# Patient Record
Sex: Female | Born: 1985
Health system: Southern US, Community
[De-identification: ages and names within clinical notes are randomized; demographics above are authoritative.]

## PROBLEM LIST (undated history)

## (undated) ENCOUNTER — Inpatient Hospital Stay (HOSPITAL_COMMUNITY): Payer: Self-pay

## (undated) DIAGNOSIS — K219 Gastro-esophageal reflux disease without esophagitis: Secondary | ICD-10-CM

## (undated) DIAGNOSIS — M797 Fibromyalgia: Secondary | ICD-10-CM

## (undated) DIAGNOSIS — M542 Cervicalgia: Secondary | ICD-10-CM

## (undated) DIAGNOSIS — R1310 Dysphagia, unspecified: Secondary | ICD-10-CM

## (undated) DIAGNOSIS — G8929 Other chronic pain: Secondary | ICD-10-CM

## (undated) DIAGNOSIS — G43909 Migraine, unspecified, not intractable, without status migrainosus: Secondary | ICD-10-CM

## (undated) DIAGNOSIS — G970 Cerebrospinal fluid leak from spinal puncture: Secondary | ICD-10-CM

## (undated) DIAGNOSIS — G2581 Restless legs syndrome: Secondary | ICD-10-CM

## (undated) DIAGNOSIS — E0590 Thyrotoxicosis, unspecified without thyrotoxic crisis or storm: Secondary | ICD-10-CM

## (undated) DIAGNOSIS — E041 Nontoxic single thyroid nodule: Secondary | ICD-10-CM

## (undated) DIAGNOSIS — R0981 Nasal congestion: Secondary | ICD-10-CM

## (undated) DIAGNOSIS — J398 Other specified diseases of upper respiratory tract: Secondary | ICD-10-CM

## (undated) DIAGNOSIS — M1990 Unspecified osteoarthritis, unspecified site: Secondary | ICD-10-CM

## (undated) DIAGNOSIS — R350 Frequency of micturition: Secondary | ICD-10-CM

## (undated) DIAGNOSIS — G629 Polyneuropathy, unspecified: Secondary | ICD-10-CM

## (undated) DIAGNOSIS — M549 Dorsalgia, unspecified: Secondary | ICD-10-CM

## (undated) DIAGNOSIS — F419 Anxiety disorder, unspecified: Secondary | ICD-10-CM

## (undated) HISTORY — PX: DG MYLEOGRAM LUMBAR SPINE (ARMC HX): HXRAD1576

## (undated) HISTORY — DX: Thyrotoxicosis, unspecified without thyrotoxic crisis or storm: E05.90

## (undated) HISTORY — PX: TONSILLECTOMY: SUR1361

---
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
## 2003-01-02 ENCOUNTER — Emergency Department (HOSPITAL_COMMUNITY): Admission: EM | Admit: 2003-01-02 | Discharge: 2003-01-02 | Payer: Self-pay | Admitting: Internal Medicine

## 2003-01-02 ENCOUNTER — Encounter: Payer: Self-pay | Admitting: Internal Medicine

## 2003-10-01 ENCOUNTER — Ambulatory Visit (HOSPITAL_COMMUNITY): Admission: RE | Admit: 2003-10-01 | Discharge: 2003-10-01 | Payer: Self-pay | Admitting: *Deleted

## 2003-12-09 ENCOUNTER — Ambulatory Visit (HOSPITAL_COMMUNITY): Admission: AD | Admit: 2003-12-09 | Discharge: 2003-12-09 | Payer: Self-pay | Admitting: *Deleted

## 2008-04-20 ENCOUNTER — Emergency Department (HOSPITAL_COMMUNITY): Admission: EM | Admit: 2008-04-20 | Discharge: 2008-04-20 | Payer: Self-pay | Admitting: Emergency Medicine

## 2008-12-02 ENCOUNTER — Emergency Department (HOSPITAL_COMMUNITY): Admission: EM | Admit: 2008-12-02 | Discharge: 2008-12-02 | Payer: Self-pay | Admitting: Emergency Medicine

## 2010-12-02 LAB — URINE MICROSCOPIC-ADD ON

## 2010-12-02 LAB — URINALYSIS, ROUTINE W REFLEX MICROSCOPIC
Bilirubin Urine: NEGATIVE
Glucose, UA: NEGATIVE mg/dL
Hgb urine dipstick: NEGATIVE
Ketones, ur: NEGATIVE mg/dL
Leukocytes, UA: NEGATIVE
Nitrite: POSITIVE — AB
Protein, ur: NEGATIVE mg/dL
Specific Gravity, Urine: >1.03 — ABNORMAL HIGH (ref 1.005–1.030)
Urobilinogen, UA: 0.2 mg/dL (ref 0.0–1.0)
pH: 5.5 (ref 5.0–8.0)

## 2010-12-02 LAB — PREGNANCY, URINE: Preg Test, Ur: NEGATIVE

## 2010-12-02 LAB — HEMOGLOBIN AND HEMATOCRIT, BLOOD
HCT: 39.9 % (ref 36.0–46.0)
Hemoglobin: 13.6 g/dL (ref 12.0–15.0)

## 2011-01-08 NOTE — Discharge Summary (Signed)
   NAME:  Linda Reid, Linda Reid                      ACCOUNT NO.:  [id number]   MEDICAL RECORD NO.:  [id number]                   PATIENT TYPE:  OBV   LOCATION:  A418                                 FACILITY:  [facility]   PHYSICIAN:  Langley Gauss, M.D.                DATE OF BIRTH:  August 19, 1986   DATE OF ADMISSION:  06/25/2002  DATE OF DISCHARGE:  06/26/2002                                 DISCHARGE SUMMARY   DIAGNOSES:  1. Pregnancy at 12 weeks.  2. Spontaneous incomplete abortion.  3. Dilatation and curettage performed on June 25, 2002.   DISPOSITION:  The patient postoperatively continued on in observation status  and discharged to home on June 26, 2002.   PERTINENT LABORATORY DATA:  Admission hemoglobin and hematocrit 11.4/32/7  with a white count of 13.4.  Following surgery and equilibration following  blood loss, hemoglobin 10.3, hematocrit 30.7, with a white count of 14.5.   HOSPITAL COURSE:  The patient presented June 25, 2002 with spontaneous  incomplete abortion to the emergency room.  The patient has had no prenatal  care during this pregnancy; however, I had cared for her during a prior  miscarriage.  Thus, I was contacted.  By the time I arrived the patient had  already passed the 12-week size fetus.  However, she did require dilatation  and curettage in the OR for the remaining products of conception.  Postoperatively the patient did very well after administration of 0.2 mg of  IM Methergine in the operating room.  She had no complaints of significant  vaginal bleeding.  She was treated with IV Ancef preoperatively.  Postoperatively the patient was continued on observation status.  She  tolerated a regular general diet, had no excess bleeding.  Subsequently, she  was discharged to home on June 26, 2002.                                               Langley Gauss, M.D.    DC/MEDQ  D:  06/26/2002  T:  06/28/2002  Job:  [id number]

## 2011-01-08 NOTE — Discharge Summary (Signed)
NAME:  Linda Reid, Linda Reid                      ACCOUNT NO.:  [id number]   MEDICAL RECORD NO.:  [id number]                   PATIENT TYPE:  OIB   LOCATION:  A415                                 FACILITY:  [facility]   PHYSICIAN:  Langley Gauss, M.D.                DATE OF BIRTH:  1985/10/30   DATE OF ADMISSION:  12/09/2003  DATE OF DISCHARGE:  12/09/2003                                 DISCHARGE SUMMARY   TIME:  2300 hours   An 25 year old, gravida 3, para 0, two prior spontaneous ABs, at [redacted] weeks  gestation with findings of premature cervical dilatation.  Arrangements were  made and the patient is to be transferred to [facility] at this time  to have available a level 3 facility.   The chest complaint was onset of pelvic pressure today with subsequent  complaints of uterine tightening. She denies any change in vaginal  discharge, any vaginal bleeding, or any leakage of fluid. The patient  describes in the history that she was most recently was staying with  relatives in [place], [place]. The patient states that earlier in the same  day she had come to [facility] with these same complaints. She states  that she was seen there by an unknown MD and was sent home with a  prescription for p.o. terbutaline which the pharmacy was unable to fill. The  patient subsequently went home to bedrest. She complained of continued  pelvic cramping and tightening, so she came to [facility] for evaluation.   The patient's past medical history, past social history, and family history  are reviewed. The entire prenatal record is reviewed. No change from  previous evaluation in labor and delivery and notes pertinently from  previous transfer to [facility]. The patient was recently transferred  to [facility], November 12, 2003, with dilatation at 1 cm. History at that time  was consistent with incompetent cervix. The patient was tocolyzed there with  Indomethacin, hospitalized times two weeks  duration, and then was discharged  to home and placed at bedrest. I was notified by [facility] that the  patient was being discharged. The patient has not been seen for evaluation  since that discharge and this is my first contact with her since sending her  to [place].   PHYSICAL EXAMINATION:  The patient was complaining of pelvic pressure and  uterine tightening. Temperature 97.9, pulse 96, respiratory rate 19, blood  pressure 120/62. HEENT revealed the neck to be supple. Thyroid not palpable.  No significant adenopathy.  Lungs were clear. Cardiovascular exam revealed a  regular rate and rhythm. The abdomen was soft and nontender. The uterus was  nontender. A gravid uterus identified with a fundal height of 0.8 cm. Vertex  presentation by Leopold's maneuvers. Cervix 3 cm dilated, 70% effaced,  intact palpable membranes. The vertex was noted to be nonengaged. External  fetal monitor revealed fetal heart rate in the 150s. No  fetal heart rate  decelerations were noted. There was a rare uterine contraction seen by  external fetal monitor.   LABORATORY STUDIES:  None.   ASSESSMENT:  Premature cervical dilatation with occasional uterine  contractions now present.  The patient is at very high risk for preterm  delivery. The history previously noted with this patient has had history of  incompetent cervix. She has had two prior first trimester losses and has had  a dilatation and curettage performed.   PLAN:  [facility] spoke with Dr. Conni Slipper, and  arrangements are made to transfer the patient there for availability of the  level 3 nursery.     ___________________________________________                                         Langley Gauss, M.D.   DC/MEDQ  D:  12/15/2003  T:  12/16/2003  Job:  [id number]

## 2011-01-08 NOTE — Discharge Summary (Signed)
[facility]  Patient:    Linda Reid, Linda Reid Visit Number: [id number] MRN: [id number]          Service Type: OBV Location: LDR LDR2 01 Attending Physician:  Jeri Cos. Dictated by:   Langley Gauss, M.D. Admit Date:  01/10/2002 Discharge Date: 01/11/2002                             Discharge Summary  OBSERVATION STATUS  DIAGNOSIS:  Ten- to 12-week intrauterine pregnancy, presenting to the emergency room with diagnosis of threatened abortion.  During her observation status, the patient was noted to undergo a complete spontaneous abortion on the fourth floor.  HISTORY OF PRESENT ILLNESS:  [age]-year-old gravida 1, para 0, with a last menstrual period sometime the first part of March, but frequent skipped menses, who presented to [facility] Room as an unassigned patient. The patient was noted to have had a positive urine pregnancy test two to four weeks prior at an emergency room visit.  She now presented to the emergency room the p.m. of Jan 10, 2002 with the onset of spotting, changing to bright red bleeding on the date of evaluation.  The patient pertinently had been seen two to four weeks previously in the emergency room for back pain, at which time she was noted to have the initial positive urine pregnancy test.  She undoubtedly was given plans to follow up with a local practitioner.  She had not arranged for any plans for followup, thus she continues to be an unassigned patient and presents to the emergency room.  The patient was evaluated by the emergency room physician, at which time she was noted to have probably a miscarriage in progress.  I was contacted by phone and gave orders for the patient to be referred to the fourth floor for continued observation.  PAST MEDICAL HISTORY:  She has no other medical or surgical history.  ALLERGIES:  She has no known drug allergies.  CURRENT MEDICATIONS:  None.  PHYSICAL  EXAMINATION:  Black female in no acute distress.  Height is 5 feet 9 inches.  Weight is 138.  Blood pressure 131/52, pulse rate of 80, respiratory rate is 20.  HEENT:  Negative with no adenopathy.  NECK:  Supple. Thyroid is nonpalpable.  Mucous membranes are moist.  LUNGS:  Clear. CARDIOVASCULAR:  Regular rate and rhythm.  ABDOMEN:  Soft and nontender with no surgical scars identified.  No abdominal or pelvic masses are appreciated. PELVIC:  Exam reveals blood on the inner thighs, red in color with no clotting.  LABORATORY AND ACCESSORY DATA:  A transvaginal ultrasound had been performed on Jan 10, 2002 in the p.m. by the radiology staff which revealed no intrauterine pregnancy seen.  There was a clot in the lower uterine segment, no free fluid within the pelvis.  Diagnostic impression of the radiologist was that of miscarriage in progress, suggests clinical correlation.  The patient was noted to be Rh-positive blood type.  Quantitative beta hCG of 3685.  It was at this point that I was contacted for continued care and management.  The patient was transferred to the fourth floor for evaluation; this was on Jan 10, 2002.  HOSPITAL COURSE:  The patient was observed throughout the evening.  She continued to have mild cramping after performance of the transvaginal ultrasound but not significant; she was treated only with a single dose of 10 mg of p.o. Ambien.  The  patient continued to have light vaginal bleeding throughout the evening with minimal discomfort.  I then evaluated the patient on Jan 11, 2002 for necessity or proceeding with dilatation and curettage.  At that time, the patient was in no acute distress.  She has been up and ambulatory with only moderate vaginal bleeding, no passage of tissue, no additional passage of any clots.  She has complained of only mild cramping. Sterile speculum exam is performed which reveals a large piece of placental-appearing tissue which is within  the vagina itself.  This is removed in an intact manner and carefully examined.  Pertinently, no additional tissue is identified separate from this single portion removed and there is no tissue visible at the endocervical os.  Careful examination of this tissue reveals the attached placental trophoblastic-type tissue; likewise, a fluid-filled sac about 3 cm in diameter is identified which, upon careful examination, appears to contain a very ill-defined, poorly formed early embryo.  This is sent for permanent specimen only.  Examination of the genital tract reveals minimal vaginal bleeding, the cramping has completely subsided, tissue appears to have passed completely, thus the patient is continued on an observation status. She was treated with 0.2 mg of IM Methergine to involute the uterus; she, thereafter, had minimal vaginal bleeding, complained of cramping only after the IM Methergine, which was easily treated with IV Buprenex.  The patient was observed times several hours duration on the fourth floor.  She had no exacerbation of her bleeding and no significant cramping, thus final impression was that of complete spontaneous Ab, thus the patient is discharged to home on Jan 11, 2002.  FOLLOWUP:  She will follow up in one weeks time for discussion of birth control and future fertility options. Dictated by:   Langley Gauss, M.D. Attending Physician:  Jeri Cos. DD:  01/12/02 TD:  01/16/02 Job: [id number]

## 2011-01-08 NOTE — H&P (Signed)
   NAME:  Linda Reid, SCHWEBACH                      ACCOUNT NO.:  [id number]   MEDICAL RECORD NO.:  [id number]                   PATIENT TYPE:  OBV   LOCATION:  A418                                 FACILITY:  [facility]   PHYSICIAN:  Langley Gauss, M.D.                DATE OF BIRTH:  11/08/1985   DATE OF ADMISSION:  06/25/2002  DATE OF DISCHARGE:  06/26/2002                                HISTORY & PHYSICAL   HISTORY OF PRESENT ILLNESS:  This is an 25 year old gravida 2, para 0 with a  last menstrual period uncertain but around 04/06/02.  The patient has had no  prenatal care to date.  She presents to [facility] Emergency Room by  personal vehicle, complaining of onset of vaginal bleeding that a.m.  The  patient was aware that she was pregnant; had a positive pregnancy test two  weeks previously, but has received no prenatal care to date.   PAST MEDICAL HISTORY:  She had one prior spontaneous abortion, first  trimester, and not requiring D&C.  She had prior tonsillectomy and  adenoidectomy procedure performed.   ALLERGIES:  None.   CURRENT MEDICATIONS:  Prenatal vitamins.   PAST SURGICAL HISTORY:  No other hospitalizations; no other medical or  surgical history.   PHYSICAL EXAMINATION:  VITAL SIGNS:  Height:  5' 9, weight 130 lbs., blood  pressure 135/71, pulse 109, temperature 99, respirations 20.  HEENT:  Negative; no adenopathy.  NECK:  Supple; thyroid is nonpalpable.  LUNGS:  Clear.  CARDIOVASCULAR:  Sinus regular rate and rhythm.  ABDOMEN:  Soft and nontender; slightly enlarged uterus identified; no  significant pelvic masses.  The ovaries are nonpalpable.  PELVIC:  Prior to my arriving in the emergency room, the patient had  spontaneously passed what appeared to be a 12-week fetus.  This was sent off  for permanent specimen by the emergency room physician.  I was there after  consulting, and I consulted for continuing care, at which time I evaluated  the patient.  She was noted  to have moderate vaginal bleeding with some  clots present to posterior vaginal fornix, cervix noted to be dilated by 1  cm.; uterus noted to be 10 cm.   LABORATORY DATA:  Hemoglobin 11.   ASSESSMENT:  Twelve-week intrauterine pregnancy by best estimate.  The  patient has passed the fetus spontaneously; however, there are large amounts  of gestational tissue which remains within the uterine cavity; thus, the  patient was prepped to proceed with dilatation and curettage on 06/25/02.                                              Langley Gauss, M.D.   DC/MEDQ  D:  06/26/2002  T:  06/26/2002  Job:  [id number]

## 2011-01-08 NOTE — Op Note (Signed)
   NAME:  Linda Reid, Linda Reid                      ACCOUNT NO.:  [id number]   MEDICAL RECORD NO.:  [id number]                   PATIENT TYPE:  OBV   LOCATION:  A418                                 FACILITY:  [facility]   PHYSICIAN:  Langley Gauss, M.D.                DATE OF BIRTH:  [birth year]   DATE OF PROCEDURE:  06/25/2002  DATE OF DISCHARGE:  06/26/2002                                 OPERATIVE REPORT   PREOPERATIVE DIAGNOSES:  1. Spontaneous incomplete abortion [redacted] weeks gestation.  2. Insufficient prenatal care with no prenatal care to date.   POSTOPERATIVE DIAGNOSES:  Not given.   PROCEDURE:  Dilatation and curettage.   SURGEON:  Langley Gauss, M.D.   ESTIMATED BLOOD LOSS:  100 cc.   SPECIMENS:  Obvious products of conception to pathology for permanent  section only.   ANESTHESIA:  General endotracheal.   DESCRIPTION OF PROCEDURE:  The patient was taken to the operating room,  vital signs were stable. The patient underwent uncomplicated induction of  general anesthesia after which time she was placed in the dorsal lithotomy  position. A speculum was then used to visualize the vaginal vault. The  patient was sterilely prepped and draped in the usual manner. The cervix is  noted to be 1 cm dilated, clots are removed from the vagina as well as the  cervical os. Very gentle dilatation is then performed up to a size #19  dilator which allows passage of a banjo curette through the endocervix and  into the uterine cavity itself. The uterine cavity was then aggressively  curettaged with findings of a large amount of products of conception  obtained. No evidence of any irregularity of the anterior uterine contour or  any synechiae identified. The procedure was continued until no further  products of conception are obtained. A fine gritty sensation is appreciated  in all quadrants of the uterus to include the uterine fundus. The specimen  was then handed off for a permanent specimen  only. Bimanual examination  revealed a diffusely enlarged uterus 10 week size, cervix dilated 1 cm.  Patient treated at this time with 0.2 mg of IM methargen. Total estimated  blood loss less than 100 cc. The patient was then reversed of anesthesia at  which time operative findings discussed with the patient's awaiting family.                                               Langley Gauss, M.D.    DC/MEDQ  D:  06/26/2002  T:  06/27/2002  Job:  [id number]

## 2011-01-08 NOTE — Discharge Summary (Signed)
NAME:  Linda Reid, Linda Reid                      ACCOUNT NO.:  [id number]   MEDICAL RECORD NO.:  [id number]                   PATIENT TYPE:   LOCATION:                                       FACILITY:  [facility]   PHYSICIAN:  Langley Gauss, M.D.                DATE OF BIRTH:  31-Jul-1986   DATE OF ADMISSION:  DATE OF DISCHARGE:                                 DISCHARGE SUMMARY   TRANSFER SUMMARY:   HISTORY OF PRESENT ILLNESS:  This is a 25 year old gravida 3, para 0 who is  at 24-4/[redacted] weeks gestation.  Her last menstrual period is unreliable, but the  [facility] is based upon a 9-week crown rump length.  In addition, the patient had  an ultrasound performed at [facility] for anatomic survey which  confirmed this current estimated gestational age.  The patient came in the  office today for scheduled prenatal visit.  We had planned on performing a  Pap smear.  In addition, a GC and chlamydia culture were to be performed.  At the time of that examination, the patient was noted to be 1 cm dilated  with intact membranes visible at the cervix.  They were noted not to be  hourglassing.  GC and chlamydia cultures were obtained.  The patient had an  ultrasound done and arrangement is made for transfer.   LABORATORY DATA:  The patient's prenatal course, she is noted to be AB  positive blood type.  As stated previously, she had an ultrasound performed  at [facility] at [redacted] weeks gestation which revealed a normal  anatomic survey.  Breech presentation at that time, singleton pregnancy.  The cervix was measured and noted to have a length of 3.7 cm.  The remainder  of the patient's laboratory studies were within normal limits to include  normal maternal serum AFB screening.  Due to the patient's prior history of  two prior 12-week estimated gestational ages losses, the patient was treated  during the first trimester with a baby aspirin one p.o. day. In addition,  progesterone suppositories, 25  mg intravaginally b.i.d.  These were  discontinued at about [redacted] weeks gestation.   The patient pertinently has no complaints of pelvic cramping, pelvic  pressure, pelvic pain.  No history of menstrual type cramps.  No change in  vaginal discharge.  Specifically, no bleeding, no leakage of fluids.  No  brown discharge.  No increased mucus.  The patient was noted to have  negative GC and chlamydia cultures.   PAST MEDICAL HISTORY:  She was noted to have a positive chlamydia culture  September 24, 2002.  She was not pregnant at that time.  She was treated with  doxycycline 100 mg p.o. b.i.d. x7 as was her partner.  Subsequently, she has  been followed empirically as a high-risk patient.  She does have documented  negative GC and chlamydia cultures performed July 01, 2003.  These were  to be repeated again today.   ALLERGIES:  She has no known drug allergies.   CURRENT MEDICATIONS:  Prenatal  vitamins only.   PRIOR HISTORY:  Prior history June 25, 2002, the patient presented to  [facility] emergency room with no prenatal care at that time.  She was noted  to be a gravida 2, para 0.  She passed a gestational sac consistent with 12  weeks pregnancy, after significant amount of cramping, pain, discomfort and  some bleeding.  There remained tissue within the uterus.  Thus, a dilatation  and curettage was performed on June 25, 2002.  The operative procedure  and postoperative course were without complication.   The patient had a prior history of Jan 10, 2002.  The patient at that point  in time, had presented to the emergency room with no prenatal care, 10-[redacted]  weeks gestation.  The patient was noted to pass completely an intact  gestational sac, and placental-appearing tissue.  The patient was observed  overnight, had no further bleeding, and was discharged to home following  diagnosis of a complete spontaneous AB.  She did not receive a D&C during  that hospital visit.   SOCIAL  HISTORY:  The patient is a nonsmoker.  She is currently unemployed.  She has been restricting her activities largely to modified bed rest  throughout this pregnancy.   PHYSICAL EXAMINATION:  GENERAL:  The patient is in no acute distress.  VITAL SIGNS:  Weight 148, blood pressure 115/63, pulse rate 80, respiratory  rate 20.  HEENT:  Negative.  No adenopathy.  NECK:  Supple.  Thyroid is nonpalpable.  LUNGS:  Clear.  CARDIOVASCULAR:  Exam is a regular rate and rhythm.  ABDOMEN:  Soft, nontender.  Old surgical scars are identified.  She is  vertex presentation by Thayer Ohm maneuver.  Fetal heart tones were auscultated  in the 150's.  EXTREMITIES:  Noted to be normal.  PELVIC EXAM:  Normal external genitalia. No leakage of fluid.  No vaginal  bleeding.  No lesions are identified.  Sterile speculum examination is  performed.  We performed a Pap smear at which time, the cervix is noted to  be about 3 cm in length, but visually 1 cm dilated.  There are intact  membranes visible, but certainly these are not hourglassing, but are at  about the level of the internal cervical os.  GC and chlamydia cultures are  performed as well as Pap smear, and the speculum is withdrawn.  A limited OB  ultrasound, greater than [redacted] weeks gestation, is performed by Dr. Langley Gauss which reveals femoral length and abdominal circumference consistent  with 23 to 24-1/[redacted] weeks gestation.  BPD is measured at 26 weeks estimated  gestational age.  Subjectively, normal amniotic fluid volume, good fetal  movement is identified, as is fetal cardiac activity.  Placenta is noted to  be anterior in location, but not low lying.  There does appear to be some  opening of the internal cervical os measured at 1.9 cm in maximum width.   ASSESSMENT:  1. The patient with two prior 12-week losses which were associated with a     significant amount of cramping, and history was completely consistent    with 12-week spontaneous  abortion.  The patient was seen today for a     routine visit only, at which time she is noted to have cervix dilated 1     cm with no complaints of  uterine contraction.  Thus, consistent with a     diagnosis of incompetent cervix.  The patient is noted to be at the     threshold of viability at 24-4/[redacted] weeks gestation.  2. Thus, discussed with her transfer to [facility] Hospital, at which time     continuing care can be provided.  The patient is aware that at this     gestational age, should delivery occur, efforts will be made toward     resuscitation and stabilization of the infant.  She is, however, aware     that this is extreme prematurity.  3. I did discuss with her, however, that the longer the pregnancy     progresses, the better outcome can be expected.     ___________________________________________                                         Langley Gauss, M.D.   DC/MEDQ  D:  11/12/2003  T:  11/13/2003  Job:  [id number]

## 2014-08-23 NOTE — L&D Delivery Note (Signed)
Delivery Note Linda Reid is a 29 y.o. female [id number] with an uncomplicated IUP at [redacted]w[redacted]d who presented on 06/20/2015 for spontaneous onset of labor. She was assessed to be 7 cm on arrival, 9 cm by the time she arrived to the floor. She was subsequently admitted to the Labor & Delivery unit, and her membranes were artificially ruptured. She progressed in active labor without issue or any additional augmentation. At 1314 she delivered a healthy baby girl via uncomplicated spontaneous vaginal delivery (LOA). Cord clamping was delayed and third stage of labor proceeded as expected with spontaneous delivery of the placenta and subsequent hemostasis.  APGAR (1 MIN): 8   APGAR (5 MINS): 9   Weight: 7 lb 13.8 oz (3565 g) Placenta status: Spontaneous, intact Cord: 3 vessel Complications: none  Anesthesia: None Episiotomy: None Lacerations: R 1st degree Labial; L 2nd degree periurethral  Suture Repair: 4.0 vicryl Est. Blood Loss (mL): 225 cc   Mom to postpartum.  Baby to Couplet care / Skin to Skin.  Keane Scrape, MD 06/20/2015 1:50 PM   I was gloved and present for entire delivery Agree with note No difficulty with shoulders I supervised periurethral repair. No complications Seabron Spates, CNM

## 2014-11-14 ENCOUNTER — Other Ambulatory Visit: Payer: Self-pay | Admitting: *Deleted

## 2014-11-14 DIAGNOSIS — O3680X0 Pregnancy with inconclusive fetal viability, not applicable or unspecified: Secondary | ICD-10-CM

## 2014-11-18 ENCOUNTER — Ambulatory Visit (INDEPENDENT_AMBULATORY_CARE_PROVIDER_SITE_OTHER): Payer: Medicaid Other

## 2014-11-18 DIAGNOSIS — O3680X0 Pregnancy with inconclusive fetal viability, not applicable or unspecified: Secondary | ICD-10-CM | POA: Diagnosis not present

## 2014-11-18 NOTE — Progress Notes (Signed)
[name] :single IUP pos fht 151 bpm, [redacted]w[redacted]d,edd 07/02/2015,post pl,normal rt ov,lt corpus luteal cyst 2.5cm

## 2014-12-02 ENCOUNTER — Ambulatory Visit (INDEPENDENT_AMBULATORY_CARE_PROVIDER_SITE_OTHER): Payer: Medicaid Other | Admitting: Women's Health

## 2014-12-02 ENCOUNTER — Encounter: Payer: Self-pay | Admitting: Women's Health

## 2014-12-02 VITALS — BP 120/64 | HR 88 | Ht 69.0 in | Wt 167.0 lb

## 2014-12-02 DIAGNOSIS — O09219 Supervision of pregnancy with history of pre-term labor, unspecified trimester: Secondary | ICD-10-CM

## 2014-12-02 DIAGNOSIS — Z331 Pregnant state, incidental: Secondary | ICD-10-CM

## 2014-12-02 DIAGNOSIS — O09211 Supervision of pregnancy with history of pre-term labor, first trimester: Secondary | ICD-10-CM

## 2014-12-02 DIAGNOSIS — Z3682 Encounter for antenatal screening for nuchal translucency: Secondary | ICD-10-CM

## 2014-12-02 DIAGNOSIS — Z3491 Encounter for supervision of normal pregnancy, unspecified, first trimester: Secondary | ICD-10-CM

## 2014-12-02 DIAGNOSIS — Z369 Encounter for antenatal screening, unspecified: Secondary | ICD-10-CM

## 2014-12-02 DIAGNOSIS — O09891 Supervision of other high risk pregnancies, first trimester: Secondary | ICD-10-CM

## 2014-12-02 DIAGNOSIS — O09899 Supervision of other high risk pregnancies, unspecified trimester: Secondary | ICD-10-CM | POA: Insufficient documentation

## 2014-12-02 DIAGNOSIS — Z3490 Encounter for supervision of normal pregnancy, unspecified, unspecified trimester: Secondary | ICD-10-CM | POA: Insufficient documentation

## 2014-12-02 DIAGNOSIS — Z1389 Encounter for screening for other disorder: Secondary | ICD-10-CM

## 2014-12-02 LAB — POCT URINALYSIS DIPSTICK
GLUCOSE UA: NEGATIVE
Ketones, UA: NEGATIVE
Leukocytes, UA: NEGATIVE
Nitrite, UA: NEGATIVE
Protein, UA: NEGATIVE

## 2014-12-02 MED ORDER — DOXYLAMINE-PYRIDOXINE 10-10 MG PO TBEC: DELAYED_RELEASE_TABLET | ORAL | Status: DC

## 2014-12-02 NOTE — Patient Instructions (Signed)
Nausea & Vomiting  Have saltine crackers or pretzels by your bed and eat a few bites before you raise your head out of bed in the morning  Eat small frequent meals throughout the day instead of large meals  Drink plenty of fluids throughout the day to stay hydrated, just don't drink a lot of fluids with your meals.  This can make your stomach fill up faster making you feel sick  Do not brush your teeth right after you eat  Products with real ginger are good for nausea, like ginger ale and ginger hard candy Make sure it says made with real ginger!  Sucking on sour candy like lemon heads is also good for nausea  If your prenatal vitamins make you nauseated, take them at night so you will sleep through the nausea  Sea Bands  If you feel like you need medicine for the nausea & vomiting please let us know  If you are unable to keep any fluids or food down please let us know  For Dizzy Spells:   This is usually related to either your blood sugar or your blood pressure dropping  Make sure you are staying well hydrated and drinking enough water so that your urine is clear  Eat small frequent meals and snacks containing protein (meat, eggs, nuts, cheese) so that your blood sugar doesn't drop  If you do get dizzy, sit/lay down and get you something to drink and a snack containing protein- you will usually start feeling better in 10-20 minutes      First Trimester of Pregnancy The first trimester of pregnancy is from week 1 until the end of week 12 (months 1 through 3). A week after a sperm fertilizes an egg, the egg will implant on the wall of the uterus. This embryo will begin to develop into a baby. Genes from you and your partner are forming the baby. The female genes determine whether the baby is a boy or a girl. At 6-8 weeks, the eyes and face are formed, and the heartbeat can be seen on ultrasound. At the end of 12 weeks, all the baby's organs are formed.  Now that you are pregnant, you  will want to do everything you can to have a healthy baby. Two of the most important things are to get good prenatal care and to follow your health care provider's instructions. Prenatal care is all the medical care you receive before the baby's birth. This care will help prevent, find, and treat any problems during the pregnancy and childbirth. BODY CHANGES Your body goes through many changes during pregnancy. The changes vary from woman to woman.  15. You may gain or lose a couple of pounds at first. 16. You may feel sick to your stomach (nauseous) and throw up (vomit). If the vomiting is uncontrollable, call your health care provider. 17. You may tire easily. 71. You may develop headaches that can be relieved by medicines approved by your health care provider. 19. You may urinate more often. Painful urination may mean you have a bladder infection. 20. You may develop heartburn as a result of your pregnancy. 21. You may develop constipation because certain hormones are causing the muscles that push waste through your intestines to slow down. 22. You may develop hemorrhoids or swollen, bulging veins (varicose veins). 23. Your breasts may begin to grow larger and become tender. Your nipples may stick out more, and the tissue that surrounds them (areola) may become darker. 24. Your gums  may bleed and may be sensitive to brushing and flossing. 25. Dark spots or blotches (chloasma, mask of pregnancy) may develop on your face. This will likely fade after the baby is born. 26. Your menstrual periods will stop. 27. You may have a loss of appetite. 28. You may develop cravings for certain kinds of food. 39. You may have changes in your emotions from day to day, such as being excited to be pregnant or being concerned that something may go wrong with the pregnancy and baby. 37. You may have more vivid and strange dreams. 31. You may have changes in your hair. These can include thickening of your hair,  rapid growth, and changes in texture. Some women also have hair loss during or after pregnancy, or hair that feels dry or thin. Your hair will most likely return to normal after your baby is born. WHAT TO EXPECT AT YOUR PRENATAL VISITS During a routine prenatal visit:  You will be weighed to make sure you and the baby are growing normally.  Your blood pressure will be taken.  Your abdomen will be measured to track your baby's growth.  The fetal heartbeat will be listened to starting around week 10 or 12 of your pregnancy.  Test results from any previous visits will be discussed. Your health care provider may ask you:  How you are feeling.  If you are feeling the baby move.  If you have had any abnormal symptoms, such as leaking fluid, bleeding, severe headaches, or abdominal cramping.  If you have any questions. Other tests that may be performed during your first trimester include:  Blood tests to find your blood type and to check for the presence of any previous infections. They will also be used to check for low iron levels (anemia) and Rh antibodies. Later in the pregnancy, blood tests for diabetes will be done along with other tests if problems develop.  Urine tests to check for infections, diabetes, or protein in the urine.  An ultrasound to confirm the proper growth and development of the baby.  An amniocentesis to check for possible genetic problems.  Fetal screens for spina bifida and Down syndrome.  You may need other tests to make sure you and the baby are doing well. HOME CARE INSTRUCTIONS  Medicines  Follow your health care provider's instructions regarding medicine use. Specific medicines may be either safe or unsafe to take during pregnancy.  Take your prenatal vitamins as directed.  If you develop constipation, try taking a stool softener if your health care provider approves. Diet  Eat regular, well-balanced meals. Choose a variety of foods, such as meat  or vegetable-based protein, fish, milk and low-fat dairy products, vegetables, fruits, and whole grain breads and cereals. Your health care provider will help you determine the amount of weight gain that is right for you.  Avoid raw meat and uncooked cheese. These carry germs that can cause birth defects in the baby.  Eating four or five small meals rather than three large meals a day may help relieve nausea and vomiting. If you start to feel nauseous, eating a few soda crackers can be helpful. Drinking liquids between meals instead of during meals also seems to help nausea and vomiting.  If you develop constipation, eat more high-fiber foods, such as fresh vegetables or fruit and whole grains. Drink enough fluids to keep your urine clear or pale yellow. Activity and Exercise  Exercise only as directed by your health care provider. Exercising will help  you:  Control your weight.  Stay in shape.  Be prepared for labor and delivery.  Experiencing pain or cramping in the lower abdomen or low back is a good sign that you should stop exercising. Check with your health care provider before continuing normal exercises.  Try to avoid standing for long periods of time. Move your legs often if you must stand in one place for a long time.  Avoid heavy lifting.  Wear low-heeled shoes, and practice good posture.  You may continue to have sex unless your health care provider directs you otherwise. Relief of Pain or Discomfort  Wear a good support bra for breast tenderness.   Take warm sitz baths to soothe any pain or discomfort caused by hemorrhoids. Use hemorrhoid cream if your health care provider approves.   Rest with your legs elevated if you have leg cramps or low back pain.  If you develop varicose veins in your legs, wear support hose. Elevate your feet for 15 minutes, 3-4 times a day. Limit salt in your diet. Prenatal Care  Schedule your prenatal visits by the twelfth week of  pregnancy. They are usually scheduled monthly at first, then more often in the last 2 months before delivery.  Write down your questions. Take them to your prenatal visits.  Keep all your prenatal visits as directed by your health care provider. Safety  Wear your seat belt at all times when driving.  Make a list of emergency phone numbers, including numbers for family, friends, the hospital, and police and fire departments. General Tips  Ask your health care provider for a referral to a local prenatal education class. Begin classes no later than at the beginning of month 6 of your pregnancy.  Ask for help if you have counseling or nutritional needs during pregnancy. Your health care provider can offer advice or refer you to specialists for help with various needs.  Do not use hot tubs, steam rooms, or saunas.  Do not douche or use tampons or scented sanitary pads.  Do not cross your legs for long periods of time.  Avoid cat litter boxes and soil used by cats. These carry germs that can cause birth defects in the baby and possibly loss of the fetus by miscarriage or stillbirth.  Avoid all smoking, herbs, alcohol, and medicines not prescribed by your health care provider. Chemicals in these affect the formation and growth of the baby.  Schedule a dentist appointment. At home, brush your teeth with a soft toothbrush and be gentle when you floss. SEEK MEDICAL CARE IF:   You have dizziness.  You have mild pelvic cramps, pelvic pressure, or nagging pain in the abdominal area.  You have persistent nausea, vomiting, or diarrhea.  You have a bad smelling vaginal discharge.  You have pain with urination.  You notice increased swelling in your face, hands, legs, or ankles. SEEK IMMEDIATE MEDICAL CARE IF:   You have a fever.  You are leaking fluid from your vagina.  You have spotting or bleeding from your vagina.  You have severe abdominal cramping or pain.  You have rapid  weight gain or loss.  You vomit blood or material that looks like coffee grounds.  You are exposed to [name] measles and have never had them.  You are exposed to fifth disease or chickenpox.  You develop a severe headache.  You have shortness of breath.  You have any kind of trauma, such as from a fall or a car accident. Document  Released: 08/03/2001 Document Revised: 12/24/2013 Document Reviewed: 06/19/2013 [facility] Patient Information 2015 [place], [place]. This information is not intended to replace advice given to you by your health care provider. Make sure you discuss any questions you have with your health care provider.

## 2014-12-02 NOTE — Progress Notes (Signed)
  Subjective:  Linda Reid is a 29 y.o. [phone] African American female at [redacted]w[redacted]d by 7wk u/s, being seen today for her first obstetrical visit.  Her obstetrical history is significant for h/o ptb @ 25wks d/t PPROM, then 2 subsequent uncomplicated full term births.  Pregnancy history fully reviewed.  Patient reports n/v- requests meds, jittery feeling occ. Not eating well d/t n/v. Denies vb, cramping, uti s/s, abnormal/malodorous vag d/c, or vulvovaginal itching/irritation.  BP 120/64 mmHg  Pulse 88  Wt 167 lb (75.751 kg)  LMP 09/19/2014 (Exact Date)  HISTORY: OB History  Gravida Para Term Preterm AB SAB TAB Ectopic Multiple Living  6 3 2 1 2 2    3     # Outcome Date GA Lbr Len/2nd Weight Sex Delivery Anes PTL Lv  6 Current           5 Term 12/07/06 [redacted]w[redacted]d  7 lb 14 oz (3.572 kg) M Vag-Spont EPI N Y  4 Term 01/23/05 [redacted]w[redacted]d  7 lb 6 oz (3.345 kg) M Vag-Spont EPI N Y  3 Preterm 12/11/03 [redacted]w[redacted]d  2 lb (0.907 kg) F Vag-Spont EPI Y Y  2 SAB           1 SAB              History reviewed. No pertinent past medical history. Past Surgical History  Procedure Laterality Date  . Tonsillectomy     Family History  Problem Relation Age of Onset  . Diabetes Maternal Aunt   . Hypertension Maternal Aunt   . Stroke Maternal Aunt   . Diabetes Maternal Uncle   . Hypertension Maternal Uncle   . Hypertension Paternal Aunt   . Hypertension Paternal Uncle   . Cancer Maternal Grandmother     breast  . Diabetes Maternal Grandmother   . Hypertension Maternal Grandmother   . Heart disease Maternal Grandfather   . Hypertension Maternal Grandfather   . Hypertension Paternal Grandmother   . Hypertension Paternal Grandfather     Exam   System:     General: Well developed & nourished, no acute distress   Skin: Warm & dry, normal coloration and turgor, no rashes   Neurologic: Alert & oriented, normal mood   Cardiovascular: Regular rate & rhythm   Respiratory: Effort & rate normal, LCTAB, acyanotic    Abdomen: Soft, non tender   Extremities: normal strength, tone  Thin prep pap smear 2015 in [place]- neg per pt  FHR: 163 via doppler   Assessment:   Pregnancy: [id number] Patient Active Problem List   Diagnosis Date Noted  . Supervision of normal pregnancy 12/02/2014    Priority: High  . History of preterm delivery, currently pregnant 12/02/2014    Priority: High    [redacted]w[redacted]d [id number] New OB visit N/v of pregnancy H/o spontaneous ptb d/t pprom at 25wks   Plan:  Initial labs drawn Continue prenatal vitamins Problem list reviewed and updated Reviewed n/v relief measures and warning s/s to report Rx diclegis, prior auth sent, and 2 samples given.  Reviewed recommended weight gain based on pre-gravid BMI Encouraged well-balanced diet Genetic Screening discussed Integrated Screen: requested Cystic fibrosis screening discussed requested Ultrasound discussed; fetal survey: requested Follow up in 3 weeks for 1st it/nt and visit [place] [place] completed Liliane Bade, accepted, ordered today  Tawnya Crook CNM, [facility] 12/02/2014 12:24 PM

## 2014-12-04 ENCOUNTER — Telehealth: Payer: Self-pay | Admitting: Women's Health

## 2014-12-04 ENCOUNTER — Encounter: Payer: Self-pay | Admitting: Women's Health

## 2014-12-04 DIAGNOSIS — O98811 Other maternal infectious and parasitic diseases complicating pregnancy, first trimester: Secondary | ICD-10-CM

## 2014-12-04 DIAGNOSIS — A749 Chlamydial infection, unspecified: Secondary | ICD-10-CM | POA: Insufficient documentation

## 2014-12-04 LAB — GC/CHLAMYDIA PROBE AMP
CHLAMYDIA, DNA PROBE: POSITIVE — AB
Neisseria gonorrhoeae by PCR: NEGATIVE

## 2014-12-04 LAB — URINE CULTURE: Organism ID, Bacteria: NO GROWTH

## 2014-12-04 MED ORDER — AZITHROMYCIN 500 MG PO TABS: 1000.0000 mg | ORAL_TABLET | Freq: Once | ORAL | Status: DC

## 2014-12-04 NOTE — Telephone Encounter (Signed)
Notified pt of +CT, rx sent to her pharmacy. Rx for partner Amylia Collazos, dob 02/11/84, nkda, sent to [facility]. No sex x at least 7d from both taking meds. Will do POC on pt in 4wks.  Roma Schanz, CNM, WHNP-BC 12/04/2014 2:30 PM

## 2014-12-06 LAB — PMP SCREEN PROFILE (10S), URINE
Amphetamine Screen, Ur: NEGATIVE ng/mL
Barbiturate Screen, Ur: NEGATIVE ng/mL
Benzodiazepine Screen, Urine: NEGATIVE ng/mL
COCAINE(METAB.) SCREEN, URINE: NEGATIVE ng/mL
Cannabinoids Ur Ql Scn: NEGATIVE ng/mL
Creatinine(Crt), U: 248.7 mg/dL (ref 20.0–300.0)
METHADONE SCREEN, URINE: NEGATIVE ng/mL
OPIATE SCRN UR: NEGATIVE ng/mL
OXYCODONE+OXYMORPHONE UR QL SCN: NEGATIVE ng/mL
PCP Scrn, Ur: NEGATIVE ng/mL
PROPOXYPHENE SCREEN: NEGATIVE ng/mL
Ph of Urine: 7.1 (ref 4.5–8.9)

## 2014-12-06 LAB — ABO/RH: Rh Factor: POSITIVE

## 2014-12-06 LAB — RUBELLA SCREEN: Rubella Antibodies, IGG: 1.07 index (ref >0.99)

## 2014-12-06 LAB — CBC
HEMATOCRIT: 40.4 % (ref 34.0–46.6)
HEMOGLOBIN: 13.2 g/dL (ref 11.1–15.9)
MCH: 30.6 pg (ref 26.6–33.0)
MCHC: 32.7 g/dL (ref 31.5–35.7)
MCV: 94 fL (ref 79–97)
Platelets: 264 10*3/uL (ref 150–379)
RBC: 4.31 x10E6/uL (ref 3.77–5.28)
RDW: 13.2 % (ref 12.3–15.4)
WBC: 8.9 10*3/uL (ref 3.4–10.8)

## 2014-12-06 LAB — ANTIBODY SCREEN: ANTIBODY SCREEN: NEGATIVE

## 2014-12-06 LAB — URINALYSIS, ROUTINE W REFLEX MICROSCOPIC
Bilirubin, UA: NEGATIVE
GLUCOSE, UA: NEGATIVE
LEUKOCYTES UA: NEGATIVE
NITRITE UA: NEGATIVE
RBC UA: NEGATIVE
SPEC GRAV UA: 1.028 (ref 1.005–1.030)
Urobilinogen, Ur: 1 mg/dL (ref 0.2–1.0)
pH, UA: 7 (ref 5.0–7.5)

## 2014-12-06 LAB — SICKLE CELL SCREEN: Sickle Cell Screen: NEGATIVE

## 2014-12-06 LAB — RPR: RPR: NONREACTIVE

## 2014-12-06 LAB — VARICELLA ZOSTER ANTIBODY, IGG: VARICELLA: 1612 {index} (ref >165)

## 2014-12-06 LAB — HEPATITIS B SURFACE ANTIGEN: Hepatitis B Surface Ag: NEGATIVE

## 2014-12-06 LAB — CYSTIC FIBROSIS MUTATION 97: Interpretation: NOT DETECTED

## 2014-12-06 LAB — HIV ANTIBODY (ROUTINE TESTING W REFLEX): HIV Screen 4th Generation wRfx: NONREACTIVE

## 2014-12-26 ENCOUNTER — Ambulatory Visit (INDEPENDENT_AMBULATORY_CARE_PROVIDER_SITE_OTHER): Payer: Medicaid Other

## 2014-12-26 ENCOUNTER — Ambulatory Visit (INDEPENDENT_AMBULATORY_CARE_PROVIDER_SITE_OTHER): Payer: Medicaid Other | Admitting: Advanced Practice Midwife

## 2014-12-26 VITALS — BP 102/62 | HR 74 | Wt 165.0 lb

## 2014-12-26 DIAGNOSIS — Z36 Encounter for antenatal screening of mother: Secondary | ICD-10-CM

## 2014-12-26 DIAGNOSIS — Z369 Encounter for antenatal screening, unspecified: Secondary | ICD-10-CM

## 2014-12-26 DIAGNOSIS — Z331 Pregnant state, incidental: Secondary | ICD-10-CM

## 2014-12-26 DIAGNOSIS — Z3682 Encounter for antenatal screening for nuchal translucency: Secondary | ICD-10-CM

## 2014-12-26 DIAGNOSIS — Z1389 Encounter for screening for other disorder: Secondary | ICD-10-CM

## 2014-12-26 DIAGNOSIS — Z3492 Encounter for supervision of normal pregnancy, unspecified, second trimester: Secondary | ICD-10-CM

## 2014-12-26 DIAGNOSIS — O283 Abnormal ultrasonic finding on antenatal screening of mother: Secondary | ICD-10-CM

## 2014-12-26 LAB — POCT URINALYSIS DIPSTICK
Glucose, UA: NEGATIVE
Ketones, UA: NEGATIVE
Leukocytes, UA: NEGATIVE
NITRITE UA: NEGATIVE
PROTEIN UA: NEGATIVE
RBC UA: NEGATIVE

## 2014-12-26 NOTE — Progress Notes (Signed)
NT/NB Ultrasound completed today.  Single, active fetus with FHR of 168 bpm.  CRL measures 74.45mm which is consistent with dating. Nasal bone is present.  NT appears thickened at 3.67mm.  Bilateral ovaries appear normal.  Small fibroid noted in right uterus measuring 9 x 9 x 6 mm.  Manus Gunning to see patient.

## 2014-12-26 NOTE — Progress Notes (Signed)
[id number] [redacted]w[redacted]d Estimated Date of Delivery: 07/02/15  Blood pressure 102/62, pulse 74, weight 165 lb (74.844 kg), last menstrual period 09/19/2014.   BP weight and urine results all reviewed and noted.  Please refer to the obstetrical flow sheet for the fundal height and fetal heart rate documentation:NT/NB Ultrasound completed today. Single, active fetus with FHR of 168 bpm. CRL measures 74.18mm which is consistent with dating. Nasal bone is present. NT appears thickened at 3.78mm. Bilateral ovaries appear normal. Small fibroid noted in right uterus measuring 9 x 9 x 6 mm  Patient denies any bleeding and no rupture of membranes symptoms or regular contractions. Patient is without complaints. All questions were answered.  Plan:  Continued routine obstetrical care, CFDNA testing today  Follow up in 4 weeks for OB appointment,

## 2015-01-01 ENCOUNTER — Telehealth: Payer: Self-pay | Admitting: *Deleted

## 2015-01-01 NOTE — Telephone Encounter (Signed)
Pt informed the InformaSeq results still pending from 12/26/2014.

## 2015-01-02 ENCOUNTER — Encounter: Payer: Self-pay | Admitting: Advanced Practice Midwife

## 2015-01-02 LAB — INFORMASEQ(SM) WITH XY ANALYSIS
Fetal Fraction (%):: 10.4
Fetal Number: 1
Gestational Age at Collection: 13.1 weeks
WEIGHT: 165 [lb_av]

## 2015-01-02 NOTE — Addendum Note (Signed)
Addended by: Christin Fudge on: 01/02/2015 09:19 PM   Modules accepted: Orders

## 2015-01-04 ENCOUNTER — Inpatient Hospital Stay (HOSPITAL_COMMUNITY)
Admission: EM | Admit: 2015-01-04 | Discharge: 2015-01-05 | Disposition: A | Discharge status: Home / Self Care | Payer: Medicaid Other | Attending: Obstetrics & Gynecology | Admitting: Obstetrics & Gynecology

## 2015-01-04 ENCOUNTER — Encounter (HOSPITAL_COMMUNITY): Payer: Self-pay | Admitting: Emergency Medicine

## 2015-01-04 DIAGNOSIS — O283 Abnormal ultrasonic finding on antenatal screening of mother: Secondary | ICD-10-CM

## 2015-01-04 DIAGNOSIS — Z3A14 14 weeks gestation of pregnancy: Secondary | ICD-10-CM | POA: Diagnosis not present

## 2015-01-04 DIAGNOSIS — O209 Hemorrhage in early pregnancy, unspecified: Secondary | ICD-10-CM

## 2015-01-04 DIAGNOSIS — Z3491 Encounter for supervision of normal pregnancy, unspecified, first trimester: Secondary | ICD-10-CM

## 2015-01-04 DIAGNOSIS — O26852 Spotting complicating pregnancy, second trimester: Secondary | ICD-10-CM | POA: Diagnosis not present

## 2015-01-04 DIAGNOSIS — Z3490 Encounter for supervision of normal pregnancy, unspecified, unspecified trimester: Secondary | ICD-10-CM

## 2015-01-04 LAB — CBC WITH DIFFERENTIAL/PLATELET
Basophils Absolute: 0 10*3/uL (ref 0.0–0.1)
Basophils Relative: 0 % (ref 0–1)
EOS ABS: 0.2 10*3/uL (ref 0.0–0.7)
Eosinophils Relative: 2 % (ref 0–5)
HEMATOCRIT: 35.1 % — AB (ref 36.0–46.0)
HEMOGLOBIN: 12.1 g/dL (ref 12.0–15.0)
LYMPHS ABS: 2.1 10*3/uL (ref 0.7–4.0)
LYMPHS PCT: 21 % (ref 12–46)
MCH: 31.4 pg (ref 26.0–34.0)
MCHC: 34.5 g/dL (ref 30.0–36.0)
MCV: 91.2 fL (ref 78.0–100.0)
MONO ABS: 0.8 10*3/uL (ref 0.1–1.0)
Monocytes Relative: 8 % (ref 3–12)
Neutro Abs: 7.1 10*3/uL (ref 1.7–7.7)
Neutrophils Relative %: 69 % (ref 43–77)
Platelets: 212 10*3/uL (ref 150–400)
RBC: 3.85 MIL/uL — AB (ref 3.87–5.11)
RDW: 12.2 % (ref 11.5–15.5)
WBC: 10.2 10*3/uL (ref 4.0–10.5)

## 2015-01-04 LAB — URINALYSIS, ROUTINE W REFLEX MICROSCOPIC
Bilirubin Urine: NEGATIVE
Glucose, UA: NEGATIVE mg/dL
HGB URINE DIPSTICK: NEGATIVE
Ketones, ur: NEGATIVE mg/dL
Leukocytes, UA: NEGATIVE
NITRITE: NEGATIVE
Protein, ur: NEGATIVE mg/dL
Specific Gravity, Urine: 1.025 (ref 1.005–1.030)
Urobilinogen, UA: 1 mg/dL (ref 0.0–1.0)
pH: 6 (ref 5.0–8.0)

## 2015-01-04 LAB — BASIC METABOLIC PANEL
ANION GAP: 6 (ref 5–15)
BUN: 12 mg/dL (ref 6–20)
CALCIUM: 8.4 mg/dL — AB (ref 8.9–10.3)
CO2: 23 mmol/L (ref 22–32)
Chloride: 107 mmol/L (ref 101–111)
Creatinine, Ser: 0.61 mg/dL (ref 0.44–1.00)
Glucose, Bld: 88 mg/dL (ref 65–99)
POTASSIUM: 3.4 mmol/L — AB (ref 3.5–5.1)
SODIUM: 136 mmol/L (ref 135–145)

## 2015-01-04 MED ORDER — SODIUM CHLORIDE 0.9 % IV BOLUS (SEPSIS)
1000.0000 mL | Freq: Once | INTRAVENOUS | Status: AC
  Administered 2015-01-04: 1000 mL via INTRAVENOUS

## 2015-01-04 NOTE — ED Notes (Signed)
Patient reports is approximately [redacted] weeks pregnant. States history of early deliveries and problems with pregnancy. States she went to the bathroom and noticed small amount of dark blood when she wiped.

## 2015-01-04 NOTE — ED Notes (Signed)
Pelvic exam and I&O cath being completed by Dr. Lacinda Axon; assisted by Elon Spanner, RN.

## 2015-01-04 NOTE — ED Provider Notes (Signed)
CSN: [id number]     Arrival date & time 01/04/15  2133 History  This chart was scribed for Nat Christen, MD by Jeanell Sparrow, ED Scribe. This patient was seen in room APA01/APA01 and the patient's care was started at 9:58 PM.  Chief Complaint  Patient presents with  . Vaginal Bleeding   The history is provided by the patient. No language interpreter was used.   HPI Comments: Linda Reid is a 29 y.o. female who is G6P3AB2 LMP 09/19/14 presents to the Emergency Department complaining of constant moderate vaginal bleeding that started today. She states that she is currently [redacted] weeks pregnant. She report that she is unsure whether the source of bleeding is cervical or urethral. She denies any abdominal pain, dysuria, flank pain, fever, chills.   OB/GYN- [facility]  History reviewed. No pertinent past medical history. Past Surgical History  Procedure Laterality Date  . Tonsillectomy     Family History  Problem Relation Age of Onset  . Diabetes Maternal Aunt   . Hypertension Maternal Aunt   . Stroke Maternal Aunt   . Diabetes Maternal Uncle   . Hypertension Maternal Uncle   . Hypertension Paternal Aunt   . Hypertension Paternal Uncle   . Cancer Maternal Grandmother     breast  . Diabetes Maternal Grandmother   . Hypertension Maternal Grandmother   . Heart disease Maternal Grandfather   . Hypertension Maternal Grandfather   . Hypertension Paternal Grandmother   . Hypertension Paternal Grandfather    History  Substance Use Topics  . Smoking status: Never Smoker   . Smokeless tobacco: Never Used  . Alcohol Use: No   OB History    Gravida Para Term Preterm AB TAB SAB Ectopic Multiple Living   6 3 2 1 2  2   3      Review of Systems A complete 10 system review of systems was obtained and all systems are negative except as noted in the HPI and PMH.   Allergies  Review of patient's allergies indicates no known allergies.  Home Medications   Prior to Admission medications    Medication Sig Start Date End Date Taking? Authorizing Provider  azithromycin (ZITHROMAX) 500 MG tablet Take 2 tablets (1,000 mg total) by mouth once. Patient not taking: Reported on 12/26/2014 12/04/14   Roma Schanz, CNM  cetirizine (ZYRTEC) 10 MG tablet Take 10 mg by mouth daily.    Historical Provider, MD  Doxylamine-Pyridoxine (DICLEGIS) 10-10 MG TBEC 2 tabs q hs, if sx persist add 1 tab q am on day 3, if sx persist add 1 tab q afternoon on day 4 12/02/14   Roma Schanz, CNM  Prenatal Vit-Fe Fumarate-FA (MULTIVITAMIN-PRENATAL) 27-0.8 MG TABS tablet Take 1 tablet by mouth daily at 12 noon.    Historical Provider, MD   BP 125/75 mmHg  Pulse 92  Temp(Src) 98 F (36.7 C) (Oral)  Resp 20  Ht 5\' 9"  (1.753 m)  Wt 167 lb (75.751 kg)  BMI 24.65 kg/m2  SpO2 100%  LMP 09/19/2014 (Exact Date) Physical Exam  Constitutional: She is oriented to person, place, and time. She appears well-developed and well-nourished.  HENT:  Head: Normocephalic and atraumatic.  Eyes: Conjunctivae and EOM are normal. Pupils are equal, round, and reactive to light.  Neck: Normal range of motion. Neck supple.  Cardiovascular: Normal rate and regular rhythm.   Pulmonary/Chest: Effort normal and breath sounds normal.  Abdominal: Soft. Bowel sounds are normal. There is no tenderness.  Genitourinary:  Urine sample obtained from urethra which appears normal.  Sterile speculum exam reveals blood oozing from the cervical os  Musculoskeletal: Normal range of motion.  Neurological: She is alert and oriented to person, place, and time.  Skin: Skin is warm and dry.  Psychiatric: She has a normal mood and affect. Her behavior is normal.  Nursing note and vitals reviewed.   ED Course  Procedures (including critical care time) DIAGNOSTIC STUDIES: Oxygen Saturation is 100% on RA, normal by my interpretation.    COORDINATION OF CARE: 10:02 PM- Pt advised of plan for treatment which includes labs and pt  agrees.  Labs Review Labs Reviewed  URINALYSIS, ROUTINE W REFLEX MICROSCOPIC    Imaging Review No results found.   EKG Interpretation None      MDM   Final diagnoses:  Pregnancy  Vaginal bleeding before [redacted] weeks gestation   Disc with Dr. Gala Romney.  Will except transfer to [facility]. Patient is hemodynamically stable.  I personally performed the services described in this documentation, which was scribed in my presence. The recorded information has been reviewed and is accurate.     Nat Christen, MD 01/04/15 2239

## 2015-01-04 NOTE — MAU Note (Signed)
Pt reports she had some bleeding tonight when she went to the bathroom. Denies pain or cramping. Went [facility] and was sent to MAU for U/S.  Small amount of red blood noted on pad.

## 2015-01-05 ENCOUNTER — Inpatient Hospital Stay (HOSPITAL_COMMUNITY): Payer: Medicaid Other

## 2015-01-05 ENCOUNTER — Encounter (HOSPITAL_COMMUNITY): Payer: Self-pay | Admitting: *Deleted

## 2015-01-05 DIAGNOSIS — O26852 Spotting complicating pregnancy, second trimester: Secondary | ICD-10-CM | POA: Diagnosis not present

## 2015-01-05 LAB — TYPE AND SCREEN
ABO/RH(D): AB POS
Antibody Screen: NEGATIVE

## 2015-01-05 LAB — WET PREP, GENITAL
Clue Cells Wet Prep HPF POC: NONE SEEN
TRICH WET PREP: NONE SEEN
YEAST WET PREP: NONE SEEN

## 2015-01-05 NOTE — MAU Provider Note (Signed)
History     CSN: [id number]  Arrival date and time: 01/04/15 2133   None     Chief Complaint  Patient presents with  . Vaginal Bleeding   HPI 29 y.o. [id number] at [redacted]w[redacted]d w/ vaginal bleeding starting today, no pain or discharge. Noted bleeding earlier today w/ wiping, no recent intercourse. Pt was treated for + CT a few weeks ago. Pt went to [facility] ED, provider there noted "trickle" of blood from cervix on pelvic exam, transferred by EMS. H/O SAB x 2, PPROM at 25 weeks, then 2 term deliveries.   History reviewed. No pertinent past medical history.  Past Surgical History  Procedure Laterality Date  . Tonsillectomy      Family History  Problem Relation Age of Onset  . Diabetes Maternal Aunt   . Hypertension Maternal Aunt   . Stroke Maternal Aunt   . Diabetes Maternal Uncle   . Hypertension Maternal Uncle   . Hypertension Paternal Aunt   . Hypertension Paternal Uncle   . Cancer Maternal Grandmother     breast  . Diabetes Maternal Grandmother   . Hypertension Maternal Grandmother   . Heart disease Maternal Grandfather   . Hypertension Maternal Grandfather   . Hypertension Paternal Grandmother   . Hypertension Paternal Grandfather     History  Substance Use Topics  . Smoking status: Never Smoker   . Smokeless tobacco: Never Used  . Alcohol Use: No    Allergies: No Known Allergies  No prescriptions prior to admission    Review of Systems  Constitutional: Negative.   Respiratory: Negative.   Cardiovascular: Negative.   Gastrointestinal: Negative for nausea, vomiting, abdominal pain, diarrhea and constipation.  Genitourinary: Negative for dysuria, urgency, frequency, hematuria and flank pain.       + bleeding, negative cramping   Musculoskeletal: Negative.   Neurological: Negative.   Psychiatric/Behavioral: Negative.    Physical Exam   Blood pressure 119/64, pulse 77, temperature 98.2 F (36.8 C), temperature source Oral, resp. rate 18, height 5\' 9"   (1.753 m), weight 167 lb (75.751 kg), last menstrual period 09/19/2014, SpO2 100 %.  Physical Exam  Constitutional: She is oriented to person, place, and time. She appears well-developed and well-nourished. No distress.  HENT:  Head: Normocephalic and atraumatic.  Cardiovascular: Normal rate and normal heart sounds.   Respiratory: Effort normal. No respiratory distress.  GI: Soft. She exhibits no distension and no mass. There is no tenderness. There is no rebound and no guarding.  Genitourinary: There is no rash or lesion on the right labia. There is no rash or lesion on the left labia. Uterus is not deviated, not enlarged, not fixed and not tender. Cervix exhibits no motion tenderness, no discharge and no friability. Right adnexum displays no mass, no tenderness and no fullness. Left adnexum displays no mass, no tenderness and no fullness. There is bleeding (small) in the vagina. No erythema or tenderness in the vagina. No vaginal discharge found.  Cervix closed visually  Neurological: She is alert and oriented to person, place, and time.  Skin: Skin is warm and dry.  Psychiatric: She has a normal mood and affect.    MAU Course  Procedures Results for orders placed or performed during the hospital encounter of 01/04/15 (from the past 24 hour(s))  Urinalysis, Routine w reflex microscopic     Status: None   Collection Time: 01/04/15 10:22 PM  Result Value Ref Range   Color, Urine YELLOW YELLOW   APPearance CLEAR CLEAR  Specific Gravity, Urine 1.025 1.005 - 1.030   pH 6.0 5.0 - 8.0   Glucose, UA NEGATIVE NEGATIVE mg/dL   Hgb urine dipstick NEGATIVE NEGATIVE   Bilirubin Urine NEGATIVE NEGATIVE   Ketones, ur NEGATIVE NEGATIVE mg/dL   Protein, ur NEGATIVE NEGATIVE mg/dL   Urobilinogen, UA 1.0 0.0 - 1.0 mg/dL   Nitrite NEGATIVE NEGATIVE   Leukocytes, UA NEGATIVE NEGATIVE  CBC with Differential/Platelet     Status: Abnormal   Collection Time: 01/04/15 11:00 PM  Result Value Ref Range    WBC 10.2 4.0 - 10.5 K/uL   RBC 3.85 (L) 3.87 - 5.11 MIL/uL   Hemoglobin 12.1 12.0 - 15.0 g/dL   HCT 35.1 (L) 36.0 - 46.0 %   MCV 91.2 78.0 - 100.0 fL   MCH 31.4 26.0 - 34.0 pg   MCHC 34.5 30.0 - 36.0 g/dL   RDW 12.2 11.5 - 15.5 %   Platelets 212 150 - 400 K/uL   Neutrophils Relative % 69 43 - 77 %   Neutro Abs 7.1 1.7 - 7.7 K/uL   Lymphocytes Relative 21 12 - 46 %   Lymphs Abs 2.1 0.7 - 4.0 K/uL   Monocytes Relative 8 3 - 12 %   Monocytes Absolute 0.8 0.1 - 1.0 K/uL   Eosinophils Relative 2 0 - 5 %   Eosinophils Absolute 0.2 0.0 - 0.7 K/uL   Basophils Relative 0 0 - 1 %   Basophils Absolute 0.0 0.0 - 0.1 K/uL  Basic metabolic panel     Status: Abnormal   Collection Time: 01/04/15 11:00 PM  Result Value Ref Range   Sodium 136 135 - 145 mmol/L   Potassium 3.4 (L) 3.5 - 5.1 mmol/L   Chloride 107 101 - 111 mmol/L   CO2 23 22 - 32 mmol/L   Glucose, Bld 88 65 - 99 mg/dL   BUN 12 6 - 20 mg/dL   Creatinine, Ser 0.61 0.44 - 1.00 mg/dL   Calcium 8.4 (L) 8.9 - 10.3 mg/dL   GFR calc non Af Amer >60 >60 mL/min   GFR calc Af Amer >60 >60 mL/min   Anion gap 6 5 - 15  Type and screen for Red Blood Exchange     Status: None   Collection Time: 01/04/15 11:00 PM  Result Value Ref Range   ABO/RH(D) AB POS    Antibody Screen NEG    Sample Expiration 01/07/2015   Wet prep, genital     Status: Abnormal   Collection Time: 01/05/15 12:25 AM  Result Value Ref Range   Yeast Wet Prep HPF POC NONE SEEN NONE SEEN   Trich, Wet Prep NONE SEEN NONE SEEN   Clue Cells Wet Prep HPF POC NONE SEEN NONE SEEN   WBC, Wet Prep HPF POC FEW (A) NONE SEEN   Limited U/S: normal placenta, 3 cm from internal os, subjectively normal cervical length, + FHR, no cause for bleeding noted   Assessment and Plan   29 y.o. [id number] at [redacted]w[redacted]d w/ vaginal bleeding No source of bleeding noted today, exam reassuring D/C home, pelvic rest, monitor for increased bleeding or pain, f/u in office as scheduled or sooner PRN     Medication List    STOP taking these medications        azithromycin 500 MG tablet  Commonly known as:  ZITHROMAX      TAKE these medications        cetirizine 10 MG tablet  Commonly known as:  ZYRTEC  Take 10 mg by mouth daily as needed for allergies.     Doxylamine-Pyridoxine 10-10 MG Tbec  Commonly known as:  DICLEGIS  2 tabs q hs, if sx persist add 1 tab q am on day 3, if sx persist add 1 tab q afternoon on day 4     multivitamin-prenatal 27-0.8 MG Tabs tablet  Take 1 tablet by mouth daily at 12 noon.            Follow-up Information    Follow up with [facility].   Why:  as scheduled or sooner as needed   Contact information:   [place] [id number] [employer] 01/05/2015, 1:40 AM

## 2015-01-06 ENCOUNTER — Telehealth: Payer: Self-pay | Admitting: *Deleted

## 2015-01-06 LAB — CERVICOVAGINAL ANCILLARY ONLY
Chlamydia: NEGATIVE
NEISSERIA GONORRHEA: NEGATIVE

## 2015-01-06 NOTE — Telephone Encounter (Signed)
Pt states someone from our office tried to call her this morning.  There are no notes in her chart and I asked around and no one that I can tell tried calling her, I informed her it could have been the genetic consoler calling to make her appointment but she states it was our # that called.  She also states she went to the ED @ [facility] on Saturday with VB and they transferred her to [facility] where she was checked out but they told her to make a follow up appointment with [name] ASAP.  Call was transferred to front staff for appointment to be made.

## 2015-01-08 ENCOUNTER — Ambulatory Visit (INDEPENDENT_AMBULATORY_CARE_PROVIDER_SITE_OTHER): Payer: Medicaid Other | Admitting: Women's Health

## 2015-01-08 ENCOUNTER — Encounter: Payer: Self-pay | Admitting: Women's Health

## 2015-01-08 VITALS — BP 124/60 | HR 88 | Wt 166.0 lb

## 2015-01-08 DIAGNOSIS — A749 Chlamydial infection, unspecified: Secondary | ICD-10-CM

## 2015-01-08 DIAGNOSIS — O4692 Antepartum hemorrhage, unspecified, second trimester: Secondary | ICD-10-CM

## 2015-01-08 DIAGNOSIS — O09892 Supervision of other high risk pregnancies, second trimester: Secondary | ICD-10-CM

## 2015-01-08 DIAGNOSIS — Z331 Pregnant state, incidental: Secondary | ICD-10-CM

## 2015-01-08 DIAGNOSIS — O283 Abnormal ultrasonic finding on antenatal screening of mother: Secondary | ICD-10-CM

## 2015-01-08 DIAGNOSIS — O98811 Other maternal infectious and parasitic diseases complicating pregnancy, first trimester: Secondary | ICD-10-CM

## 2015-01-08 DIAGNOSIS — Z3492 Encounter for supervision of normal pregnancy, unspecified, second trimester: Secondary | ICD-10-CM

## 2015-01-08 DIAGNOSIS — O09212 Supervision of pregnancy with history of pre-term labor, second trimester: Secondary | ICD-10-CM

## 2015-01-08 DIAGNOSIS — Z1389 Encounter for screening for other disorder: Secondary | ICD-10-CM

## 2015-01-08 LAB — POCT URINALYSIS DIPSTICK
Blood, UA: NEGATIVE
GLUCOSE UA: NEGATIVE
Ketones, UA: NEGATIVE
LEUKOCYTES UA: NEGATIVE
NITRITE UA: NEGATIVE
Protein, UA: NEGATIVE

## 2015-01-08 NOTE — Progress Notes (Signed)
Low-risk OB appointment [id number] [redacted]w[redacted]d Estimated Date of Delivery: 07/02/15 BP 124/60 mmHg  Pulse 88  Wt 166 lb (75.297 kg)  LMP 09/19/2014 (Exact Date)  BP, weight, and urine reviewed.  Refer to obstetrical flow sheet for FH & FHR.  No fm yet. Denies cramping, lof, or uti s/s. Here for f/u visit to [facility]-transfer to [facility] on 5/15 for VB- had normal u/s at [facility]- no previa or evidence of sch, ct poc neg then. Bleeding began to slow yesterday and is now just light spotting. Rh+ Had thickened NT w/ normal CFDNA- had been offered genetic counseling- hasn't heard from them, now declines counseling Ordered makena on 4/11 visit- still not here, so reordered today- needs to begin at appt next week Reviewed warning s/s to report. Pelvic rest until at least 7d from any bleeding, increase fluids, rest Plan:  Continue routine obstetrical care  F/U in 1wk as scheduled for OB appointment & to begin [facility]

## 2015-01-08 NOTE — Patient Instructions (Signed)
No sex until it has been at least 7 days from the last time you have seen any bleeding If you start having bad cramps or bleeding becomes very heavy, please seek care Increase fluids and rest for now  Vaginal Bleeding During Pregnancy, Second Trimester A small amount of bleeding (spotting) from the vagina is relatively common in pregnancy. It usually stops on its own. Various things can cause bleeding or spotting in pregnancy. Some bleeding may be related to the pregnancy, and some may not. Sometimes the bleeding is normal and is not a problem. However, bleeding can also be a sign of something serious. Be sure to tell your health care provider about any vaginal bleeding right away. Some possible causes of vaginal bleeding during the second trimester include:  Infection, inflammation, or growths on the cervix.   The placenta may be partially or completely covering the opening of the cervix inside the uterus (placenta previa).  The placenta may have separated from the uterus (abruption of the placenta).   You may be having early (preterm) labor.   The cervix may not be strong enough to keep a baby inside the uterus (cervical insufficiency).   Tiny cysts may have developed in the uterus instead of pregnancy tissue (molar pregnancy). HOME CARE INSTRUCTIONS  Watch your condition for any changes. The following actions may help to lessen any discomfort you are feeling:  Follow your health care provider's instructions for limiting your activity. If your health care provider orders bed rest, you may need to stay in bed and only get up to use the bathroom. However, your health care provider may allow you to continue light activity.  If needed, make plans for someone to help with your regular activities and responsibilities while you are on bed rest.  Keep track of the number of pads you use each day, how often you change pads, and how soaked (saturated) they are. Write this down.  Do not use  tampons. Do not douche.  Do not have sexual intercourse or orgasms until approved by your health care provider.  If you pass any tissue from your vagina, save the tissue so you can show it to your health care provider.  Only take over-the-counter or prescription medicines as directed by your health care provider.  Do not take aspirin because it can make you bleed.  Do not exercise or perform any strenuous activities or heavy lifting without your health care provider's permission.  Keep all follow-up appointments as directed by your health care provider. SEEK MEDICAL CARE IF:  You have any vaginal bleeding during any part of your pregnancy.  You have cramps or labor pains.  You have a fever, not controlled by medicine. SEEK IMMEDIATE MEDICAL CARE IF:   You have severe cramps in your back or belly (abdomen).  You have contractions.  You have chills.  You pass large clots or tissue from your vagina.  Your bleeding increases.  You feel light-headed or weak, or you have fainting episodes.  You are leaking fluid or have a gush of fluid from your vagina. MAKE SURE YOU:  Understand these instructions.  Will watch your condition.  Will get help right away if you are not doing well or get worse. Document Released: 05/19/2005 Document Revised: 08/14/2013 Document Reviewed: 04/16/2013 [employer] Patient Information 2015 [place], [place]. This information is not intended to replace advice given to you by your health care provider. Make sure you discuss any questions you have with your health care provider.

## 2015-01-13 ENCOUNTER — Telehealth: Payer: Self-pay | Admitting: *Deleted

## 2015-01-13 NOTE — Telephone Encounter (Signed)
Pt states having light vaginal bleeding and lower back pain. Pt states noticed bleeding when she wiped this am and then this afternoon noticed a small amount of light blood in undergarment, no sex or straining with BM. Pt states saw Knute Neu, CNM on 01/08/2015 and was evaluated at that time for vaginal bleeding.Per Dr. Elonda Husky, pt to continue to monitor bleeding if increases to notify our office. Pt also informed can take tylenol and push fluids for back pain. Pt verbalized understanding.

## 2015-01-17 ENCOUNTER — Encounter: Payer: Self-pay | Admitting: Obstetrics & Gynecology

## 2015-01-17 ENCOUNTER — Ambulatory Visit (INDEPENDENT_AMBULATORY_CARE_PROVIDER_SITE_OTHER): Payer: Medicaid Other | Admitting: Obstetrics & Gynecology

## 2015-01-17 VITALS — BP 110/60 | HR 84 | Wt 167.0 lb

## 2015-01-17 DIAGNOSIS — Z8751 Personal history of pre-term labor: Secondary | ICD-10-CM

## 2015-01-17 DIAGNOSIS — O283 Abnormal ultrasonic finding on antenatal screening of mother: Secondary | ICD-10-CM

## 2015-01-17 DIAGNOSIS — Z3492 Encounter for supervision of normal pregnancy, unspecified, second trimester: Secondary | ICD-10-CM

## 2015-01-17 DIAGNOSIS — O288 Other abnormal findings on antenatal screening of mother: Secondary | ICD-10-CM

## 2015-01-17 DIAGNOSIS — Z1389 Encounter for screening for other disorder: Secondary | ICD-10-CM

## 2015-01-17 DIAGNOSIS — Z331 Pregnant state, incidental: Secondary | ICD-10-CM

## 2015-01-17 LAB — POCT URINALYSIS DIPSTICK
Blood, UA: 3
Glucose, UA: NEGATIVE
KETONES UA: NEGATIVE
Leukocytes, UA: NEGATIVE
NITRITE UA: NEGATIVE
Protein, UA: NEGATIVE

## 2015-01-17 MED ORDER — HYDROXYPROGESTERONE CAPROATE 250 MG/ML IM OIL
250.0000 mg | TOPICAL_OIL | Freq: Once | INTRAMUSCULAR | Status: AC
  Administered 2015-01-17: 250 mg via INTRAMUSCULAR

## 2015-01-17 NOTE — Progress Notes (Signed)
[id number] [redacted]w[redacted]d Estimated Date of Delivery: 07/02/15  Blood pressure 110/60, pulse 84, weight 167 lb (75.751 kg), last menstrual period 09/19/2014.   BP weight and urine results all reviewed and noted.  Please refer to the obstetrical flow sheet for the fundal height and fetal heart rate documentation:  Patient reports good fetal movement, denies any bleeding and no rupture of membranes symptoms or regular contractions. Patient is without complaints. All questions were answered.  Plan:  Continued routine obstetrical care,   Follow up in 4 weeks for OB appointment, sonogtam

## 2015-01-17 NOTE — Addendum Note (Signed)
Addended by: Doyne Keel on: 01/17/2015 11:21 AM   Modules accepted: Orders, Level of Service

## 2015-01-24 ENCOUNTER — Encounter: Payer: Self-pay | Admitting: *Deleted

## 2015-01-24 ENCOUNTER — Ambulatory Visit (INDEPENDENT_AMBULATORY_CARE_PROVIDER_SITE_OTHER): Payer: Medicaid Other | Admitting: *Deleted

## 2015-01-24 VITALS — BP 108/58 | HR 76 | Ht 69.0 in | Wt 166.5 lb

## 2015-01-24 DIAGNOSIS — O09212 Supervision of pregnancy with history of pre-term labor, second trimester: Secondary | ICD-10-CM

## 2015-01-24 DIAGNOSIS — Z3492 Encounter for supervision of normal pregnancy, unspecified, second trimester: Secondary | ICD-10-CM

## 2015-01-24 DIAGNOSIS — O09892 Supervision of other high risk pregnancies, second trimester: Secondary | ICD-10-CM

## 2015-01-24 DIAGNOSIS — Z1389 Encounter for screening for other disorder: Secondary | ICD-10-CM

## 2015-01-24 DIAGNOSIS — O283 Abnormal ultrasonic finding on antenatal screening of mother: Secondary | ICD-10-CM

## 2015-01-24 DIAGNOSIS — Z331 Pregnant state, incidental: Secondary | ICD-10-CM

## 2015-01-24 LAB — POCT URINALYSIS DIPSTICK
Glucose, UA: NEGATIVE
Leukocytes, UA: NEGATIVE
NITRITE UA: NEGATIVE

## 2015-01-24 MED ORDER — HYDROXYPROGESTERONE CAPROATE 250 MG/ML IM OIL
250.0000 mg | TOPICAL_OIL | Freq: Once | INTRAMUSCULAR | Status: AC
  Administered 2015-01-24: 250 mg via INTRAMUSCULAR

## 2015-01-24 NOTE — Progress Notes (Signed)
Pt here for 17P. Reports no problems at this time. Return in 1 week for next shot. [place]

## 2015-01-31 ENCOUNTER — Ambulatory Visit: Payer: Medicaid Other

## 2015-01-31 ENCOUNTER — Ambulatory Visit (INDEPENDENT_AMBULATORY_CARE_PROVIDER_SITE_OTHER): Payer: Medicaid Other | Admitting: *Deleted

## 2015-01-31 ENCOUNTER — Encounter: Payer: Self-pay | Admitting: *Deleted

## 2015-01-31 VITALS — BP 110/70 | HR 84 | Ht 69.0 in | Wt 166.0 lb

## 2015-01-31 DIAGNOSIS — Z3492 Encounter for supervision of normal pregnancy, unspecified, second trimester: Secondary | ICD-10-CM

## 2015-01-31 DIAGNOSIS — O09892 Supervision of other high risk pregnancies, second trimester: Secondary | ICD-10-CM

## 2015-01-31 DIAGNOSIS — Z331 Pregnant state, incidental: Secondary | ICD-10-CM

## 2015-01-31 DIAGNOSIS — O283 Abnormal ultrasonic finding on antenatal screening of mother: Secondary | ICD-10-CM

## 2015-01-31 DIAGNOSIS — Z1389 Encounter for screening for other disorder: Secondary | ICD-10-CM

## 2015-01-31 DIAGNOSIS — O09212 Supervision of pregnancy with history of pre-term labor, second trimester: Secondary | ICD-10-CM

## 2015-01-31 LAB — POCT URINALYSIS DIPSTICK
Glucose, UA: NEGATIVE
Ketones, UA: NEGATIVE
Leukocytes, UA: NEGATIVE
NITRITE UA: NEGATIVE
Protein, UA: NEGATIVE

## 2015-01-31 MED ORDER — HYDROXYPROGESTERONE CAPROATE 250 MG/ML IM OIL
250.0000 mg | TOPICAL_OIL | Freq: Once | INTRAMUSCULAR | Status: AC
  Administered 2015-01-31: 250 mg via INTRAMUSCULAR

## 2015-01-31 NOTE — Progress Notes (Signed)
Pt here for 17P. Reports no problems at this time. Return in 1 week for next shot. [place]

## 2015-02-07 ENCOUNTER — Encounter: Payer: Self-pay | Admitting: *Deleted

## 2015-02-07 ENCOUNTER — Ambulatory Visit (INDEPENDENT_AMBULATORY_CARE_PROVIDER_SITE_OTHER): Payer: Medicaid Other | Admitting: *Deleted

## 2015-02-07 VITALS — BP 108/64 | HR 92 | Ht 69.0 in | Wt 167.0 lb

## 2015-02-07 DIAGNOSIS — Z331 Pregnant state, incidental: Secondary | ICD-10-CM

## 2015-02-07 DIAGNOSIS — O09892 Supervision of other high risk pregnancies, second trimester: Secondary | ICD-10-CM

## 2015-02-07 DIAGNOSIS — O283 Abnormal ultrasonic finding on antenatal screening of mother: Secondary | ICD-10-CM

## 2015-02-07 DIAGNOSIS — Z1389 Encounter for screening for other disorder: Secondary | ICD-10-CM

## 2015-02-07 DIAGNOSIS — O09212 Supervision of pregnancy with history of pre-term labor, second trimester: Secondary | ICD-10-CM | POA: Diagnosis not present

## 2015-02-07 DIAGNOSIS — Z3492 Encounter for supervision of normal pregnancy, unspecified, second trimester: Secondary | ICD-10-CM

## 2015-02-07 LAB — POCT URINALYSIS DIPSTICK
Glucose, UA: NEGATIVE
KETONES UA: NEGATIVE
Nitrite, UA: NEGATIVE
PROTEIN UA: NEGATIVE

## 2015-02-07 MED ORDER — HYDROXYPROGESTERONE CAPROATE 250 MG/ML IM OIL
250.0000 mg | TOPICAL_OIL | Freq: Once | INTRAMUSCULAR | Status: AC
  Administered 2015-02-07: 250 mg via INTRAMUSCULAR

## 2015-02-07 NOTE — Progress Notes (Signed)
Pt here for 17P. Reports no problems at this time. Return in 1 week for next shot. [place]

## 2015-02-13 ENCOUNTER — Other Ambulatory Visit: Payer: Self-pay | Admitting: Obstetrics & Gynecology

## 2015-02-13 DIAGNOSIS — Z1389 Encounter for screening for other disorder: Secondary | ICD-10-CM

## 2015-02-13 DIAGNOSIS — O283 Abnormal ultrasonic finding on antenatal screening of mother: Secondary | ICD-10-CM

## 2015-02-14 ENCOUNTER — Ambulatory Visit (INDEPENDENT_AMBULATORY_CARE_PROVIDER_SITE_OTHER): Payer: Medicaid Other

## 2015-02-14 ENCOUNTER — Encounter: Payer: Self-pay | Admitting: Obstetrics & Gynecology

## 2015-02-14 ENCOUNTER — Ambulatory Visit (INDEPENDENT_AMBULATORY_CARE_PROVIDER_SITE_OTHER): Payer: Medicaid Other | Admitting: Obstetrics & Gynecology

## 2015-02-14 VITALS — BP 100/60 | HR 72 | Wt 168.0 lb

## 2015-02-14 DIAGNOSIS — O288 Other abnormal findings on antenatal screening of mother: Secondary | ICD-10-CM

## 2015-02-14 DIAGNOSIS — Z3492 Encounter for supervision of normal pregnancy, unspecified, second trimester: Secondary | ICD-10-CM

## 2015-02-14 DIAGNOSIS — O09212 Supervision of pregnancy with history of pre-term labor, second trimester: Secondary | ICD-10-CM | POA: Diagnosis not present

## 2015-02-14 DIAGNOSIS — Z36 Encounter for antenatal screening of mother: Secondary | ICD-10-CM | POA: Diagnosis not present

## 2015-02-14 DIAGNOSIS — Z331 Pregnant state, incidental: Secondary | ICD-10-CM

## 2015-02-14 DIAGNOSIS — Z1389 Encounter for screening for other disorder: Secondary | ICD-10-CM

## 2015-02-14 DIAGNOSIS — Z8751 Personal history of pre-term labor: Secondary | ICD-10-CM

## 2015-02-14 DIAGNOSIS — O283 Abnormal ultrasonic finding on antenatal screening of mother: Secondary | ICD-10-CM

## 2015-02-14 LAB — POCT URINALYSIS DIPSTICK
GLUCOSE UA: NEGATIVE
Ketones, UA: NEGATIVE
Leukocytes, UA: NEGATIVE
Nitrite, UA: NEGATIVE
Protein, UA: NEGATIVE
RBC UA: NEGATIVE

## 2015-02-14 MED ORDER — HYDROXYPROGESTERONE CAPROATE 250 MG/ML IM OIL
250.0000 mg | TOPICAL_OIL | Freq: Once | INTRAMUSCULAR | Status: AC
  Administered 2015-02-14: 250 mg via INTRAMUSCULAR

## 2015-02-14 NOTE — Addendum Note (Signed)
Addended by: Doyne Keel on: 02/14/2015 09:51 AM   Modules accepted: Orders

## 2015-02-14 NOTE — Progress Notes (Signed)
US,20+2wks,measurements c/w dates,fundal pl gr 0,normal ov's bilat,afi sdp 4.2cm,cx 3.5cm,fht 153 bpm,anatomy complete,no obvious abn seen

## 2015-02-14 NOTE — Progress Notes (Signed)
[id number] [redacted]w[redacted]d Estimated Date of Delivery: 07/02/15  Blood pressure 100/60, pulse 72, weight 168 lb (76.204 kg), last menstrual period 09/19/2014.   BP weight and urine results all reviewed and noted.  Please refer to the obstetrical flow sheet for the fundal height and fetal heart rate documentation:  Patient reports good fetal movement, denies any bleeding and no rupture of membranes symptoms or regular contractions. Patient is without complaints. All questions were answered.  Plan:  Continued routine obstetrical care,   Follow up in 4 weeks for OB appointment,   Weekly 17P injections

## 2015-02-21 ENCOUNTER — Encounter: Payer: Self-pay | Admitting: *Deleted

## 2015-02-21 ENCOUNTER — Ambulatory Visit (INDEPENDENT_AMBULATORY_CARE_PROVIDER_SITE_OTHER): Payer: Medicaid Other | Admitting: *Deleted

## 2015-02-21 VITALS — BP 100/60 | HR 80 | Ht 69.0 in | Wt 170.0 lb

## 2015-02-21 DIAGNOSIS — Z3492 Encounter for supervision of normal pregnancy, unspecified, second trimester: Secondary | ICD-10-CM

## 2015-02-21 DIAGNOSIS — O283 Abnormal ultrasonic finding on antenatal screening of mother: Secondary | ICD-10-CM

## 2015-02-21 DIAGNOSIS — Z331 Pregnant state, incidental: Secondary | ICD-10-CM

## 2015-02-21 DIAGNOSIS — Z1389 Encounter for screening for other disorder: Secondary | ICD-10-CM

## 2015-02-21 DIAGNOSIS — O09212 Supervision of pregnancy with history of pre-term labor, second trimester: Secondary | ICD-10-CM | POA: Diagnosis not present

## 2015-02-21 DIAGNOSIS — O09892 Supervision of other high risk pregnancies, second trimester: Secondary | ICD-10-CM

## 2015-02-21 DIAGNOSIS — R938 Abnormal findings on diagnostic imaging of other specified body structures: Secondary | ICD-10-CM

## 2015-02-21 LAB — POCT URINALYSIS DIPSTICK
Glucose, UA: NEGATIVE
KETONES UA: NEGATIVE
Leukocytes, UA: NEGATIVE
Nitrite, UA: NEGATIVE
Protein, UA: NEGATIVE

## 2015-02-21 MED ORDER — HYDROXYPROGESTERONE CAPROATE 250 MG/ML IM OIL
250.0000 mg | TOPICAL_OIL | Freq: Once | INTRAMUSCULAR | Status: AC
  Administered 2015-02-21: 250 mg via INTRAMUSCULAR

## 2015-02-21 NOTE — Progress Notes (Signed)
Pt here for 17P. Reports no problems at this time. Return in 1 week for next shot. [name]

## 2015-02-28 ENCOUNTER — Encounter: Payer: Self-pay | Admitting: *Deleted

## 2015-02-28 ENCOUNTER — Ambulatory Visit (INDEPENDENT_AMBULATORY_CARE_PROVIDER_SITE_OTHER): Payer: Medicaid Other | Admitting: *Deleted

## 2015-02-28 VITALS — BP 110/68 | HR 88 | Ht 69.0 in | Wt 172.0 lb

## 2015-02-28 DIAGNOSIS — Z3492 Encounter for supervision of normal pregnancy, unspecified, second trimester: Secondary | ICD-10-CM

## 2015-02-28 DIAGNOSIS — O09212 Supervision of pregnancy with history of pre-term labor, second trimester: Secondary | ICD-10-CM

## 2015-02-28 DIAGNOSIS — Z1389 Encounter for screening for other disorder: Secondary | ICD-10-CM

## 2015-02-28 DIAGNOSIS — O283 Abnormal ultrasonic finding on antenatal screening of mother: Secondary | ICD-10-CM

## 2015-02-28 DIAGNOSIS — Z331 Pregnant state, incidental: Secondary | ICD-10-CM

## 2015-02-28 DIAGNOSIS — O09892 Supervision of other high risk pregnancies, second trimester: Secondary | ICD-10-CM

## 2015-02-28 LAB — POCT URINALYSIS DIPSTICK
Glucose, UA: NEGATIVE
Ketones, UA: NEGATIVE
Nitrite, UA: NEGATIVE
Protein, UA: NEGATIVE

## 2015-02-28 MED ORDER — HYDROXYPROGESTERONE CAPROATE 250 MG/ML IM OIL
250.0000 mg | TOPICAL_OIL | Freq: Once | INTRAMUSCULAR | Status: AC
  Administered 2015-02-28: 250 mg via INTRAMUSCULAR

## 2015-02-28 NOTE — Progress Notes (Signed)
Pt here for 17P. Reports back pain last pm. Not as bad today. Pressure in back last night, not as much today. Advised to drink plenty of fluids and take it easy. Advised if things change, call us or call after hours nurse line. Pt voiced understanding. Return in 1 week for next shot. [place]

## 2015-03-04 ENCOUNTER — Ambulatory Visit (INDEPENDENT_AMBULATORY_CARE_PROVIDER_SITE_OTHER): Payer: Medicaid Other | Admitting: Advanced Practice Midwife

## 2015-03-04 ENCOUNTER — Encounter: Payer: Self-pay | Admitting: Advanced Practice Midwife

## 2015-03-04 VITALS — BP 110/70 | HR 84 | Wt 168.0 lb

## 2015-03-04 DIAGNOSIS — Z1389 Encounter for screening for other disorder: Secondary | ICD-10-CM

## 2015-03-04 DIAGNOSIS — N949 Unspecified condition associated with female genital organs and menstrual cycle: Secondary | ICD-10-CM

## 2015-03-04 DIAGNOSIS — Z331 Pregnant state, incidental: Secondary | ICD-10-CM

## 2015-03-04 DIAGNOSIS — O26892 Other specified pregnancy related conditions, second trimester: Secondary | ICD-10-CM

## 2015-03-04 DIAGNOSIS — Z3492 Encounter for supervision of normal pregnancy, unspecified, second trimester: Secondary | ICD-10-CM

## 2015-03-04 DIAGNOSIS — R102 Pelvic and perineal pain: Secondary | ICD-10-CM

## 2015-03-04 LAB — FETAL FIBRONECTIN: Fetal Fibronectin: NEGATIVE

## 2015-03-04 LAB — POCT URINALYSIS DIPSTICK
Blood, UA: NEGATIVE
Glucose, UA: NEGATIVE
Ketones, UA: NEGATIVE
LEUKOCYTES UA: NEGATIVE
Nitrite, UA: NEGATIVE
Protein, UA: NEGATIVE

## 2015-03-04 NOTE — Progress Notes (Signed)
Pt worked in today for pain and pressure. Started yesterday evening and stayed all night to now. Pt denies any bleeding, gush of fluid or urinary symptoms.

## 2015-03-04 NOTE — Patient Instructions (Addendum)
1. Before your test, do not eat or drink anything for 8-10 hours prior to your  appointment (a small amount of water is allowed and you may take any medicines you normally take). Be sure to drink lots of water the day before. 2. When you arrive, your blood will be drawn for a 'fasting' blood sugar level.  Then you will be given a sweetened carbonated beverage to drink. You should  complete drinking this beverage within five minutes. After finishing the  beverage, you will have your blood drawn exactly 1 and 2 hours later. Having  your blood drawn on time is an important part of this test. A total of three blood  samples will be done. 3. The test takes approximately 2  hours. During the test, do not have anything to  eat or drink. Do not smoke, chew gum (not even sugarless gum) or use breath mints.  4. During the test you should remain close by and seated as much as possible and  avoid walking around. You may want to bring a book or something else to  occupy your time.  5. After your test, you may eat and drink as normal. You may want to bring a snack  to eat after the test is finished. Your provider will advise you as to the results of  this test and any follow-up if necessary  You will also be retested for syphilis, HIV and blood levels (anemia):  You were already tested in the first trimester, but [place] recommends retesting.  Additionally, you will be tested for Type 2 Herpes. MOST people do not know that they have genital herpes, as only around 15% of people have outbreaks.  However, it is still transmittable to other people, including the baby (but only during the birth).  If you test positive for Type 2 Herpes, we place you on a medicine called acyclovir the last 6 weeks of your pregnancy to prevent transmission of the virus to the baby during the birth.    If your sugar test is positive for gestational diabetes, you will be given an phone call and further instructions discussed.   We typically do not call patients with positive herpes results, but will discuss it at your next appointment.  If you wish to know all of your test results before your next appointment, feel free to call the office, or look up your test results on Mychart.  (The range that the lab uses for normal values of the sugar test are not necessarily the range that is used for pregnant women; if your results are within the range, they are definitely normal.  However, if a value is deemed "high" by the lab, it may not be too high for a pregnant woman.  We will need to discuss the normal range if your value(s) fall in the "high" category).     Tdap Vaccine  It is recommended that you get the Tdap vaccine during the third trimester of EACH pregnancy to help protect your baby from getting pertussis (whooping cough)  27-36 weeks is the BEST time to do this so that you can pass the protection on to your baby. During pregnancy is better than after pregnancy, but if you are unable to get it during pregnancy it will be offered at the hospital.  You can get this vaccine at the health department or your family doctor, as well as some pharmacies.  Everyone who will be around your baby should also be up-to-date on  their vaccines. Adults (who are not pregnant) only need 1 dose of Tdap during adulthood.     Round Ligament Pain During Pregnancy Round ligament pain is a sharp pain or jabbing feeling often felt in the lower belly or groin area on one or both sides. It is one of the most common complaints during pregnancy and is considered a normal part of pregnancy. It is most often felt during the second trimester.  Here is what you need to know about round ligament pain, including some tips to help you feel better.  Causes of Round Ligament Pain  Several thick ligaments surround and support your womb (uterus) as it grows during pregnancy. One of them is called the round ligament.  The round ligament connects the front  part of the womb to your groin, the area where your legs attach to your pelvis. The round ligament normally tightens and relaxes slowly.  As your baby and womb grow, the round ligament stretches. That makes it more likely to become strained.  Sudden movements can cause the ligament to tighten quickly, like a rubber band snapping. This causes a sudden and quick jabbing feeling.  Symptoms of Round Ligament Pain  Round ligament pain can be concerning and uncomfortable. But it is considered normal as your body changes during pregnancy.  The symptoms of round ligament pain include a sharp, sudden spasm in the belly. It usually affects the right side, but it may happen on both sides. The pain only lasts a few seconds.  Exercise may cause the pain, as will rapid movements such as:  sneezing coughing laughing rolling over in bed standing up too quickly  Treatment of Round Ligament Pain  Here are some tips that may help reduce your discomfort:  Pain relief. Take over-the-counter acetaminophen for pain, if necessary. Ask your doctor if this is OK.  Exercise. Get plenty of exercise to keep your stomach (core) muscles strong. Doing stretching exercises or prenatal yoga can be helpful. Ask your doctor which exercises are safe for you and your baby.  A helpful exercise involves putting your hands and knees on the floor, lowering your head, and pushing your backside into the air.  Avoid sudden movements. Change positions slowly (such as standing up or sitting down) to avoid sudden movements that may cause stretching and pain.  Flex your hips. Bend and flex your hips before you cough, sneeze, or laugh to avoid pulling on the ligaments.  Apply warmth. A heating pad or warm bath may be helpful. Ask your doctor if this is OK. Extreme heat can be dangerous to the baby.  You should try to modify your daily activity level and avoid positions that may worsen the condition.  When to Call the  Doctor/Midwife  Always tell your doctor or midwife about any type of pain you have during pregnancy. Round ligament pain is quick and doesn't last long.  Call your health care provider immediately if you have:  severe pain fever chills pain on urination difficulty walking  Belly pain during pregnancy can be due to many different causes. It is important for your doctor to rule out more serious conditions, including pregnancy complications such as placenta abruption or non-pregnancy illnesses such as:  inguinal hernia appendicitis stomach, liver, and kidney problems Preterm labor pains may sometimes be mistaken for round ligament pain.

## 2015-03-04 NOTE — Progress Notes (Signed)
[id number] [redacted]w[redacted]d Estimated Date of Delivery: 07/02/15   WORK-IN FOR PELVIC PAIN/PRESSURE when standing and moving.    Blood pressure 110/70, pulse 84, weight 168 lb (76.204 kg), last menstrual period 09/19/2014.   BP weight and urine results all reviewed and noted.  Please refer to the obstetrical flow sheet for the fundal height and fetal heart rate documentation:  Patient reports good fetal movement, denies any bleeding, no ROM sx CX:  LTC, pp out of pelvis.    fFN collected and sent All questions were answered. Sounds like musculoskeletal/round ligament pain.  Info on Kinesiology taping/maternity belt given  Plan:  Continued routine obstetrical care, continue weekly 17p  Follow up as scheduled for OB appointment, 17p

## 2015-03-07 ENCOUNTER — Ambulatory Visit (INDEPENDENT_AMBULATORY_CARE_PROVIDER_SITE_OTHER): Payer: Medicaid Other | Admitting: *Deleted

## 2015-03-07 ENCOUNTER — Encounter: Payer: Self-pay | Admitting: *Deleted

## 2015-03-07 VITALS — BP 100/64 | HR 72 | Ht 69.0 in

## 2015-03-07 DIAGNOSIS — O09212 Supervision of pregnancy with history of pre-term labor, second trimester: Secondary | ICD-10-CM | POA: Diagnosis not present

## 2015-03-07 DIAGNOSIS — Z8751 Personal history of pre-term labor: Secondary | ICD-10-CM

## 2015-03-07 MED ORDER — HYDROXYPROGESTERONE CAPROATE 250 MG/ML IM OIL
250.0000 mg | TOPICAL_OIL | Freq: Once | INTRAMUSCULAR | Status: AC
  Administered 2015-03-07: 250 mg via INTRAMUSCULAR

## 2015-03-07 NOTE — Progress Notes (Signed)
Patient ID: Linda Reid, female   DOB: 09/20/1985, 29 y.o.   MRN: [id number] Pt states she has concerns about getting the 17 P injection today due to having a knot and soreness in left shoulder from last injection on 02/28/2015. Dr. Elonda Husky spoke with pt an visualized left shoulder and states WNL, suggested rotating injections site at each visit.   Hydroxyprogesterone Caproate 250 mg IM given right ventrogluteal with no complications. Pt states "did not hurt as bad."  Pt to return in 1 week for next 17 P injection.

## 2015-03-13 ENCOUNTER — Inpatient Hospital Stay (HOSPITAL_COMMUNITY)
Admission: AD | Admit: 2015-03-13 | Discharge: 2015-03-13 | Disposition: A | Discharge status: Home / Self Care | Payer: Medicaid Other | Source: Ambulatory Visit | Attending: Family Medicine | Admitting: Family Medicine

## 2015-03-13 ENCOUNTER — Encounter (HOSPITAL_COMMUNITY): Payer: Self-pay | Admitting: *Deleted

## 2015-03-13 DIAGNOSIS — J40 Bronchitis, not specified as acute or chronic: Secondary | ICD-10-CM | POA: Diagnosis not present

## 2015-03-13 DIAGNOSIS — O99512 Diseases of the respiratory system complicating pregnancy, second trimester: Secondary | ICD-10-CM | POA: Insufficient documentation

## 2015-03-13 DIAGNOSIS — R0981 Nasal congestion: Secondary | ICD-10-CM | POA: Diagnosis not present

## 2015-03-13 DIAGNOSIS — R05 Cough: Secondary | ICD-10-CM | POA: Diagnosis present

## 2015-03-13 DIAGNOSIS — Z3A24 24 weeks gestation of pregnancy: Secondary | ICD-10-CM | POA: Diagnosis not present

## 2015-03-13 MED ORDER — BENZONATATE 100 MG PO CAPS: 100.0000 mg | ORAL_CAPSULE | Freq: Three times a day (TID) | ORAL | Status: DC

## 2015-03-13 MED ORDER — AZITHROMYCIN 250 MG PO TABS: 250.0000 mg | ORAL_TABLET | Freq: Every day | ORAL | Status: DC

## 2015-03-13 NOTE — [facility] Provider Note (Signed)
First Provider Initiated Contact with Patient 03/13/15 2207      Chief Complaint:  Cough   Linda Reid is  29 y.o. [id number] at [redacted]w[redacted]d presents complaining of Cough She has had nasal congestion and cough for over a week and sx are getting worse. Has take OTC cough syrup and tylenol cold and sinus.    Obstetrical/Gynecological History: OB History    Gravida Para Term Preterm AB TAB SAB Ectopic Multiple Living   6 3 2 1 2  2   3      Past Medical History: History reviewed. No pertinent past medical history.  Past Surgical History: Past Surgical History  Procedure Laterality Date  . Tonsillectomy      Family History: Family History  Problem Relation Age of Onset  . Diabetes Maternal Aunt   . Hypertension Maternal Aunt   . Stroke Maternal Aunt   . Diabetes Maternal Uncle   . Hypertension Maternal Uncle   . Hypertension Paternal Aunt   . Hypertension Paternal Uncle   . Cancer Maternal Grandmother     breast  . Diabetes Maternal Grandmother   . Hypertension Maternal Grandmother   . Heart disease Maternal Grandfather   . Hypertension Maternal Grandfather   . Hypertension Paternal Grandmother   . Hypertension Paternal Grandfather     Social History: History  Substance Use Topics  . Smoking status: Never Smoker   . Smokeless tobacco: Never Used  . Alcohol Use: No    Allergies: No Known Allergies  Meds:  Prescriptions prior to admission  Medication Sig Dispense Refill Last Dose  . cetirizine (ZYRTEC) 10 MG tablet Take 10 mg by mouth daily as needed for allergies.    Past Week at Unknown time  . Doxylamine-Pyridoxine (DICLEGIS) 10-10 MG TBEC 2 tabs q hs, if sx persist add 1 tab q am on day 3, if sx persist add 1 tab q afternoon on day 4 100 tablet 6 Past Month at Unknown time  . Prenatal Vit-Fe Fumarate-FA (MULTIVITAMIN-PRENATAL) 27-0.8 MG TABS tablet Take 1 tablet by mouth daily at 12 noon.   03/12/2015 at Unknown time    Review of Systems   Constitutional:  Negative for fever and chills.  Has had a few "hot flashes" Eyes: Negative for visual disturbances Respiratory: Negative for shortness of breath, dyspnea. POSITIVE for dry cough Cardiovascular: Negative for chest pain or palpitations  Gastrointestinal: Negative for vomiting, diarrhea and constipation Genitourinary: Negative for dysuria and urgency Musculoskeletal: Negative for back pain, joint pain, myalgias.  Normal ROM  Neurological: Negative for dizziness and headaches    Physical Exam  Blood pressure 118/71, pulse 105, temperature 99.8 F (37.7 C), temperature source Oral, resp. rate 16, height 5\' 9"  (1.753 m), weight 78.472 kg (173 lb), last menstrual period 09/19/2014. GENERAL: Well-developed, well-nourished female in no acute distress.  LUNGS: no wheezes, rales/rhonchi. Respiratory effort normal with O2 Sat 97-98%.  HEART: Regular rate and rhythm. ABDOMEN: Soft, nontender, nondistended, gravid.  EXTREMITIES: Nontender, no edema, 2+ distal pulses. DTR's 2+ FHT 150 doppler  Labs: No results found for this or any previous visit (from the past 24 hour(s)). Imaging Studies:    Assessment: KALKIDAN CAUDELL is  29 y.o. [id number] at [redacted]w[redacted]d presents with bronchitis.  Plan: Zpack, Tesselon pearls.  F/U is sx deteriorate, SOB  CRESENZO-DISHMAN,Katyra Tomassetti 7/21/201610:14 PM

## 2015-03-14 ENCOUNTER — Encounter: Payer: Medicaid Other | Admitting: Obstetrics and Gynecology

## 2015-03-17 ENCOUNTER — Encounter: Payer: Self-pay | Admitting: Obstetrics and Gynecology

## 2015-03-17 ENCOUNTER — Ambulatory Visit (INDEPENDENT_AMBULATORY_CARE_PROVIDER_SITE_OTHER): Payer: Medicaid Other | Admitting: Obstetrics and Gynecology

## 2015-03-17 VITALS — BP 120/80 | HR 100 | Wt 172.5 lb

## 2015-03-17 DIAGNOSIS — Z331 Pregnant state, incidental: Secondary | ICD-10-CM

## 2015-03-17 DIAGNOSIS — O09212 Supervision of pregnancy with history of pre-term labor, second trimester: Secondary | ICD-10-CM

## 2015-03-17 DIAGNOSIS — O09892 Supervision of other high risk pregnancies, second trimester: Secondary | ICD-10-CM

## 2015-03-17 DIAGNOSIS — Z1389 Encounter for screening for other disorder: Secondary | ICD-10-CM

## 2015-03-17 MED ORDER — HYDROXYPROGESTERONE CAPROATE 250 MG/ML IM OIL
250.0000 mg | TOPICAL_OIL | Freq: Once | INTRAMUSCULAR | Status: AC
  Administered 2015-03-17: 250 mg via INTRAMUSCULAR

## 2015-03-17 MED ORDER — HYDROCODONE-HOMATROPINE 5-1.5 MG/5ML PO SYRP: 5.0000 mL | ORAL_SOLUTION | Freq: Four times a day (QID) | ORAL | Status: DC | PRN

## 2015-03-17 NOTE — Progress Notes (Signed)
Patient ID: Linda Reid, female   DOB: 1986/03/13, 29 y.o.   MRN: [id number]  [id number] [redacted]w[redacted]d Estimated Date of Delivery: 07/02/15  Blood pressure 120/80, pulse 100, weight 172 lb 8 oz (78.245 kg), last menstrual period 09/19/2014.   refer to the ob flow sheet for FH and FHR, also BP, Wt, Urine results:notable for negative  Patient reports  + good fetal movement, denies any bleeding and no rupture of membranes symptoms or regular contractions. Patient complaints: Pt reports increased pressure which started after onset of congestion and cough last week. She states fluid loss that occurs with coughing as an associated symptom. Pt was seen for the same at [facility] and was treated with Tessalon and Zithromax. Pt's first child was born at 46 weeks after PROM. Her second two children were each born a few weeks early. Pt is on 17-P.  FH-25 cm FHR-147 bpm Cervix closed, but short  Questions were answered. Assessment: [redacted]w[redacted]d, [id number] hx preterm birth on 17-P weekly  Plan:  Continued routine obstetrical care. Continue 17-P  F/u in 1 weeks for 17-P. Follow-up in 2 weeks for routine care.   This chart was scribed for Jonnie Kind, MD by Tula Nakayama, Medical Scribe. This patient was seen in room 1 and the patient's care was started at 2:50 PM.   I personally performed the services described in this documentation, which was SCRIBED in my presence. The recorded information has been reviewed and considered accurate. It has been edited as necessary during review. Jonnie Kind, MD

## 2015-03-17 NOTE — Progress Notes (Signed)
Pt states that the past 2 days she has felt pressure and fluid when coughing.

## 2015-03-24 ENCOUNTER — Encounter: Payer: Self-pay | Admitting: *Deleted

## 2015-03-24 ENCOUNTER — Ambulatory Visit (INDEPENDENT_AMBULATORY_CARE_PROVIDER_SITE_OTHER): Payer: Medicaid Other | Admitting: *Deleted

## 2015-03-24 VITALS — BP 124/56 | HR 76 | Wt 176.0 lb

## 2015-03-24 DIAGNOSIS — O09892 Supervision of other high risk pregnancies, second trimester: Secondary | ICD-10-CM

## 2015-03-24 DIAGNOSIS — Z1389 Encounter for screening for other disorder: Secondary | ICD-10-CM

## 2015-03-24 DIAGNOSIS — O09212 Supervision of pregnancy with history of pre-term labor, second trimester: Secondary | ICD-10-CM

## 2015-03-24 DIAGNOSIS — Z331 Pregnant state, incidental: Secondary | ICD-10-CM

## 2015-03-24 LAB — POCT URINALYSIS DIPSTICK
Glucose, UA: NEGATIVE
KETONES UA: NEGATIVE
Leukocytes, UA: NEGATIVE
Nitrite, UA: NEGATIVE
Protein, UA: NEGATIVE
RBC UA: NEGATIVE

## 2015-03-24 MED ORDER — HYDROXYPROGESTERONE CAPROATE 250 MG/ML IM OIL
250.0000 mg | TOPICAL_OIL | Freq: Once | INTRAMUSCULAR | Status: AC
  Administered 2015-03-24: 250 mg via INTRAMUSCULAR

## 2015-04-01 ENCOUNTER — Ambulatory Visit (INDEPENDENT_AMBULATORY_CARE_PROVIDER_SITE_OTHER): Payer: Medicaid Other | Admitting: Women's Health

## 2015-04-01 ENCOUNTER — Other Ambulatory Visit: Payer: Self-pay | Admitting: Women's Health

## 2015-04-01 ENCOUNTER — Encounter: Payer: Self-pay | Admitting: Women's Health

## 2015-04-01 VITALS — BP 106/56 | HR 84 | Wt 175.0 lb

## 2015-04-01 DIAGNOSIS — O26892 Other specified pregnancy related conditions, second trimester: Secondary | ICD-10-CM

## 2015-04-01 DIAGNOSIS — R102 Pelvic and perineal pain: Secondary | ICD-10-CM

## 2015-04-01 DIAGNOSIS — N949 Unspecified condition associated with female genital organs and menstrual cycle: Secondary | ICD-10-CM

## 2015-04-01 DIAGNOSIS — Z331 Pregnant state, incidental: Secondary | ICD-10-CM

## 2015-04-01 DIAGNOSIS — B373 Candidiasis of vulva and vagina: Secondary | ICD-10-CM

## 2015-04-01 DIAGNOSIS — O09892 Supervision of other high risk pregnancies, second trimester: Secondary | ICD-10-CM

## 2015-04-01 DIAGNOSIS — Z1389 Encounter for screening for other disorder: Secondary | ICD-10-CM

## 2015-04-01 DIAGNOSIS — O09212 Supervision of pregnancy with history of pre-term labor, second trimester: Secondary | ICD-10-CM

## 2015-04-01 DIAGNOSIS — N898 Other specified noninflammatory disorders of vagina: Secondary | ICD-10-CM | POA: Diagnosis not present

## 2015-04-01 DIAGNOSIS — O26872 Cervical shortening, second trimester: Secondary | ICD-10-CM

## 2015-04-01 DIAGNOSIS — Z3492 Encounter for supervision of normal pregnancy, unspecified, second trimester: Secondary | ICD-10-CM

## 2015-04-01 DIAGNOSIS — B3731 Acute candidiasis of vulva and vagina: Secondary | ICD-10-CM

## 2015-04-01 LAB — POCT WET PREP (WET MOUNT): Clue Cells Wet Prep Whiff POC: NEGATIVE

## 2015-04-01 LAB — POCT URINALYSIS DIPSTICK
Glucose, UA: NEGATIVE
Ketones, UA: NEGATIVE
NITRITE UA: NEGATIVE
Protein, UA: NEGATIVE
RBC UA: NEGATIVE

## 2015-04-01 MED ORDER — HYDROXYPROGESTERONE CAPROATE 250 MG/ML IM OIL
250.0000 mg | TOPICAL_OIL | Freq: Once | INTRAMUSCULAR | Status: AC
  Administered 2015-04-01: 250 mg via INTRAMUSCULAR

## 2015-04-01 NOTE — Progress Notes (Signed)
Low-risk OB appointment [id number] [redacted]w[redacted]d Estimated Date of Delivery: 07/02/15 BP 106/56 mmHg  Pulse 84  Wt 175 lb (79.379 kg)  LMP 09/19/2014 (Exact Date)  BP, weight, and urine reviewed.  Refer to obstetrical flow sheet for FH & FHR.  Reports good fm.  Denies regular uc's, lof, vb, or uti s/s. Lots of pelvic pressure and LBP- more pressure than last visit. H/O 26wk ptb d/t ptl.  Some vaginal itching x 2-3 days.  Spec exam: cx visually closed, thick clumpy white nonodorous d/c, fFN collected, wet prep: +yeast, otherwise neg, to use otc monistat 7 for yeast SVE: closed/70/ballotalbe Reviewed ptl s/s, fm, pelvic rest recommended- works at [employer]- doesn't play at all. Plan:  Continue routine obstetrical care  F/U in 1d for for CL u/s and f/u after, then 1wk for pn2 & visit Continue weekly Makena

## 2015-04-01 NOTE — Patient Instructions (Addendum)
You will have your sugar test next visit.  Please do not eat or drink anything after midnight the night before you come, not even water.  You will be here for at least two hours.     Monistat 7 for yeast infection  Call the office [phone]) or go to [facility] if:  You begin to have strong, frequent contractions  Your water breaks.  Sometimes it is a big gush of fluid, sometimes it is just a trickle that keeps getting your panties wet or running down your legs  You have vaginal bleeding.  It is normal to have a small amount of spotting if your cervix was checked.   You don't feel your baby moving like normal.  If you don't, get you something to eat and drink and lay down and focus on feeling your baby move.   If your baby is still not moving like normal, you should call the office or go to [facility] The second trimester is from week 13 through week 28, months 4 through 6. The second trimester is often a time when you feel your best. Your body has also adjusted to being pregnant, and you begin to feel better physically. Usually, morning sickness has lessened or quit completely, you may have more energy, and you may have an increase in appetite. The second trimester is also a time when the fetus is growing rapidly. At the end of the sixth month, the fetus is about 9 inches long and weighs about 1 pounds. You will likely begin to feel the baby move (quickening) between 18 and 20 weeks of the pregnancy. BODY CHANGES Your body goes through many changes during pregnancy. The changes vary from woman to woman.   Your weight will continue to increase. You will notice your lower abdomen bulging out.  You may begin to get stretch marks on your hips, abdomen, and breasts.  You may develop headaches that can be relieved by medicines approved by your health care provider.  You may urinate more often because the fetus is pressing on your bladder.  You may  develop or continue to have heartburn as a result of your pregnancy.  You may develop constipation because certain hormones are causing the muscles that push waste through your intestines to slow down.  You may develop hemorrhoids or swollen, bulging veins (varicose veins).  You may have back pain because of the weight gain and pregnancy hormones relaxing your joints between the bones in your pelvis and as a result of a shift in weight and the muscles that support your balance.  Your breasts will continue to grow and be tender.  Your gums may bleed and may be sensitive to brushing and flossing.  Dark spots or blotches (chloasma, mask of pregnancy) may develop on your face. This will likely fade after the baby is born.  A dark line from your belly button to the pubic area (linea nigra) may appear. This will likely fade after the baby is born.  You may have changes in your hair. These can include thickening of your hair, rapid growth, and changes in texture. Some women also have hair loss during or after pregnancy, or hair that feels dry or thin. Your hair will most likely return to normal after your baby is born. WHAT TO EXPECT AT YOUR PRENATAL VISITS During a routine prenatal visit:  You will be weighed to make sure you and the fetus are growing normally.  Your  blood pressure will be taken.  Your abdomen will be measured to track your baby's growth.  The fetal heartbeat will be listened to.  Any test results from the previous visit will be discussed. Your health care provider may ask you:  How you are feeling.  If you are feeling the baby move.  If you have had any abnormal symptoms, such as leaking fluid, bleeding, severe headaches, or abdominal cramping.  If you have any questions. Other tests that may be performed during your second trimester include:  Blood tests that check for:  Low iron levels (anemia).  Gestational diabetes (between 24 and 28 weeks).  Rh  antibodies.  Urine tests to check for infections, diabetes, or protein in the urine.  An ultrasound to confirm the proper growth and development of the baby.  An amniocentesis to check for possible genetic problems.  Fetal screens for spina bifida and Down syndrome. HOME CARE INSTRUCTIONS   Avoid all smoking, herbs, alcohol, and unprescribed drugs. These chemicals affect the formation and growth of the baby.  Follow your health care provider's instructions regarding medicine use. There are medicines that are either safe or unsafe to take during pregnancy.  Exercise only as directed by your health care provider. Experiencing uterine cramps is a good sign to stop exercising.  Continue to eat regular, healthy meals.  Wear a good support bra for breast tenderness.  Do not use hot tubs, steam rooms, or saunas.  Wear your seat belt at all times when driving.  Avoid raw meat, uncooked cheese, cat litter boxes, and soil used by cats. These carry germs that can cause birth defects in the baby.  Take your prenatal vitamins.  Try taking a stool softener (if your health care provider approves) if you develop constipation. Eat more high-fiber foods, such as fresh vegetables or fruit and whole grains. Drink plenty of fluids to keep your urine clear or pale yellow.  Take warm sitz baths to soothe any pain or discomfort caused by hemorrhoids. Use hemorrhoid cream if your health care provider approves.  If you develop varicose veins, wear support hose. Elevate your feet for 15 minutes, 3-4 times a day. Limit salt in your diet.  Avoid heavy lifting, wear low heel shoes, and practice good posture.  Rest with your legs elevated if you have leg cramps or low back pain.  Visit your dentist if you have not gone yet during your pregnancy. Use a soft toothbrush to brush your teeth and be gentle when you floss.  A sexual relationship may be continued unless your health care provider directs you  otherwise.  Continue to go to all your prenatal visits as directed by your health care provider. SEEK MEDICAL CARE IF:   You have dizziness.  You have mild pelvic cramps, pelvic pressure, or nagging pain in the abdominal area.  You have persistent nausea, vomiting, or diarrhea.  You have a bad smelling vaginal discharge.  You have pain with urination. SEEK IMMEDIATE MEDICAL CARE IF:   You have a fever.  You are leaking fluid from your vagina.  You have spotting or bleeding from your vagina.  You have severe abdominal cramping or pain.  You have rapid weight gain or loss.  You have shortness of breath with chest pain.  You notice sudden or extreme swelling of your face, hands, ankles, feet, or legs.  You have not felt your baby move in over an hour.  You have severe headaches that do not go away with  medicine.  You have vision changes. Document Released: 08/03/2001 Document Revised: 08/14/2013 Document Reviewed: 10/10/2012 [facility] Patient Information 2015 [place], [place]. This information is not intended to replace advice given to you by your health care provider. Make sure you discuss any questions you have with your health care provider.  Preterm Labor Information Preterm labor is when labor starts at less than 37 weeks of pregnancy. The normal length of a pregnancy is 39 to 41 weeks. CAUSES Often, there is no identifiable underlying cause as to why a woman goes into preterm labor. One of the most common known causes of preterm labor is infection. Infections of the uterus, cervix, vagina, amniotic sac, bladder, kidney, or even the lungs (pneumonia) can cause labor to start. Other suspected causes of preterm labor include:   Urogenital infections, such as yeast infections and bacterial vaginosis.   Uterine abnormalities (uterine shape, uterine septum, fibroids, or bleeding from the placenta).   A cervix that has been operated on (it may fail to stay closed).    Malformations in the fetus.   Multiple gestations (twins, triplets, and so on).   Breakage of the amniotic sac.  RISK FACTORS  Having a previous history of preterm labor.   Having premature rupture of membranes (PROM).   Having a placenta that covers the opening of the cervix (placenta previa).   Having a placenta that separates from the uterus (placental abruption).   Having a cervix that is too weak to hold the fetus in the uterus (incompetent cervix).   Having too much fluid in the amniotic sac (polyhydramnios).   Taking illegal drugs or smoking while pregnant.   Not gaining enough weight while pregnant.   Being younger than 23 and older than 31 years old.   Having a low socioeconomic status.   Being African American. SYMPTOMS Signs and symptoms of preterm labor include:   Menstrual-like cramps, abdominal pain, or back pain.  Uterine contractions that are regular, as frequent as six in an hour, regardless of their intensity (may be mild or painful).  Contractions that start on the top of the uterus and spread down to the lower abdomen and back.   A sense of increased pelvic pressure.   A watery or bloody mucus discharge that comes from the vagina.  TREATMENT Depending on the length of the pregnancy and other circumstances, your health care provider may suggest bed rest. If necessary, there are medicines that can be given to stop contractions and to mature the fetal lungs. If labor happens before 34 weeks of pregnancy, a prolonged hospital stay may be recommended. Treatment depends on the condition of both you and the fetus.  WHAT SHOULD YOU DO IF YOU THINK YOU ARE IN PRETERM LABOR? Call your health care provider right away. You will need to go to the hospital to get checked immediately. HOW CAN YOU PREVENT PRETERM LABOR IN FUTURE PREGNANCIES? You should:   Stop smoking if you smoke.  Maintain healthy weight gain and avoid chemicals and drugs  that are not necessary.  Be watchful for any type of infection.  Inform your health care provider if you have a known history of preterm labor. Document Released: 10/30/2003 Document Revised: 04/11/2013 Document Reviewed: 09/11/2012 [facility] Patient Information 2015 [place], [place]. This information is not intended to replace advice given to you by your health care provider. Make sure you discuss any questions you have with your health care provider.

## 2015-04-02 ENCOUNTER — Ambulatory Visit (INDEPENDENT_AMBULATORY_CARE_PROVIDER_SITE_OTHER): Payer: Medicaid Other

## 2015-04-02 DIAGNOSIS — O09212 Supervision of pregnancy with history of pre-term labor, second trimester: Secondary | ICD-10-CM

## 2015-04-02 DIAGNOSIS — O09892 Supervision of other high risk pregnancies, second trimester: Secondary | ICD-10-CM

## 2015-04-02 DIAGNOSIS — O283 Abnormal ultrasonic finding on antenatal screening of mother: Secondary | ICD-10-CM

## 2015-04-02 DIAGNOSIS — Z3492 Encounter for supervision of normal pregnancy, unspecified, second trimester: Secondary | ICD-10-CM

## 2015-04-02 DIAGNOSIS — O26872 Cervical shortening, second trimester: Secondary | ICD-10-CM | POA: Diagnosis not present

## 2015-04-02 LAB — FETAL FIBRONECTIN: Fetal Fibronectin: NEGATIVE

## 2015-04-02 NOTE — Progress Notes (Signed)
[name] CX LENGTH: 27wks,cephalic,fht [id number],ZMOQ pl gr 1, normal ov's bilat,svp 4.5cm,cx 3.4 w and w/o pressure.

## 2015-04-03 LAB — GC/CHLAMYDIA PROBE AMP
Chlamydia trachomatis, NAA: NEGATIVE
Neisseria gonorrhoeae by PCR: NEGATIVE

## 2015-04-09 ENCOUNTER — Encounter: Payer: Self-pay | Admitting: Women's Health

## 2015-04-09 ENCOUNTER — Other Ambulatory Visit: Payer: Self-pay | Admitting: Obstetrics and Gynecology

## 2015-04-09 ENCOUNTER — Other Ambulatory Visit: Payer: Medicaid Other

## 2015-04-09 ENCOUNTER — Ambulatory Visit (INDEPENDENT_AMBULATORY_CARE_PROVIDER_SITE_OTHER): Payer: Medicaid Other | Admitting: Women's Health

## 2015-04-09 VITALS — BP 110/54 | HR 72 | Wt 176.0 lb

## 2015-04-09 DIAGNOSIS — O09213 Supervision of pregnancy with history of pre-term labor, third trimester: Secondary | ICD-10-CM

## 2015-04-09 DIAGNOSIS — Z3493 Encounter for supervision of normal pregnancy, unspecified, third trimester: Secondary | ICD-10-CM

## 2015-04-09 DIAGNOSIS — Z331 Pregnant state, incidental: Secondary | ICD-10-CM

## 2015-04-09 DIAGNOSIS — Z1389 Encounter for screening for other disorder: Secondary | ICD-10-CM

## 2015-04-09 DIAGNOSIS — O09893 Supervision of other high risk pregnancies, third trimester: Secondary | ICD-10-CM

## 2015-04-09 LAB — POCT URINALYSIS DIPSTICK
KETONES UA: NEGATIVE
Leukocytes, UA: NEGATIVE
NITRITE UA: NEGATIVE
PROTEIN UA: NEGATIVE
RBC UA: NEGATIVE

## 2015-04-09 MED ORDER — HYDROXYPROGESTERONE CAPROATE 250 MG/ML IM OIL
250.0000 mg | TOPICAL_OIL | Freq: Once | INTRAMUSCULAR | Status: AC
  Administered 2015-04-09: 250 mg via INTRAMUSCULAR

## 2015-04-09 NOTE — Patient Instructions (Addendum)
Call the office [phone]) or go to [facility] if:  You begin to have strong, frequent contractions  Your water breaks.  Sometimes it is a big gush of fluid, sometimes it is just a trickle that keeps getting your panties wet or running down your legs  You have vaginal bleeding.  It is normal to have a small amount of spotting if your cervix was checked.   You don't feel your baby moving like normal.  If you don't, get you something to eat and drink and lay down and focus on feeling your baby move.  You should feel at least 10 movements in 2 hours.  If you don't, you should call the office or go to [facility].    Tdap Vaccine  It is recommended that you get the Tdap vaccine during the third trimester of EACH pregnancy to help protect your baby from getting pertussis (whooping cough)  27-36 weeks is the BEST time to do this so that you can pass the protection on to your baby. During pregnancy is better than after pregnancy, but if you are unable to get it during pregnancy it will be offered at the hospital.   You can get this vaccine at the health department or your family doctor  Everyone who will be around your baby should also be up-to-date on their vaccines. Adults (who are not pregnant) only need 1 dose of Tdap during adulthood.   For your lower back pain you may:  Purchase a pregnancy belt from [employer] [name], [employer], [employer], etc and wear it while you are up and about  Take warm baths  Use a heating pad to your lower back for no longer than 20 minutes at a time, and do not place near abdomen  Take tylenol as needed. Please follow directions on the bottle  Third Trimester of Pregnancy The third trimester is from week 29 through week 42, months 7 through 9. The third trimester is a time when the fetus is growing rapidly. At the end of the ninth month, the fetus is about 20 inches in length and weighs 6-10 pounds.  BODY CHANGES Your body goes through many  changes during pregnancy. The changes vary from woman to woman.   Your weight will continue to increase. You can expect to gain 25-35 pounds (11-16 kg) by the end of the pregnancy.  You may begin to get stretch marks on your hips, abdomen, and breasts.  You may urinate more often because the fetus is moving lower into your pelvis and pressing on your bladder.  You may develop or continue to have heartburn as a result of your pregnancy.  You may develop constipation because certain hormones are causing the muscles that push waste through your intestines to slow down.  You may develop hemorrhoids or swollen, bulging veins (varicose veins).  You may have pelvic pain because of the weight gain and pregnancy hormones relaxing your joints between the bones in your pelvis. Backaches may result from overexertion of the muscles supporting your posture.  You may have changes in your hair. These can include thickening of your hair, rapid growth, and changes in texture. Some women also have hair loss during or after pregnancy, or hair that feels dry or thin. Your hair will most likely return to normal after your baby is born.  Your breasts will continue to grow and be tender. A yellow discharge may leak from your breasts called colostrum.  Your belly button may stick out.  You may feel short of breath because of your expanding uterus.  You may notice the fetus "dropping," or moving lower in your abdomen.  You may have a bloody mucus discharge. This usually occurs a few days to a week before labor begins.  Your cervix becomes thin and soft (effaced) near your due date. WHAT TO EXPECT AT YOUR PRENATAL EXAMS  You will have prenatal exams every 2 weeks until week 36. Then, you will have weekly prenatal exams. During a routine prenatal visit:  You will be weighed to make sure you and the fetus are growing normally.  Your blood pressure is taken.  Your abdomen will be measured to track your baby's  growth.  The fetal heartbeat will be listened to.  Any test results from the previous visit will be discussed.  You may have a cervical check near your due date to see if you have effaced. At around 36 weeks, your caregiver will check your cervix. At the same time, your caregiver will also perform a test on the secretions of the vaginal tissue. This test is to determine if a type of bacteria, Group B streptococcus, is present. Your caregiver will explain this further. Your caregiver may ask you:  What your birth plan is.  How you are feeling.  If you are feeling the baby move.  If you have had any abnormal symptoms, such as leaking fluid, bleeding, severe headaches, or abdominal cramping.  If you have any questions. Other tests or screenings that may be performed during your third trimester include:  Blood tests that check for low iron levels (anemia).  Fetal testing to check the health, activity level, and growth of the fetus. Testing is done if you have certain medical conditions or if there are problems during the pregnancy. FALSE LABOR You may feel small, irregular contractions that eventually go away. These are called Braxton Hicks contractions, or false labor. Contractions may last for hours, days, or even weeks before true labor sets in. If contractions come at regular intervals, intensify, or become painful, it is best to be seen by your caregiver.  SIGNS OF LABOR  5. Menstrual-like cramps. 6. Contractions that are 5 minutes apart or less. 7. Contractions that start on the top of the uterus and spread down to the lower abdomen and back. 8. A sense of increased pelvic pressure or back pain. 9. A watery or bloody mucus discharge that comes from the vagina. If you have any of these signs before the 37th week of pregnancy, call your caregiver right away. You need to go to the hospital to get checked immediately. HOME CARE INSTRUCTIONS   Avoid all smoking, herbs, alcohol, and  unprescribed drugs. These chemicals affect the formation and growth of the baby.  Follow your caregiver's instructions regarding medicine use. There are medicines that are either safe or unsafe to take during pregnancy.  Exercise only as directed by your caregiver. Experiencing uterine cramps is a good sign to stop exercising.  Continue to eat regular, healthy meals.  Wear a good support bra for breast tenderness.  Do not use hot tubs, steam rooms, or saunas.  Wear your seat belt at all times when driving.  Avoid raw meat, uncooked cheese, cat litter boxes, and soil used by cats. These carry germs that can cause birth defects in the baby.  Take your prenatal vitamins.  Try taking a stool softener (if your caregiver approves) if you develop constipation. Eat more high-fiber foods, such as fresh vegetables or  fruit and whole grains. Drink plenty of fluids to keep your urine clear or pale yellow.  Take warm sitz baths to soothe any pain or discomfort caused by hemorrhoids. Use hemorrhoid cream if your caregiver approves.  If you develop varicose veins, wear support hose. Elevate your feet for 15 minutes, 3-4 times a day. Limit salt in your diet.  Avoid heavy lifting, wear low heal shoes, and practice good posture.  Rest a lot with your legs elevated if you have leg cramps or low back pain.  Visit your dentist if you have not gone during your pregnancy. Use a soft toothbrush to brush your teeth and be gentle when you floss.  A sexual relationship may be continued unless your caregiver directs you otherwise.  Do not travel far distances unless it is absolutely necessary and only with the approval of your caregiver.  Take prenatal classes to understand, practice, and ask questions about the labor and delivery.  Make a trial run to the hospital.  Pack your hospital bag.  Prepare the baby's nursery.  Continue to go to all your prenatal visits as directed by your caregiver. SEEK  MEDICAL CARE IF:  You are unsure if you are in labor or if your water has broken.  You have dizziness.  You have mild pelvic cramps, pelvic pressure, or nagging pain in your abdominal area.  You have persistent nausea, vomiting, or diarrhea.  You have a bad smelling vaginal discharge.  You have pain with urination. SEEK IMMEDIATE MEDICAL CARE IF:   You have a fever.  You are leaking fluid from your vagina.  You have spotting or bleeding from your vagina.  You have severe abdominal cramping or pain.  You have rapid weight loss or gain.  You have shortness of breath with chest pain.  You notice sudden or extreme swelling of your face, hands, ankles, feet, or legs.  You have not felt your baby move in over an hour.  You have severe headaches that do not go away with medicine.  You have vision changes. Document Released: 08/03/2001 Document Revised: 08/14/2013 Document Reviewed: 10/10/2012 [facility] Patient Information 2015 [place], [place]. This information is not intended to replace advice given to you by your health care provider. Make sure you discuss any questions you have with your health care provider.

## 2015-04-09 NOTE — Progress Notes (Signed)
Low-risk OB appointment [id number] [redacted]w[redacted]d Estimated Date of Delivery: 07/02/15 BP 110/54 mmHg  Pulse 72  Wt 176 lb (79.833 kg)  LMP 09/19/2014 (Exact Date)  BP, weight, and urine reviewed.  Refer to obstetrical flow sheet for FH & FHR.  Reports good fm.  Denies regular uc's, lof, vb, or uti s/s. Still w/ some pressure and LBP- hasn't gotten any worse since last visit. fFN neg last visit, CL was 3.4cm, getting weekly Makena. Will defer exam unless sx start to worsen. 31yo who she has custody of hasn't been vaccinated for varicella- pt wanted to know if sister could get it while pt pregnant- per CDC this is OK Reviewed ptl s/s, fkc. Recommended Tdap at HD/PCP per CDC guidelines.  Plan:  Continue routine obstetrical care  F/U in 4wks for OB appointment, weekly Makena PN2 today

## 2015-04-10 LAB — CBC
HEMOGLOBIN: 12.3 g/dL (ref 11.1–15.9)
Hematocrit: 36.2 % (ref 34.0–46.6)
MCH: 30.9 pg (ref 26.6–33.0)
MCHC: 34 g/dL (ref 31.5–35.7)
MCV: 91 fL (ref 79–97)
PLATELETS: 209 10*3/uL (ref 150–379)
RBC: 3.98 x10E6/uL (ref 3.77–5.28)
RDW: 12.8 % (ref 12.3–15.4)
WBC: 8.6 10*3/uL (ref 3.4–10.8)

## 2015-04-10 LAB — ANTIBODY SCREEN: Antibody Screen: NEGATIVE

## 2015-04-10 LAB — GLUCOSE TOLERANCE, 2 HOURS W/ 1HR
GLUCOSE, 1 HOUR: 122 mg/dL (ref 65–179)
GLUCOSE, FASTING: 83 mg/dL (ref 65–91)
Glucose, 2 hour: 90 mg/dL (ref 65–152)

## 2015-04-10 LAB — HSV 2 ANTIBODY, IGG

## 2015-04-10 LAB — RPR: RPR Ser Ql: NONREACTIVE

## 2015-04-10 LAB — HIV ANTIBODY (ROUTINE TESTING W REFLEX): HIV Screen 4th Generation wRfx: NONREACTIVE

## 2015-04-14 ENCOUNTER — Telehealth: Payer: Self-pay | Admitting: Women's Health

## 2015-04-14 NOTE — Telephone Encounter (Signed)
Pt states she is having lower back pain and pelvic pressure and she feels like the baby is a lot lower than she has been.  Pt reports good FM, denies any contractions, loss of fluids or bleeding.  Advised pt to push fluids, rest when can and take Tylenol.  If symptoms worsen or new symptoms develop call us back or if has sudden gush of fluids or consistent contractions that are 5 - 7 minutes apart to go to [facility].  Otherwise she is to keep her appointment here with [name] on Wednesday 8/24.  Pt verbalized understanding.

## 2015-04-16 ENCOUNTER — Encounter: Payer: Self-pay | Admitting: *Deleted

## 2015-04-16 ENCOUNTER — Ambulatory Visit (INDEPENDENT_AMBULATORY_CARE_PROVIDER_SITE_OTHER): Payer: Medicaid Other | Admitting: *Deleted

## 2015-04-16 ENCOUNTER — Ambulatory Visit (INDEPENDENT_AMBULATORY_CARE_PROVIDER_SITE_OTHER): Payer: Medicaid Other | Admitting: Advanced Practice Midwife

## 2015-04-16 VITALS — BP 104/64 | HR 80 | Wt 177.5 lb

## 2015-04-16 VITALS — BP 104/62 | HR 84 | Ht 69.0 in | Wt 177.5 lb

## 2015-04-16 DIAGNOSIS — O283 Abnormal ultrasonic finding on antenatal screening of mother: Secondary | ICD-10-CM

## 2015-04-16 DIAGNOSIS — Z1389 Encounter for screening for other disorder: Secondary | ICD-10-CM

## 2015-04-16 DIAGNOSIS — Z331 Pregnant state, incidental: Secondary | ICD-10-CM

## 2015-04-16 DIAGNOSIS — O09893 Supervision of other high risk pregnancies, third trimester: Secondary | ICD-10-CM

## 2015-04-16 DIAGNOSIS — Z3493 Encounter for supervision of normal pregnancy, unspecified, third trimester: Secondary | ICD-10-CM

## 2015-04-16 DIAGNOSIS — O09213 Supervision of pregnancy with history of pre-term labor, third trimester: Secondary | ICD-10-CM

## 2015-04-16 LAB — POCT URINALYSIS DIPSTICK
Blood, UA: NEGATIVE
GLUCOSE UA: NEGATIVE
Ketones, UA: NEGATIVE
LEUKOCYTES UA: NEGATIVE
NITRITE UA: NEGATIVE
Protein, UA: NEGATIVE

## 2015-04-16 MED ORDER — HYDROXYPROGESTERONE CAPROATE 250 MG/ML IM OIL
250.0000 mg | TOPICAL_OIL | Freq: Once | INTRAMUSCULAR | Status: AC
  Administered 2015-04-16: 250 mg via INTRAMUSCULAR

## 2015-04-16 NOTE — Progress Notes (Signed)
WORK IN for c/o pressure, irregular contractions (1-2 hour) today. fFn neg 8/9.  Recollected today. CX:  Long with outer os open, inner os closed.  Maternity belt recommended.

## 2015-04-16 NOTE — Progress Notes (Signed)
Pt here for 17P. Reports having irregular contractions and pressure yesterday. No bleeding and no gush of fluid. Pt reports good baby movement. I advised I would feel better if pt was seen today. Pt can come back after 1:00 pm today. Pt to get appt for today at check out. Return in 1 week for next 17P. [place]

## 2015-04-17 LAB — FETAL FIBRONECTIN: Fetal Fibronectin: NEGATIVE

## 2015-04-23 ENCOUNTER — Ambulatory Visit (INDEPENDENT_AMBULATORY_CARE_PROVIDER_SITE_OTHER): Payer: Medicaid Other | Admitting: *Deleted

## 2015-04-23 ENCOUNTER — Encounter: Payer: Self-pay | Admitting: *Deleted

## 2015-04-23 VITALS — BP 120/74 | HR 88 | Ht 69.0 in | Wt 178.0 lb

## 2015-04-23 DIAGNOSIS — O09213 Supervision of pregnancy with history of pre-term labor, third trimester: Secondary | ICD-10-CM

## 2015-04-23 DIAGNOSIS — O09893 Supervision of other high risk pregnancies, third trimester: Secondary | ICD-10-CM

## 2015-04-23 DIAGNOSIS — O283 Abnormal ultrasonic finding on antenatal screening of mother: Secondary | ICD-10-CM

## 2015-04-23 DIAGNOSIS — Z1389 Encounter for screening for other disorder: Secondary | ICD-10-CM

## 2015-04-23 DIAGNOSIS — Z3493 Encounter for supervision of normal pregnancy, unspecified, third trimester: Secondary | ICD-10-CM

## 2015-04-23 DIAGNOSIS — Z331 Pregnant state, incidental: Secondary | ICD-10-CM

## 2015-04-23 LAB — POCT URINALYSIS DIPSTICK
Glucose, UA: NEGATIVE
KETONES UA: NEGATIVE
LEUKOCYTES UA: NEGATIVE
NITRITE UA: NEGATIVE
PROTEIN UA: NEGATIVE
RBC UA: NEGATIVE

## 2015-04-23 MED ORDER — HYDROXYPROGESTERONE CAPROATE 250 MG/ML IM OIL
250.0000 mg | TOPICAL_OIL | Freq: Once | INTRAMUSCULAR | Status: AC
  Administered 2015-04-23: 250 mg via INTRAMUSCULAR

## 2015-04-23 NOTE — Progress Notes (Signed)
Pt here for 17P. Pt had a contraction yesterday and she spoke with Dr. Glo Herring and he advised as long as contractions are just here and there, and no change in discharge, everything was fine at this point. Advised to let us know if she has any changes. Pt voiced understanding. Return in 1 week for next shot. [place]

## 2015-04-30 ENCOUNTER — Ambulatory Visit (INDEPENDENT_AMBULATORY_CARE_PROVIDER_SITE_OTHER): Payer: Medicaid Other | Admitting: *Deleted

## 2015-04-30 ENCOUNTER — Encounter: Payer: Self-pay | Admitting: *Deleted

## 2015-04-30 VITALS — BP 110/68 | HR 84 | Ht 69.0 in | Wt 178.5 lb

## 2015-04-30 DIAGNOSIS — O09213 Supervision of pregnancy with history of pre-term labor, third trimester: Secondary | ICD-10-CM

## 2015-04-30 DIAGNOSIS — Z1389 Encounter for screening for other disorder: Secondary | ICD-10-CM

## 2015-04-30 DIAGNOSIS — Z331 Pregnant state, incidental: Secondary | ICD-10-CM

## 2015-04-30 DIAGNOSIS — Z3493 Encounter for supervision of normal pregnancy, unspecified, third trimester: Secondary | ICD-10-CM

## 2015-04-30 DIAGNOSIS — O283 Abnormal ultrasonic finding on antenatal screening of mother: Secondary | ICD-10-CM

## 2015-04-30 DIAGNOSIS — O09893 Supervision of other high risk pregnancies, third trimester: Secondary | ICD-10-CM

## 2015-04-30 LAB — POCT URINALYSIS DIPSTICK
Blood, UA: NEGATIVE
GLUCOSE UA: NEGATIVE
Glucose, UA: NEGATIVE
Ketones, UA: NEGATIVE
LEUKOCYTES UA: NEGATIVE
NITRITE UA: NEGATIVE
PROTEIN UA: NEGATIVE
Protein, UA: NEGATIVE

## 2015-04-30 MED ORDER — HYDROXYPROGESTERONE CAPROATE 250 MG/ML IM OIL
250.0000 mg | TOPICAL_OIL | Freq: Once | INTRAMUSCULAR | Status: AC
  Administered 2015-04-30: 250 mg via INTRAMUSCULAR

## 2015-04-30 NOTE — Progress Notes (Signed)
Pt here for 17P. Reports no new problems at this time. Return in 1 week for next shot. [place]

## 2015-05-05 ENCOUNTER — Telehealth: Payer: Self-pay | Admitting: Women's Health

## 2015-05-05 DIAGNOSIS — O283 Abnormal ultrasonic finding on antenatal screening of mother: Secondary | ICD-10-CM

## 2015-05-05 DIAGNOSIS — Z3493 Encounter for supervision of normal pregnancy, unspecified, third trimester: Secondary | ICD-10-CM

## 2015-05-05 NOTE — Telephone Encounter (Signed)
Pt states that she is really stopped up. Pt states that she has taken her zyrtec, and sudafed and nothing seems to be helping. Pt states that she has not had any fever just cough, and head congestion.   Phone call switched to front office and appointment for tomorrow was given.

## 2015-05-06 ENCOUNTER — Ambulatory Visit (INDEPENDENT_AMBULATORY_CARE_PROVIDER_SITE_OTHER): Payer: Medicaid Other | Admitting: Advanced Practice Midwife

## 2015-05-06 VITALS — BP 110/58 | HR 88 | Wt 180.0 lb

## 2015-05-06 DIAGNOSIS — J218 Acute bronchiolitis due to other specified organisms: Secondary | ICD-10-CM

## 2015-05-06 DIAGNOSIS — Z1389 Encounter for screening for other disorder: Secondary | ICD-10-CM

## 2015-05-06 DIAGNOSIS — Z331 Pregnant state, incidental: Secondary | ICD-10-CM

## 2015-05-06 DIAGNOSIS — B9789 Other viral agents as the cause of diseases classified elsewhere: Secondary | ICD-10-CM

## 2015-05-06 LAB — POCT URINALYSIS DIPSTICK
Blood, UA: NEGATIVE
GLUCOSE UA: NEGATIVE
KETONES UA: NEGATIVE
Nitrite, UA: NEGATIVE
PROTEIN UA: NEGATIVE

## 2015-05-06 MED ORDER — BENZONATATE 200 MG PO CAPS: 200.0000 mg | ORAL_CAPSULE | Freq: Three times a day (TID) | ORAL | Status: DC | PRN

## 2015-05-06 MED ORDER — PREDNISONE 20 MG PO TABS: 20.0000 mg | ORAL_TABLET | Freq: Every day | ORAL | Status: DC

## 2015-05-06 NOTE — Progress Notes (Signed)
[id number] [redacted]w[redacted]d Estimated Date of Delivery: 07/02/15  Blood pressure 110/58, pulse 88, weight 180 lb (81.647 kg), last menstrual period 09/19/2014.    WORK IN FOR COUGH/CONGESTION FOR 3-4 DAYS.  No fever.  Tried zyrtec and sudafed. BP weight and urine results all reviewed and noted. Fetus active.   HEENT:  Sl tender frontal sinuses.  Ears clear other than sl bulging membranes. No palpable nodes. Throat non erythemous/non inflammed Lungs:  CTAB.  Intermittently productive cough per pt.  Heart:  Regular rate/rhythm   Patient reports good fetal movement, denies any bleeding and no rupture of membranes symptoms or regular contractions. Discussed most likely viral illlness All questions were answered.  Orders Placed This Encounter  Procedures  . POCT urinalysis dipstick    Plan:  Rx Prednisone 20mg  qhs X7 and tessalon perls for cough.  Keep appt tomorrow for 17p.  Consider abx if not getting better by Friday.   Return for As scheduled.

## 2015-05-06 NOTE — Patient Instructions (Signed)
Acute Bronchitis Bronchitis is inflammation of the airways that extend from the windpipe into the lungs (bronchi). The inflammation often causes mucus to develop. This leads to a cough, which is the most common symptom of bronchitis.  In acute bronchitis, the condition usually develops suddenly and goes away over time, usually in a couple weeks. Smoking, allergies, and asthma can make bronchitis worse. Repeated episodes of bronchitis may cause further lung problems.  CAUSES Acute bronchitis is most often caused by the same virus that causes a cold. The virus can spread from person to person (contagious) through coughing, sneezing, and touching contaminated objects. SIGNS AND SYMPTOMS   Cough.   Fever.   Coughing up mucus.   Body aches.   Chest congestion.   Chills.   Shortness of breath.   Sore throat.  DIAGNOSIS  Acute bronchitis is usually diagnosed through a physical exam. Your health care provider will also ask you questions about your medical history. Tests, such as chest X-rays, are sometimes done to rule out other conditions.  TREATMENT  Acute bronchitis usually goes away in a couple weeks. Oftentimes, no medical treatment is necessary. Medicines are sometimes given for relief of fever or cough. Antibiotic medicines are usually not needed but may be prescribed in certain situations. In some cases, an inhaler may be recommended to help reduce shortness of breath and control the cough. A cool mist vaporizer may also be used to help thin bronchial secretions and make it easier to clear the chest.  HOME CARE INSTRUCTIONS  Get plenty of rest.   Drink enough fluids to keep your urine clear or pale yellow (unless you have a medical condition that requires fluid restriction). Increasing fluids may help thin your respiratory secretions (sputum) and reduce chest congestion, and it will prevent dehydration.   Take medicines only as directed by your health care provider.  If  you were prescribed an antibiotic medicine, finish it all even if you start to feel better.  Avoid smoking and secondhand smoke. Exposure to cigarette smoke or irritating chemicals will make bronchitis worse. If you are a smoker, consider using nicotine gum or skin patches to help control withdrawal symptoms. Quitting smoking will help your lungs heal faster.   Reduce the chances of another bout of acute bronchitis by washing your hands frequently, avoiding people with cold symptoms, and trying not to touch your hands to your mouth, nose, or eyes.   Keep all follow-up visits as directed by your health care provider.  SEEK MEDICAL CARE IF: Your symptoms do not improve after 1 week of treatment.  SEEK IMMEDIATE MEDICAL CARE IF:  You develop an increased fever or chills.   You have chest pain.   You have severe shortness of breath.  You have bloody sputum.   You develop dehydration.  You faint or repeatedly feel like you are going to pass out.  You develop repeated vomiting.  You develop a severe headache. MAKE SURE YOU:   Understand these instructions.  Will watch your condition.  Will get help right away if you are not doing well or get worse. Document Released: 09/16/2004 Document Revised: 12/24/2013 Document Reviewed: 01/30/2013 [facility] Patient Information 2015 [place], [place]. This information is not intended to replace advice given to you by your health care provider. Make sure you discuss any questions you have with your health care provider.   DELSYM COUGH SYRUP

## 2015-05-07 ENCOUNTER — Encounter: Payer: Self-pay | Admitting: Women's Health

## 2015-05-07 ENCOUNTER — Ambulatory Visit (INDEPENDENT_AMBULATORY_CARE_PROVIDER_SITE_OTHER): Payer: Medicaid Other | Admitting: Women's Health

## 2015-05-07 VITALS — BP 104/58 | HR 80 | Wt 182.0 lb

## 2015-05-07 DIAGNOSIS — Z1389 Encounter for screening for other disorder: Secondary | ICD-10-CM

## 2015-05-07 DIAGNOSIS — Z331 Pregnant state, incidental: Secondary | ICD-10-CM

## 2015-05-07 DIAGNOSIS — O09893 Supervision of other high risk pregnancies, third trimester: Secondary | ICD-10-CM

## 2015-05-07 DIAGNOSIS — Z3493 Encounter for supervision of normal pregnancy, unspecified, third trimester: Secondary | ICD-10-CM

## 2015-05-07 DIAGNOSIS — O09213 Supervision of pregnancy with history of pre-term labor, third trimester: Secondary | ICD-10-CM | POA: Diagnosis not present

## 2015-05-07 LAB — POCT URINALYSIS DIPSTICK
Blood, UA: NEGATIVE
Glucose, UA: NEGATIVE
Ketones, UA: NEGATIVE
LEUKOCYTES UA: NEGATIVE
NITRITE UA: NEGATIVE
Protein, UA: NEGATIVE

## 2015-05-07 MED ORDER — HYDROXYPROGESTERONE CAPROATE 250 MG/ML IM OIL
250.0000 mg | TOPICAL_OIL | Freq: Once | INTRAMUSCULAR | Status: AC
  Administered 2015-05-07: 250 mg via INTRAMUSCULAR

## 2015-05-07 NOTE — Patient Instructions (Signed)
Call the office [phone]) or go to [facility] if:  You begin to have strong, frequent contractions  Your water breaks.  Sometimes it is a big gush of fluid, sometimes it is just a trickle that keeps getting your panties wet or running down your legs  You have vaginal bleeding.  It is normal to have a small amount of spotting if your cervix was checked.   You don't feel your baby moving like normal.  If you don't, get you something to eat and drink and lay down and focus on feeling your baby move.  You should feel at least 10 movements in 2 hours.  If you don't, you should call the office or go to [facility].    Tdap Vaccine  It is recommended that you get the Tdap vaccine during the third trimester of EACH pregnancy to help protect your baby from getting pertussis (whooping cough)  27-36 weeks is the BEST time to do this so that you can pass the protection on to your baby. During pregnancy is better than after pregnancy, but if you are unable to get it during pregnancy it will be offered at the hospital.   You can get this vaccine at the health department or your family doctor  Everyone who will be around your baby should also be up-to-date on their vaccines. Adults (who are not pregnant) only need 1 dose of Tdap during adulthood.   You have a viral infection that will resolve on its own over time.  Symptoms typically last 3-7 days but can stretch out to 2-3 weeks.  Unfortunately, antibiotics are not helpful for viral infections.  Humidifier and saline nasal spray for nasal congestion  Regular robitussin, cough drops for cough  Warm salt water gargles for sore throat  Mucinex with lots of water to help you cough up the mucous in your chest if needed  Drink plenty of fluids and stay hydrated!  Wash your hands frequently.  Call if you are not improving by 7-10 days.      Third Trimester of Pregnancy The third trimester is from week 29 through week 42, months 7 through  9. The third trimester is a time when the fetus is growing rapidly. At the end of the ninth month, the fetus is about 20 inches in length and weighs 6-10 pounds.  BODY CHANGES Your body goes through many changes during pregnancy. The changes vary from woman to woman.  15. Your weight will continue to increase. You can expect to gain 25-35 pounds (11-16 kg) by the end of the pregnancy. 16. You may begin to get stretch marks on your hips, abdomen, and breasts. 89. You may urinate more often because the fetus is moving lower into your pelvis and pressing on your bladder. 18. You may develop or continue to have heartburn as a result of your pregnancy. 19. You may develop constipation because certain hormones are causing the muscles that push waste through your intestines to slow down. 20. You may develop hemorrhoids or swollen, bulging veins (varicose veins). 21. You may have pelvic pain because of the weight gain and pregnancy hormones relaxing your joints between the bones in your pelvis. Backaches may result from overexertion of the muscles supporting your posture. 22. You may have changes in your hair. These can include thickening of your hair, rapid growth, and changes in texture. Some women also have hair loss during or after pregnancy, or hair that feels dry or thin. Your hair will most  likely return to normal after your baby is born. 23. Your breasts will continue to grow and be tender. A yellow discharge may leak from your breasts called colostrum. 24. Your belly button may stick out. 25. You may feel short of breath because of your expanding uterus. 22. You may notice the fetus "dropping," or moving lower in your abdomen. 27. You may have a bloody mucus discharge. This usually occurs a few days to a week before labor begins. 28. Your cervix becomes thin and soft (effaced) near your due date. WHAT TO EXPECT AT YOUR PRENATAL EXAMS  You will have prenatal exams every 2 weeks until week 36. Then,  you will have weekly prenatal exams. During a routine prenatal visit:  You will be weighed to make sure you and the fetus are growing normally.  Your blood pressure is taken.  Your abdomen will be measured to track your baby's growth.  The fetal heartbeat will be listened to.  Any test results from the previous visit will be discussed.  You may have a cervical check near your due date to see if you have effaced. At around 36 weeks, your caregiver will check your cervix. At the same time, your caregiver will also perform a test on the secretions of the vaginal tissue. This test is to determine if a type of bacteria, Group B streptococcus, is present. Your caregiver will explain this further. Your caregiver may ask you:  What your birth plan is.  How you are feeling.  If you are feeling the baby move.  If you have had any abnormal symptoms, such as leaking fluid, bleeding, severe headaches, or abdominal cramping.  If you have any questions. Other tests or screenings that may be performed during your third trimester include:  Blood tests that check for low iron levels (anemia).  Fetal testing to check the health, activity level, and growth of the fetus. Testing is done if you have certain medical conditions or if there are problems during the pregnancy. FALSE LABOR You may feel small, irregular contractions that eventually go away. These are called Braxton Hicks contractions, or false labor. Contractions may last for hours, days, or even weeks before true labor sets in. If contractions come at regular intervals, intensify, or become painful, it is best to be seen by your caregiver.  SIGNS OF LABOR   Menstrual-like cramps.  Contractions that are 5 minutes apart or less.  Contractions that start on the top of the uterus and spread down to the lower abdomen and back.  A sense of increased pelvic pressure or back pain.  A watery or bloody mucus discharge that comes from the  vagina. If you have any of these signs before the 37th week of pregnancy, call your caregiver right away. You need to go to the hospital to get checked immediately. HOME CARE INSTRUCTIONS   Avoid all smoking, herbs, alcohol, and unprescribed drugs. These chemicals affect the formation and growth of the baby.  Follow your caregiver's instructions regarding medicine use. There are medicines that are either safe or unsafe to take during pregnancy.  Exercise only as directed by your caregiver. Experiencing uterine cramps is a good sign to stop exercising.  Continue to eat regular, healthy meals.  Wear a good support bra for breast tenderness.  Do not use hot tubs, steam rooms, or saunas.  Wear your seat belt at all times when driving.  Avoid raw meat, uncooked cheese, cat litter boxes, and soil used by cats. These carry  germs that can cause birth defects in the baby.  Take your prenatal vitamins.  Try taking a stool softener (if your caregiver approves) if you develop constipation. Eat more high-fiber foods, such as fresh vegetables or fruit and whole grains. Drink plenty of fluids to keep your urine clear or pale yellow.  Take warm sitz baths to soothe any pain or discomfort caused by hemorrhoids. Use hemorrhoid cream if your caregiver approves.  If you develop varicose veins, wear support hose. Elevate your feet for 15 minutes, 3-4 times a day. Limit salt in your diet.  Avoid heavy lifting, wear low heal shoes, and practice good posture.  Rest a lot with your legs elevated if you have leg cramps or low back pain.  Visit your dentist if you have not gone during your pregnancy. Use a soft toothbrush to brush your teeth and be gentle when you floss.  A sexual relationship may be continued unless your caregiver directs you otherwise.  Do not travel far distances unless it is absolutely necessary and only with the approval of your caregiver.  Take prenatal classes to understand,  practice, and ask questions about the labor and delivery.  Make a trial run to the hospital.  Pack your hospital bag.  Prepare the baby's nursery.  Continue to go to all your prenatal visits as directed by your caregiver. SEEK MEDICAL CARE IF:  You are unsure if you are in labor or if your water has broken.  You have dizziness.  You have mild pelvic cramps, pelvic pressure, or nagging pain in your abdominal area.  You have persistent nausea, vomiting, or diarrhea.  You have a bad smelling vaginal discharge.  You have pain with urination. SEEK IMMEDIATE MEDICAL CARE IF:   You have a fever.  You are leaking fluid from your vagina.  You have spotting or bleeding from your vagina.  You have severe abdominal cramping or pain.  You have rapid weight loss or gain.  You have shortness of breath with chest pain.  You notice sudden or extreme swelling of your face, hands, ankles, feet, or legs.  You have not felt your baby move in over an hour.  You have severe headaches that do not go away with medicine.  You have vision changes. Document Released: 08/03/2001 Document Revised: 08/14/2013 Document Reviewed: 10/10/2012 [facility] Patient Information 2015 [place], [place]. This information is not intended to replace advice given to you by your health care provider. Make sure you discuss any questions you have with your health care provider.

## 2015-05-07 NOTE — Progress Notes (Signed)
Low-risk OB appointment [id number] [redacted]w[redacted]d Estimated Date of Delivery: 07/02/15 BP 104/58 mmHg  Pulse 80  Wt 182 lb (82.555 kg)  LMP 09/19/2014 (Exact Date)  BP, weight, and urine reviewed.  Refer to obstetrical flow sheet for FH & FHR.  Reports good fm.  Denies regular uc's, lof, vb, or uti s/s. Saw Manus Gunning yesterday as work-in for cough/congestion- taking prednisone & tessalon pearls as directed. To let us know if not improving/worsening. Gave other relief measures to try for sx.  Reviewed ptl s/s, fkc, normal pn2 labs. Plan:  Continue routine obstetrical care  F/U in 2wks for OB appointment, weekly for [facility]

## 2015-05-14 ENCOUNTER — Encounter (HOSPITAL_COMMUNITY): Payer: Self-pay | Admitting: *Deleted

## 2015-05-14 ENCOUNTER — Inpatient Hospital Stay (HOSPITAL_COMMUNITY)
Admission: AD | Admit: 2015-05-14 | Discharge: 2015-05-14 | Disposition: A | Discharge status: Home / Self Care | Payer: Medicaid Other | Source: Ambulatory Visit | Attending: Family Medicine | Admitting: Family Medicine

## 2015-05-14 ENCOUNTER — Inpatient Hospital Stay (HOSPITAL_COMMUNITY): Payer: Medicaid Other

## 2015-05-14 ENCOUNTER — Ambulatory Visit (INDEPENDENT_AMBULATORY_CARE_PROVIDER_SITE_OTHER): Payer: Medicaid Other | Admitting: *Deleted

## 2015-05-14 ENCOUNTER — Ambulatory Visit (INDEPENDENT_AMBULATORY_CARE_PROVIDER_SITE_OTHER): Payer: Medicaid Other | Admitting: Women's Health

## 2015-05-14 ENCOUNTER — Encounter: Payer: Self-pay | Admitting: *Deleted

## 2015-05-14 VITALS — BP 120/78 | HR 88 | Ht 69.0 in | Wt 183.0 lb

## 2015-05-14 DIAGNOSIS — O26893 Other specified pregnancy related conditions, third trimester: Secondary | ICD-10-CM | POA: Diagnosis present

## 2015-05-14 DIAGNOSIS — Z3A33 33 weeks gestation of pregnancy: Secondary | ICD-10-CM | POA: Diagnosis not present

## 2015-05-14 DIAGNOSIS — O4290 Premature rupture of membranes, unspecified as to length of time between rupture and onset of labor, unspecified weeks of gestation: Secondary | ICD-10-CM

## 2015-05-14 DIAGNOSIS — O283 Abnormal ultrasonic finding on antenatal screening of mother: Secondary | ICD-10-CM

## 2015-05-14 DIAGNOSIS — M549 Dorsalgia, unspecified: Secondary | ICD-10-CM | POA: Insufficient documentation

## 2015-05-14 DIAGNOSIS — O09213 Supervision of pregnancy with history of pre-term labor, third trimester: Secondary | ICD-10-CM

## 2015-05-14 DIAGNOSIS — N898 Other specified noninflammatory disorders of vagina: Secondary | ICD-10-CM | POA: Insufficient documentation

## 2015-05-14 DIAGNOSIS — Z3493 Encounter for supervision of normal pregnancy, unspecified, third trimester: Secondary | ICD-10-CM

## 2015-05-14 DIAGNOSIS — O4703 False labor before 37 completed weeks of gestation, third trimester: Secondary | ICD-10-CM

## 2015-05-14 DIAGNOSIS — Z1389 Encounter for screening for other disorder: Secondary | ICD-10-CM

## 2015-05-14 DIAGNOSIS — Z331 Pregnant state, incidental: Secondary | ICD-10-CM

## 2015-05-14 DIAGNOSIS — O09893 Supervision of other high risk pregnancies, third trimester: Secondary | ICD-10-CM

## 2015-05-14 DIAGNOSIS — Z8751 Personal history of pre-term labor: Secondary | ICD-10-CM

## 2015-05-14 LAB — URINALYSIS, ROUTINE W REFLEX MICROSCOPIC
Bilirubin Urine: NEGATIVE
GLUCOSE, UA: NEGATIVE mg/dL
HGB URINE DIPSTICK: NEGATIVE
Ketones, ur: NEGATIVE mg/dL
Leukocytes, UA: NEGATIVE
Nitrite: NEGATIVE
Protein, ur: NEGATIVE mg/dL
SPECIFIC GRAVITY, URINE: 1.01 (ref 1.005–1.030)
UROBILINOGEN UA: 0.2 mg/dL (ref 0.0–1.0)
pH: 6 (ref 5.0–8.0)

## 2015-05-14 LAB — POCT URINALYSIS DIPSTICK
GLUCOSE UA: NEGATIVE
Ketones, UA: NEGATIVE
Leukocytes, UA: NEGATIVE
Nitrite, UA: NEGATIVE
Protein, UA: NEGATIVE
RBC UA: NEGATIVE

## 2015-05-14 LAB — AMNISURE RUPTURE OF MEMBRANE (ROM) NOT AT ARMC: Amnisure ROM: NEGATIVE

## 2015-05-14 MED ORDER — CYCLOBENZAPRINE HCL 5 MG PO TABS: 5.0000 mg | ORAL_TABLET | Freq: Three times a day (TID) | ORAL | Status: DC | PRN

## 2015-05-14 MED ORDER — HYDROXYPROGESTERONE CAPROATE 250 MG/ML IM OIL
250.0000 mg | TOPICAL_OIL | Freq: Once | INTRAMUSCULAR | Status: AC
  Administered 2015-05-14: 250 mg via INTRAMUSCULAR

## 2015-05-14 NOTE — Progress Notes (Signed)
Work-in Low-risk OB appointment, was here for [facility] shot [id number] [redacted]w[redacted]d Estimated Date of Delivery: 07/02/15 LMP 09/19/2014 (Exact Date)  BP, weight, and urine reviewed.  Refer to obstetrical flow sheet for FH & FHR.  Reports good fm.  Denies vb, or uti s/s. Lots of cramping, back pain, pressure. LOF for past week intermittently, more when coughing, but happens when sitting and then standing as well- enough to change underwear. Some abdominal tenderness over past week. Denies fever, some chills.  Abd: mild tenderness upper & lower abdomen SSE: cx visually closed, +pooling, fFN collected- fern was neg, deferred SVE incase ruptured  Recommended to go to [facility] for further eval/amnisure. Called resident and notified them to be expecting her.  If d/c'd, keep next appt next week as scheduled.  Makena today

## 2015-05-14 NOTE — MAU Note (Signed)
Pt was seen in [facility]'s office today for her 17 P injection.  Pt states she has been having abd tightening and back pain x 3 days.  Pt states she also feels like she is leaking fluid intermittently and wear panty liner daily for the wetness.  Pt examined by K. Booker, CNM today and sent to MAU for testing of vaginal fluid.  Denies any vaginal bleeding, increased vaginal discharge.  Good fetal movement.

## 2015-05-14 NOTE — Addendum Note (Signed)
Addended by: Wells Guiles R on: 05/14/2015 11:31 AM   Modules accepted: Orders

## 2015-05-14 NOTE — MAU Provider Note (Signed)
History     CSN: [id number]  Arrival date and time: 05/14/15 1215   First Provider Initiated Contact with Patient 05/14/15 1257      Chief Complaint  Patient presents with  . Labor Eval  . Rupture of Membranes   HPI  Patient is 29 y.o. [id number] [redacted]w[redacted]d here with complaints of leakage of fluid.  [name] was sent to the MAU from [facility] today. She reported leakage of fluid over the past week, and was noted to have pooling but negative ferning. She was sent here for Amnisure to r/u ROM.  +FM, denies VB, contractions, vaginal discharge. Patient also reports back pain and abdominal cramps for the past week. She says her back pain is located mostly in her lower back bilaterally. She describes the pain as dull. She experiences intermittent lower abdominal cramps. She has not tried anything to alleviate her symptoms at home. Sometimes changing position helps with the pain.    Past Medical History  Diagnosis Date  . Preterm labor     Past Surgical History  Procedure Laterality Date  . Tonsillectomy      Family History  Problem Relation Age of Onset  . Diabetes Maternal Aunt   . Hypertension Maternal Aunt   . Stroke Maternal Aunt   . Diabetes Maternal Uncle   . Hypertension Maternal Uncle   . Hypertension Paternal Aunt   . Hypertension Paternal Uncle   . Cancer Maternal Grandmother     breast  . Diabetes Maternal Grandmother   . Hypertension Maternal Grandmother   . Heart disease Maternal Grandfather   . Hypertension Maternal Grandfather   . Hypertension Paternal Grandmother   . Hypertension Paternal Grandfather   . Premature birth Daughter   . Asthma Daughter     Social History  Substance Use Topics  . Smoking status: Never Smoker   . Smokeless tobacco: Never Used  . Alcohol Use: No    Allergies: No Known Allergies  No prescriptions prior to admission    Review of Systems  Gastrointestinal: Negative for vomiting and abdominal pain.  Genitourinary:  Negative for dysuria.  Musculoskeletal: Positive for back pain.   Physical Exam   Blood pressure 98/54, pulse 78, temperature 98.3 F (36.8 C), temperature source Oral, resp. rate 18, last menstrual period 09/19/2014, SpO2 97 %.  Physical Exam  Nursing note and vitals reviewed. Constitutional: She is oriented to person, place, and time. She appears well-developed and well-nourished. No distress.  HENT:  Head: Normocephalic and atraumatic.  Cardiovascular: Normal rate.   Respiratory: Effort normal. No respiratory distress.  GI: Soft. She exhibits no distension.  Gravid  Genitourinary: No vaginal discharge found.  Pooling of white fluid noted on speculum exam  Musculoskeletal: She exhibits no edema or tenderness.  Neurological: She is alert and oriented to person, place, and time. No cranial nerve deficit.  Skin: Skin is warm and dry.  Psychiatric: She has a normal mood and affect. Her behavior is normal.    MAU Course  Procedures None  MDM Ferning (at [facility]) - negative Amnisure - negative [name] - AFI 11.39, largest pocket 4.73  Assessment and Plan  A: Patient is 29 y.o. [id number] [redacted]w[redacted]d reporting leakage of fluid. Amnisure negative, and, per FT, ferning negative. Pooling noted at both FT and here. Given adequate fluid (AFI 11.39) on [name], will not admit today.   P: Discharge home - Reviewed findings and my conclusion - Handout given - Flexeril for back pain - Follow-up with OB provider  Adin Hector, MD PGY-1 [facility] 05/14/2015, 4:38 PM   OB fellow attestation: I have seen and examined this patient; I agree with above documentation in the resident's note.   Linda Reid is a 29 y.o. [id number] reporting ROM wit +pooling at FT.  +FM, denies, VB, contractions, vaginal discharge.  PE: BP 98/54 mmHg  Pulse 78  Temp(Src) 98.3 F (36.8 C) (Oral)  Resp 18  SpO2 97%  LMP 09/19/2014 (Exact Date) Gen: calm comfortable, NAD Resp: normal  effort, no distress Abd: gravid  ROS, labs, PMH reviewed NST reactive   Plan: - Reviewed all testing and evidence points to the fact the the patient has not ROM.  - fetal kick counts reinforced, preterm labor precautions - continue routine follow up in OB clinic  Caren Macadam, MD 11:08 PM

## 2015-05-14 NOTE — Progress Notes (Signed)
Patient ID: Linda Reid, female   DOB: 02/15/1986, 29 y.o.   MRN: [id number] Pt here today for her weekly 17P injection. Pt states that she is having a lot of cramping in her lower stomach. Pt states that she has noticed the cramping pretty frequently, sometimes the cramping will be every few minutes and sometimes the cramping is further apart. Pt states that she has also had clear watery discharge since the cramping started. Pt denies any bleeding or gush of fluid. Pt states that she has good fetal movement.

## 2015-05-14 NOTE — Discharge Instructions (Signed)
Premature Rupture and Preterm Premature Rupture of Membranes °A sac made up of membranes surrounds your baby in the womb (uterus). When this sac breaks before contractions or labor starts, it is called premature rupture of membranes (PROM). Rupture of membranes is also known as your water breaking. If this happens before 37 weeks, it is called preterm premature rupture of membranes (PPROM). PPROM is serious. It needs medical care right away. °CAUSES  °PROM may be caused by the membranes getting weak. This happens at the end of pregnancy. PPROM is often due to an infection, but can be caused by a number of other things.  °SIGNS OF PROM OR PPROM °· A sudden gush of fluid from the vagina. °· A slow leak of fluid from the vagina. °· Your underwear stay wet. °WHAT TO DO IF YOU THINK YOUR WATER BROKE °Call your doctor right away. You will need to go to the hospital to get checked right away. °WHAT HAPPENS IF YOU ARE TOLD YOU HAVE PROM OR PPROM? °You will have tests done at the hospital. If you have PROM, you may be given medicine to start labor (induced). This may happen if you are not having contractions within 24 hours of your water breaking. If you have PPROM and are not having contractions, you may be given medicine to start labor. It will depend on how far along you are in your pregnancy. °If you have PPROM, you: °· And your baby will be watched closely for signs of infection or other problems. °· May be given an antibiotic medicine. This can stop an infection from starting. °· May be given a steroid medicine. This can help the lungs to develop faster. °· May be given a medicine to stop early labor (preterm labor). °· May be told to stay in bed except to use the restroom (bed rest). °· May be given medicine to start labor. This can happen if there are problems with you or the baby. °Your treatment will depend on many factors. °Document Released: 11/05/2008 Document Revised: 04/11/2013 Document Reviewed:  11/28/2012 °[employer]® Patient Information ©2015 [employer]. This information is not intended to replace advice given to you by your health care provider. Make sure you discuss any questions you have with your health care provider. ° °

## 2015-05-15 LAB — FETAL FIBRONECTIN: FETAL FIBRONECTIN: POSITIVE — AB

## 2015-05-21 ENCOUNTER — Ambulatory Visit (INDEPENDENT_AMBULATORY_CARE_PROVIDER_SITE_OTHER): Payer: Medicaid Other | Admitting: Advanced Practice Midwife

## 2015-05-21 VITALS — BP 110/70 | HR 80 | Wt 182.0 lb

## 2015-05-21 DIAGNOSIS — Z23 Encounter for immunization: Secondary | ICD-10-CM

## 2015-05-21 DIAGNOSIS — Z1389 Encounter for screening for other disorder: Secondary | ICD-10-CM

## 2015-05-21 DIAGNOSIS — Z3A34 34 weeks gestation of pregnancy: Secondary | ICD-10-CM

## 2015-05-21 DIAGNOSIS — Z331 Pregnant state, incidental: Secondary | ICD-10-CM

## 2015-05-21 DIAGNOSIS — O09213 Supervision of pregnancy with history of pre-term labor, third trimester: Secondary | ICD-10-CM

## 2015-05-21 DIAGNOSIS — Z8751 Personal history of pre-term labor: Secondary | ICD-10-CM

## 2015-05-21 DIAGNOSIS — Z3493 Encounter for supervision of normal pregnancy, unspecified, third trimester: Secondary | ICD-10-CM

## 2015-05-21 LAB — POCT URINALYSIS DIPSTICK
GLUCOSE UA: NEGATIVE
Ketones, UA: NEGATIVE
Nitrite, UA: NEGATIVE
Protein, UA: NEGATIVE

## 2015-05-21 MED ORDER — HYDROXYPROGESTERONE CAPROATE 250 MG/ML IM OIL
250.0000 mg | TOPICAL_OIL | Freq: Once | INTRAMUSCULAR | Status: AC
  Administered 2015-05-21: 250 mg via INTRAMUSCULAR

## 2015-05-21 NOTE — Progress Notes (Signed)
[id number] [redacted]w[redacted]d Estimated Date of Delivery: 07/02/15  Blood pressure 110/70, pulse 80, weight 182 lb (82.555 kg), last menstrual period 09/19/2014.   BP weight and urine results all reviewed and noted.  Please refer to the obstetrical flow sheet for the fundal height and fetal heart rate documentation:   Patient reports good fetal movement, denies any bleeding, contracgtions.  Has LBP, given rx Flexeril in MAU last week.  All questions were answered. Fiance just got promotion, will have to move to [place], maybe 11/1.  Will let us know what she's going to do.   Orders Placed This Encounter  Procedures  . Flu Vaccine QUAD 36+ mos IM  . POCT urinalysis dipstick    Plan:  Continued routine obstetrical care, flu shot today  Return in about 1 week (around 05/28/2015) for LROB, 17P Weekly.

## 2015-05-28 ENCOUNTER — Encounter: Payer: Self-pay | Admitting: Obstetrics and Gynecology

## 2015-05-28 ENCOUNTER — Encounter: Payer: Self-pay | Admitting: *Deleted

## 2015-05-28 ENCOUNTER — Ambulatory Visit: Payer: Medicaid Other

## 2015-05-29 ENCOUNTER — Telehealth: Payer: Self-pay | Admitting: Women's Health

## 2015-05-29 ENCOUNTER — Telehealth: Payer: Self-pay | Admitting: Obstetrics & Gynecology

## 2015-05-29 MED ORDER — HYDROCODONE-ACETAMINOPHEN 5-325 MG PO TABS: 1.0000 | ORAL_TABLET | Freq: Four times a day (QID) | ORAL | Status: DC | PRN

## 2015-05-29 NOTE — Telephone Encounter (Signed)
Pt c/o a lot of pain lower extremities, lower abdominal and back pain, +FM, no vaginal bleeding. Pt states having to have someone help dress her everyday. Tylenol not helping.

## 2015-05-31 NOTE — Telephone Encounter (Signed)
Patient concerns addressed 10/6 by Knute Neu

## 2015-06-03 ENCOUNTER — Encounter: Payer: Self-pay | Admitting: Obstetrics & Gynecology

## 2015-06-03 ENCOUNTER — Ambulatory Visit (INDEPENDENT_AMBULATORY_CARE_PROVIDER_SITE_OTHER): Payer: Medicaid Other | Admitting: Obstetrics & Gynecology

## 2015-06-03 VITALS — BP 100/70 | HR 88 | Wt 184.0 lb

## 2015-06-03 DIAGNOSIS — Z3493 Encounter for supervision of normal pregnancy, unspecified, third trimester: Secondary | ICD-10-CM

## 2015-06-03 DIAGNOSIS — Z1389 Encounter for screening for other disorder: Secondary | ICD-10-CM

## 2015-06-03 DIAGNOSIS — Z331 Pregnant state, incidental: Secondary | ICD-10-CM

## 2015-06-03 LAB — POCT URINALYSIS DIPSTICK
Blood, UA: NEGATIVE
Glucose, UA: NEGATIVE
Leukocytes, UA: NEGATIVE
Nitrite, UA: NEGATIVE
PROTEIN UA: NEGATIVE

## 2015-06-03 NOTE — Progress Notes (Signed)
[id number] [redacted]w[redacted]d Estimated Date of Delivery: 07/02/15  Blood pressure 100/70, pulse 88, weight 184 lb (83.462 kg), last menstrual period 09/19/2014.   BP weight and urine results all reviewed and noted.  Please refer to the obstetrical flow sheet for the fundal height and fetal heart rate documentation:  Patient reports good fetal movement, denies any bleeding and no rupture of membranes symptoms or regular contractions. Patient is without complaints. All questions were answered.  Orders Placed This Encounter  Procedures  . POCT urinalysis dipstick    Plan:  Continued routine obstetrical care, pt carrying low  No Follow-up on file.

## 2015-06-08 ENCOUNTER — Inpatient Hospital Stay (HOSPITAL_COMMUNITY)
Admission: AD | Admit: 2015-06-08 | Discharge: 2015-06-08 | Disposition: A | Discharge status: Home / Self Care | Payer: Medicaid Other | Source: Ambulatory Visit | Attending: Obstetrics & Gynecology | Admitting: Obstetrics & Gynecology

## 2015-06-08 ENCOUNTER — Encounter (HOSPITAL_COMMUNITY): Payer: Self-pay | Admitting: *Deleted

## 2015-06-08 DIAGNOSIS — O4703 False labor before 37 completed weeks of gestation, third trimester: Secondary | ICD-10-CM | POA: Diagnosis not present

## 2015-06-08 DIAGNOSIS — O283 Abnormal ultrasonic finding on antenatal screening of mother: Secondary | ICD-10-CM

## 2015-06-08 DIAGNOSIS — R938 Abnormal findings on diagnostic imaging of other specified body structures: Secondary | ICD-10-CM

## 2015-06-08 DIAGNOSIS — Z3A36 36 weeks gestation of pregnancy: Secondary | ICD-10-CM | POA: Insufficient documentation

## 2015-06-08 DIAGNOSIS — Z3493 Encounter for supervision of normal pregnancy, unspecified, third trimester: Secondary | ICD-10-CM

## 2015-06-08 DIAGNOSIS — M549 Dorsalgia, unspecified: Secondary | ICD-10-CM | POA: Diagnosis present

## 2015-06-08 LAB — URINALYSIS, ROUTINE W REFLEX MICROSCOPIC
BILIRUBIN URINE: NEGATIVE
Glucose, UA: NEGATIVE mg/dL
HGB URINE DIPSTICK: NEGATIVE
KETONES UR: NEGATIVE mg/dL
Leukocytes, UA: NEGATIVE
NITRITE: NEGATIVE
PH: 6 (ref 5.0–8.0)
Protein, ur: NEGATIVE mg/dL
Specific Gravity, Urine: 1.02 (ref 1.005–1.030)
UROBILINOGEN UA: 0.2 mg/dL (ref 0.0–1.0)

## 2015-06-08 NOTE — MAU Note (Signed)
C/o abdominal cramping and lower back pain for past 3 days; also c/o vaginal pressure for past 3 days; ?SROM;

## 2015-06-08 NOTE — MAU Provider Note (Signed)
History   [id number] in with c/o back pain and pelvic pressure for 3 days, denies ROM or vag bleeding.  CSN: [id number]  Arrival date and time: 06/08/15 0854   None     Chief Complaint  Patient presents with  . Back Pain  . Abdominal Pain  . Vaginal Pain   HPI  OB History    Gravida Para Term Preterm AB TAB SAB Ectopic Multiple Living   6 3 2 1 2  2   3       Past Medical History  Diagnosis Date  . Preterm labor     Past Surgical History  Procedure Laterality Date  . Tonsillectomy      Family History  Problem Relation Age of Onset  . Diabetes Maternal Aunt   . Hypertension Maternal Aunt   . Stroke Maternal Aunt   . Diabetes Maternal Uncle   . Hypertension Maternal Uncle   . Hypertension Paternal Aunt   . Hypertension Paternal Uncle   . Cancer Maternal Grandmother     breast  . Diabetes Maternal Grandmother   . Hypertension Maternal Grandmother   . Heart disease Maternal Grandfather   . Hypertension Maternal Grandfather   . Hypertension Paternal Grandmother   . Hypertension Paternal Grandfather   . Premature birth Daughter   . Asthma Daughter     Social History  Substance Use Topics  . Smoking status: Never Smoker   . Smokeless tobacco: Never Used  . Alcohol Use: No    Allergies: No Known Allergies  Prescriptions prior to admission  Medication Sig Dispense Refill Last Dose  . acetaminophen (TYLENOL) 325 MG tablet Take 650 mg by mouth every 6 (six) hours as needed for mild pain.    prn  . cetirizine (ZYRTEC) 10 MG tablet Take 10 mg by mouth daily as needed for allergies.    06/07/2015 at Unknown time  . Prenatal Vit-Fe Fumarate-FA (MULTIVITAMIN-PRENATAL) 27-0.8 MG TABS tablet Take 1 tablet by mouth daily at 12 noon.   06/07/2015 at Unknown time  . benzonatate (TESSALON) 200 MG capsule Take 1 capsule (200 mg total) by mouth 3 (three) times daily as needed for cough. (Patient not taking: Reported on 05/21/2015) 20 capsule 0 Not Taking at Unknown time  .  cyclobenzaprine (FLEXERIL) 5 MG tablet Take 1 tablet (5 mg total) by mouth 3 (three) times daily as needed for muscle spasms. (Patient not taking: Reported on 06/03/2015) 30 tablet 0 Not Taking at Unknown time  . HYDROcodone-acetaminophen (NORCO/VICODIN) 5-325 MG tablet Take 1 tablet by mouth every 6 (six) hours as needed. (Patient not taking: Reported on 06/08/2015) 15 tablet 0 Not Taking at Unknown time  . predniSONE (DELTASONE) 20 MG tablet Take 1 tablet (20 mg total) by mouth at bedtime. (Patient not taking: Reported on 05/21/2015) 7 tablet 0 Not Taking at Unknown time    Review of Systems  Constitutional: Negative.   HENT: Negative.   Eyes: Negative.   Respiratory: Negative.   Cardiovascular: Negative.   Gastrointestinal: Positive for abdominal pain.  Genitourinary: Negative.   Musculoskeletal: Positive for back pain.  Skin: Negative.   Neurological: Negative.   Endo/Heme/Allergies: Negative.   Psychiatric/Behavioral: Negative.    Physical Exam   Blood pressure 108/65, pulse 102, temperature 98.8 F (37.1 C), temperature source Oral, resp. rate 16, last menstrual period 09/19/2014.  Physical Exam  Constitutional: She is oriented to person, place, and time. She appears well-developed and well-nourished.  HENT:  Head: Normocephalic.  Eyes: Pupils are equal, round,  and reactive to light.  Neck: Normal range of motion.  Cardiovascular: Normal rate, regular rhythm, normal heart sounds and intact distal pulses.   Respiratory: Effort normal and breath sounds normal.  GI: Soft. Bowel sounds are normal.  Genitourinary: Vagina normal and uterus normal.  Musculoskeletal: Normal range of motion.  Neurological: She is alert and oriented to person, place, and time. She has normal reflexes.  Skin: Skin is warm and dry.  Psychiatric: She has a normal mood and affect. Her behavior is normal. Judgment and thought content normal.    MAU Course  Procedures  MDM False labor  Assessment  and Plan  FHR  Pattern reassurring, SVE 3/70/-2, d/c home  Linda Reid [name] 06/08/2015, 9:54 AM

## 2015-06-08 NOTE — Discharge Instructions (Signed)
Braxton Hicks Contractions °Contractions of the uterus can occur throughout pregnancy. Contractions are not always a sign that you are in labor.  °WHAT ARE BRAXTON HICKS CONTRACTIONS?  °Contractions that occur before labor are called Braxton Hicks contractions, or false labor. Toward the end of pregnancy (32-34 weeks), these contractions can develop more often and may become more forceful. This is not true labor because these contractions do not result in opening (dilatation) and thinning of the cervix. They are sometimes difficult to tell apart from true labor because these contractions can be forceful and people have different pain tolerances. You should not feel embarrassed if you go to the hospital with false labor. Sometimes, the only way to tell if you are in true labor is for your health care provider to look for changes in the cervix. °If there are no prenatal problems or other health problems associated with the pregnancy, it is completely safe to be sent home with false labor and await the onset of true labor. °HOW CAN YOU TELL THE DIFFERENCE BETWEEN TRUE AND FALSE LABOR? °False Labor °· The contractions of false labor are usually shorter and not as hard as those of true labor.   °· The contractions are usually irregular.   °· The contractions are often felt in the front of the lower abdomen and in the groin.   °· The contractions may go away when you walk around or change positions while lying down.   °· The contractions get weaker and are shorter lasting as time goes on.   °· The contractions do not usually become progressively stronger, regular, and closer together as with true labor.   °True Labor °· Contractions in true labor last 30-70 seconds, become very regular, usually become more intense, and increase in frequency.   °· The contractions do not go away with walking.   °· The discomfort is usually felt in the top of the uterus and spreads to the lower abdomen and low back.   °· True labor can be  determined by your health care provider with an exam. This will show that the cervix is dilating and getting thinner.   °WHAT TO REMEMBER °· Keep up with your usual exercises and follow other instructions given by your health care provider.   °· Take medicines as directed by your health care provider.   °· Keep your regular prenatal appointments.   °· Eat and drink lightly if you think you are going into labor.   °· If Braxton Hicks contractions are making you uncomfortable:   °¨ Change your position from lying down or resting to walking, or from walking to resting.   °¨ Sit and rest in a tub of warm water.   °¨ Drink 2-3 glasses of water. Dehydration may cause these contractions.   °¨ Do slow and deep breathing several times an hour.   °WHEN SHOULD I SEEK IMMEDIATE MEDICAL CARE? °Seek immediate medical care if: °· Your contractions become stronger, more regular, and closer together.   °· You have fluid leaking or gushing from your vagina.   °· You have a fever.   °· You pass blood-tinged mucus.   °· You have vaginal bleeding.   °· You have continuous abdominal pain.   °· You have low back pain that you never had before.   °· You feel your baby's head pushing down and causing pelvic pressure.   °· Your baby is not moving as much as it used to.   °  °This information is not intended to replace advice given to you by your health care provider. Make sure you discuss any questions you have with your health care   provider. °  °Document Released: 08/09/2005 Document Revised: 08/14/2013 Document Reviewed: 05/21/2013 °Elsevier Interactive Patient Education ©2016 [employer] ° °

## 2015-06-10 ENCOUNTER — Ambulatory Visit (INDEPENDENT_AMBULATORY_CARE_PROVIDER_SITE_OTHER): Payer: Medicaid Other | Admitting: Obstetrics & Gynecology

## 2015-06-10 ENCOUNTER — Encounter: Payer: Self-pay | Admitting: Obstetrics & Gynecology

## 2015-06-10 VITALS — BP 100/70 | HR 88 | Wt 188.4 lb

## 2015-06-10 DIAGNOSIS — Z1389 Encounter for screening for other disorder: Secondary | ICD-10-CM

## 2015-06-10 DIAGNOSIS — Z3685 Encounter for antenatal screening for Streptococcus B: Secondary | ICD-10-CM

## 2015-06-10 DIAGNOSIS — Z3493 Encounter for supervision of normal pregnancy, unspecified, third trimester: Secondary | ICD-10-CM

## 2015-06-10 DIAGNOSIS — O09893 Supervision of other high risk pregnancies, third trimester: Secondary | ICD-10-CM

## 2015-06-10 DIAGNOSIS — Z331 Pregnant state, incidental: Secondary | ICD-10-CM

## 2015-06-10 DIAGNOSIS — Z1159 Encounter for screening for other viral diseases: Secondary | ICD-10-CM

## 2015-06-10 DIAGNOSIS — O09213 Supervision of pregnancy with history of pre-term labor, third trimester: Secondary | ICD-10-CM

## 2015-06-10 DIAGNOSIS — Z118 Encounter for screening for other infectious and parasitic diseases: Secondary | ICD-10-CM

## 2015-06-10 LAB — POCT URINALYSIS DIPSTICK
Blood, UA: NEGATIVE
GLUCOSE UA: NEGATIVE
KETONES UA: NEGATIVE
Leukocytes, UA: NEGATIVE
Nitrite, UA: NEGATIVE
Protein, UA: NEGATIVE

## 2015-06-10 LAB — OB RESULTS CONSOLE GBS: GBS: NEGATIVE

## 2015-06-10 NOTE — Progress Notes (Signed)
[id number] [redacted]w[redacted]d Estimated Date of Delivery: 07/02/15  Blood pressure 100/70, pulse 88, weight 188 lb 6.4 oz (85.458 kg), last menstrual period 09/19/2014.   BP weight and urine results all reviewed and noted.  Please refer to the obstetrical flow sheet for the fundal height and fetal heart rate documentation:  Patient reports good fetal movement, denies any bleeding and no rupture of membranes symptoms or regular contractions. Patient is without complaints. All questions were answered.  Orders Placed This Encounter  Procedures  . Culture, beta strep (group b only)  . GC/Chlamydia Probe Amp  . POCT urinalysis dipstick    Plan:  Continued routine obstetrical care,   Return in about 1 week (around 06/17/2015) for LROB.

## 2015-06-11 LAB — GC/CHLAMYDIA PROBE AMP
Chlamydia trachomatis, NAA: NEGATIVE
NEISSERIA GONORRHOEAE BY PCR: NEGATIVE

## 2015-06-14 ENCOUNTER — Encounter (HOSPITAL_COMMUNITY): Payer: Self-pay

## 2015-06-14 ENCOUNTER — Inpatient Hospital Stay (HOSPITAL_COMMUNITY)
Admission: AD | Admit: 2015-06-14 | Discharge: 2015-06-14 | Disposition: A | Discharge status: Home / Self Care | Payer: Medicaid Other | Source: Ambulatory Visit | Attending: Family Medicine | Admitting: Family Medicine

## 2015-06-14 DIAGNOSIS — Z3A37 37 weeks gestation of pregnancy: Secondary | ICD-10-CM | POA: Diagnosis not present

## 2015-06-14 DIAGNOSIS — O4703 False labor before 37 completed weeks of gestation, third trimester: Secondary | ICD-10-CM | POA: Diagnosis not present

## 2015-06-14 DIAGNOSIS — O283 Abnormal ultrasonic finding on antenatal screening of mother: Secondary | ICD-10-CM

## 2015-06-14 DIAGNOSIS — R109 Unspecified abdominal pain: Secondary | ICD-10-CM | POA: Diagnosis present

## 2015-06-14 DIAGNOSIS — O471 False labor at or after 37 completed weeks of gestation: Secondary | ICD-10-CM | POA: Insufficient documentation

## 2015-06-14 DIAGNOSIS — Z3493 Encounter for supervision of normal pregnancy, unspecified, third trimester: Secondary | ICD-10-CM

## 2015-06-14 LAB — URINALYSIS, ROUTINE W REFLEX MICROSCOPIC
Bilirubin Urine: NEGATIVE
GLUCOSE, UA: NEGATIVE mg/dL
Hgb urine dipstick: NEGATIVE
KETONES UR: NEGATIVE mg/dL
LEUKOCYTES UA: NEGATIVE
NITRITE: NEGATIVE
Protein, ur: NEGATIVE mg/dL
Specific Gravity, Urine: 1.02 (ref 1.005–1.030)
UROBILINOGEN UA: 0.2 mg/dL (ref 0.0–1.0)
pH: 6 (ref 5.0–8.0)

## 2015-06-14 LAB — CULTURE, BETA STREP (GROUP B ONLY): Strep Gp B Culture: NEGATIVE

## 2015-06-14 MED ORDER — ZOLPIDEM TARTRATE 5 MG PO TABS: 5.0000 mg | ORAL_TABLET | Freq: Every evening | ORAL | Status: DC | PRN

## 2015-06-14 NOTE — Discharge Instructions (Signed)

## 2015-06-14 NOTE — MAU Provider Note (Signed)
History     CSN: [id number] Arrival date and time: 06/14/15 0906   First Provider Initiated Contact with Patient 06/14/15 1024      Chief Complaint  Patient presents with  . Abdominal Pain   HPI Patient is 29 y.o. [id number] [redacted]w[redacted]d here with complaints of abdominal pain.  Last night started having increased tightening. Reports this developed into sharp groin pain. The pain in the groin is worsening.  Groin pain is only with tightening of abdomen. Reports vagina pressure. "tightening" happening every 5-8 minutes for 1 hour then became every 10-15. They will space out and then have a "big strong one"  Reports increased mucous.   +FM, denies LOF, VB, contractions, vaginal discharge.  OB History    Gravida Para Term Preterm AB TAB SAB Ectopic Multiple Living   6 3 2 1 2  2   3       Past Medical History  Diagnosis Date  . Preterm labor     Past Surgical History  Procedure Laterality Date  . Tonsillectomy      Family History  Problem Relation Age of Onset  . Diabetes Maternal Aunt   . Hypertension Maternal Aunt   . Stroke Maternal Aunt   . Diabetes Maternal Uncle   . Hypertension Maternal Uncle   . Hypertension Paternal Aunt   . Hypertension Paternal Uncle   . Cancer Maternal Grandmother     breast  . Diabetes Maternal Grandmother   . Hypertension Maternal Grandmother   . Heart disease Maternal Grandfather   . Hypertension Maternal Grandfather   . Hypertension Paternal Grandmother   . Hypertension Paternal Grandfather   . Premature birth Daughter   . Asthma Daughter     Social History  Substance Use Topics  . Smoking status: Never Smoker   . Smokeless tobacco: Never Used  . Alcohol Use: No    Allergies: No Known Allergies  Prescriptions prior to admission  Medication Sig Dispense Refill Last Dose  . acetaminophen (TYLENOL) 325 MG tablet Take 650 mg by mouth every 6 (six) hours as needed for mild pain.    06/13/2015 at Unknown time  . calcium carbonate (TUMS  - DOSED IN MG ELEMENTAL CALCIUM) 500 MG chewable tablet Chew 2 tablets by mouth daily.   06/13/2015 at Unknown time  . cetirizine (ZYRTEC) 10 MG tablet Take 10 mg by mouth daily as needed for allergies.    06/13/2015 at Unknown time  . Prenatal Vit-Fe Fumarate-FA (MULTIVITAMIN-PRENATAL) 27-0.8 MG TABS tablet Take 1 tablet by mouth daily at 12 noon.   06/13/2015 at Unknown time    Review of Systems  Constitutional: Negative for fever and chills.  Eyes: Negative for blurred vision and double vision.  Respiratory: Negative for cough and shortness of breath.   Cardiovascular: Negative for chest pain and orthopnea.  Gastrointestinal: Negative for nausea and vomiting.  Genitourinary: Negative for dysuria, frequency and flank pain.  Musculoskeletal: Negative for myalgias.  Skin: Negative for rash.  Neurological: Negative for dizziness, tingling, weakness and headaches.  Endo/Heme/Allergies: Does not bruise/bleed easily.  Psychiatric/Behavioral: Negative for depression and suicidal ideas. The patient is not nervous/anxious.    Physical Exam   Blood pressure 112/68, pulse 89, temperature 98.2 F (36.8 C), temperature source Oral, resp. rate 17, height 5\' 9"  (1.753 m), weight 188 lb (85.276 kg), last menstrual period 09/19/2014, SpO2 100 %.  Physical Exam  Nursing note and vitals reviewed. Constitutional: She is oriented to person, place, and time. She appears well-developed and well-nourished.  No distress.  Pregnant female  HENT:  Head: Normocephalic and atraumatic.  Eyes: Conjunctivae are normal. No scleral icterus.  Neck: Normal range of motion. Neck supple.  Cardiovascular: Normal rate and intact distal pulses.   Respiratory: Effort normal. She exhibits no tenderness.  GI: Soft. There is no tenderness. There is no rebound and no guarding.  Gravid  Genitourinary: Vagina normal.  Musculoskeletal: Normal range of motion. She exhibits no edema.  Neurological: She is alert and oriented to  person, place, and time.  Skin: Skin is warm and dry. No rash noted.  Psychiatric: She has a normal mood and affect.   Dilation: 4.5 Effacement (%): 50 Exam by:: Lauretta Chester MD   MAU Course  Procedures  MDM NST  140/mod/+accels, no decels Toco: quiet  Assessment and Plan  Linda Reid is 29 y.o. [phone] at [redacted]w[redacted]d currently in Latent labor Labor precautions discussed in detail and patient voiced understanding.  Ambien rx for therapeutic rest Discussed warm bath, tylenol for pain control.  Follow up with OB provider  Juanita Craver [facility] 06/14/2015, 11:07 AM

## 2015-06-14 NOTE — MAU Note (Signed)
Patient present with abdominal tightness and sharp groin pain pain starting at 2300 last night. Pain has got progressively worse through the night. Patient denies any bleeding. Fetus active.

## 2015-06-16 ENCOUNTER — Telehealth: Payer: Self-pay | Admitting: Women's Health

## 2015-06-16 NOTE — Telephone Encounter (Signed)
Pt c/o contractions 5-10 minutes apart early this morning, now not as frequent, no gush fluids, + FM, no vaginal bleeding, c/o  "Trickle" mucus discharge yesterday but none today. Pt states was seen at [facility] on 06/14/2015 dilated to a 5 cm, 50% effacement.  Discussed with Knute Neu, CNM and pt informed to continue to monitor contractions 5-10 apart or gush of fluids go to [facility], keep her appt tomorrow with Knute Neu at 10:15 am. Pt verbalized understanding.

## 2015-06-17 ENCOUNTER — Encounter: Payer: Self-pay | Admitting: Women's Health

## 2015-06-17 ENCOUNTER — Ambulatory Visit (INDEPENDENT_AMBULATORY_CARE_PROVIDER_SITE_OTHER): Payer: Medicaid Other | Admitting: Women's Health

## 2015-06-17 VITALS — BP 98/64 | HR 84 | Wt 186.0 lb

## 2015-06-17 DIAGNOSIS — Z331 Pregnant state, incidental: Secondary | ICD-10-CM

## 2015-06-17 DIAGNOSIS — Z3A37 37 weeks gestation of pregnancy: Secondary | ICD-10-CM | POA: Diagnosis not present

## 2015-06-17 DIAGNOSIS — Z1389 Encounter for screening for other disorder: Secondary | ICD-10-CM

## 2015-06-17 DIAGNOSIS — O26843 Uterine size-date discrepancy, third trimester: Secondary | ICD-10-CM

## 2015-06-17 DIAGNOSIS — Z3493 Encounter for supervision of normal pregnancy, unspecified, third trimester: Secondary | ICD-10-CM

## 2015-06-17 LAB — POCT URINALYSIS DIPSTICK
Blood, UA: NEGATIVE
Glucose, UA: NEGATIVE
KETONES UA: NEGATIVE
Leukocytes, UA: NEGATIVE
Nitrite, UA: NEGATIVE
PROTEIN UA: NEGATIVE

## 2015-06-17 NOTE — Progress Notes (Signed)
Low-risk OB appointment [id number] [redacted]w[redacted]d Estimated Date of Delivery: 07/02/15 BP 98/64 mmHg  Pulse 84  Wt 186 lb (84.369 kg)  LMP 09/19/2014 (Exact Date)  BP, weight, and urine reviewed.  Refer to obstetrical flow sheet for FH & FHR.  Reports good fm.  Denies regular uc's, lof, vb, or uti s/s. Constant RLQ pain, no n/v/fever/chills- likely stretched round ligament Some sporadic leakage of watery discharge SSE: cx visually 2cm, no pooling, no change w/ valsalva- mod amount thick mucousy d/c w/o odor, fern & nitrazine neg SVE per request: 4/80/-2, vtx Reviewed labor s/s, fkc. Plan:  Continue routine obstetrical care  F/U asap for efw/afi u/s for no fh change in 3 visits (no visit), then 1wk for OB appointment

## 2015-06-17 NOTE — Patient Instructions (Signed)
Call the office ([phone]) or go to [facility] if:  You begin to have strong, frequent contractions  Your water breaks.  Sometimes it is a big gush of fluid, sometimes it is just a trickle that keeps getting your panties wet or running down your legs  You have vaginal bleeding.  It is normal to have a small amount of spotting if your cervix was checked.   You don't feel your baby moving like normal.  If you don't, get you something to eat and drink and lay down and focus on feeling your baby move.  You should feel at least 10 movements in 2 hours.  If you don't, you should call the office or go to [facility].    Braxton Hicks Contractions Contractions of the uterus can occur throughout pregnancy. Contractions are not always a sign that you are in labor.  WHAT ARE BRAXTON HICKS CONTRACTIONS?  Contractions that occur before labor are called Braxton Hicks contractions, or false labor. Toward the end of pregnancy (32-34 weeks), these contractions can develop more often and may become more forceful. This is not true labor because these contractions do not result in opening (dilatation) and thinning of the cervix. They are sometimes difficult to tell apart from true labor because these contractions can be forceful and people have different pain tolerances. You should not feel embarrassed if you go to the hospital with false labor. Sometimes, the only way to tell if you are in true labor is for your health care provider to look for changes in the cervix. If there are no prenatal problems or other health problems associated with the pregnancy, it is completely safe to be sent home with false labor and await the onset of true labor. HOW CAN YOU TELL THE DIFFERENCE BETWEEN TRUE AND FALSE LABOR? False Labor  The contractions of false labor are usually shorter and not as hard as those of true labor.   The contractions are usually irregular.   The contractions are often felt in the front of  the lower abdomen and in the groin.   The contractions may go away when you walk around or change positions while lying down.   The contractions get weaker and are shorter lasting as time goes on.   The contractions do not usually become progressively stronger, regular, and closer together as with true labor.  True Labor  Contractions in true labor last 30-70 seconds, become very regular, usually become more intense, and increase in frequency.   The contractions do not go away with walking.   The discomfort is usually felt in the top of the uterus and spreads to the lower abdomen and low back.   True labor can be determined by your health care provider with an exam. This will show that the cervix is dilating and getting thinner.  WHAT TO REMEMBER  Keep up with your usual exercises and follow other instructions given by your health care provider.   Take medicines as directed by your health care provider.   Keep your regular prenatal appointments.   Eat and drink lightly if you think you are going into labor.   If Braxton Hicks contractions are making you uncomfortable:   Change your position from lying down or resting to walking, or from walking to resting.   Sit and rest in a tub of warm water.   Drink 2-3 glasses of water. Dehydration may cause these contractions.   Do slow and deep breathing several times an hour.    WHEN SHOULD I SEEK IMMEDIATE MEDICAL CARE? Seek immediate medical care if:  Your contractions become stronger, more regular, and closer together.   You have fluid leaking or gushing from your vagina.   You have a fever.   You pass blood-tinged mucus.   You have vaginal bleeding.   You have continuous abdominal pain.   You have low back pain that you never had before.   You feel your baby's head pushing down and causing pelvic pressure.   Your baby is not moving as much as it used to.    This information is not intended to  replace advice given to you by your health care provider. Make sure you discuss any questions you have with your health care provider.   Document Released: 08/09/2005 Document Revised: 08/14/2013 Document Reviewed: 05/21/2013 Elsevier Interactive Patient Education [employer].

## 2015-06-18 ENCOUNTER — Ambulatory Visit (INDEPENDENT_AMBULATORY_CARE_PROVIDER_SITE_OTHER): Payer: Medicaid Other

## 2015-06-18 DIAGNOSIS — O26843 Uterine size-date discrepancy, third trimester: Secondary | ICD-10-CM | POA: Diagnosis not present

## 2015-06-18 DIAGNOSIS — O283 Abnormal ultrasonic finding on antenatal screening of mother: Secondary | ICD-10-CM

## 2015-06-18 NOTE — Progress Notes (Addendum)
[name] 38wks,cephalic,afi 61.4ER,XVQMG adnexa wnl,efw 3229g 50%,HC <2% limited view of head because of fetal pos,fhr 144bpm,post fundal pl gr 2

## 2015-06-19 ENCOUNTER — Encounter (HOSPITAL_COMMUNITY): Payer: Self-pay | Admitting: *Deleted

## 2015-06-19 ENCOUNTER — Inpatient Hospital Stay (EMERGENCY_DEPARTMENT_HOSPITAL)
Admission: AD | Admit: 2015-06-19 | Discharge: 2015-06-19 | Disposition: A | Discharge status: Home / Self Care | Payer: Medicaid Other | Source: Ambulatory Visit | Attending: Obstetrics & Gynecology | Admitting: Obstetrics & Gynecology

## 2015-06-19 DIAGNOSIS — M79604 Pain in right leg: Secondary | ICD-10-CM | POA: Diagnosis not present

## 2015-06-19 DIAGNOSIS — M79601 Pain in right arm: Secondary | ICD-10-CM | POA: Diagnosis not present

## 2015-06-19 LAB — URINALYSIS, ROUTINE W REFLEX MICROSCOPIC
BILIRUBIN URINE: NEGATIVE
Glucose, UA: NEGATIVE mg/dL
Hgb urine dipstick: NEGATIVE
Ketones, ur: NEGATIVE mg/dL
Leukocytes, UA: NEGATIVE
NITRITE: NEGATIVE
PH: 6.5 (ref 5.0–8.0)
Protein, ur: NEGATIVE mg/dL
SPECIFIC GRAVITY, URINE: 1.01 (ref 1.005–1.030)
UROBILINOGEN UA: 1 mg/dL (ref 0.0–1.0)

## 2015-06-19 MED ORDER — OXYCODONE-ACETAMINOPHEN 5-325 MG PO TABS: 2.0000 | ORAL_TABLET | ORAL | Status: DC | PRN

## 2015-06-19 MED ORDER — OXYCODONE-ACETAMINOPHEN 5-325 MG PO TABS
2.0000 | ORAL_TABLET | Freq: Once | ORAL | Status: AC
  Administered 2015-06-19: 2 via ORAL
  Filled 2015-06-19: qty 2

## 2015-06-19 NOTE — Discharge Instructions (Signed)

## 2015-06-19 NOTE — MAU Provider Note (Signed)
History     CSN: [id number]  Arrival date and time: 06/19/15 0126   None     Chief Complaint  Patient presents with  . Abdominal Pain   HPI Linda Reid is a 29yo [id number] @ 38.1wks by 7wk scan who presents for worsening pain in right groin that is now constant. For the past week or so the pain only came w/ ctx, but now it does not go away. She states that Tylenol doesn't help. Reports irreg ctx; no leaking or bldg. Her preg has been followed by the [facility] service and has been essentially unremarkable other than hx PTDs between 26-36wks.  OB History    Gravida Para Term Preterm AB TAB SAB Ectopic Multiple Living   6 3 0 3 2 0 2 0 0 3       Past Medical History  Diagnosis Date  . Preterm labor     Past Surgical History  Procedure Laterality Date  . Tonsillectomy      Family History  Problem Relation Age of Onset  . Diabetes Maternal Aunt   . Hypertension Maternal Aunt   . Stroke Maternal Aunt   . Diabetes Maternal Uncle   . Hypertension Maternal Uncle   . Hypertension Paternal Aunt   . Hypertension Paternal Uncle   . Cancer Maternal Grandmother     breast  . Diabetes Maternal Grandmother   . Hypertension Maternal Grandmother   . Heart disease Maternal Grandfather   . Hypertension Maternal Grandfather   . Hypertension Paternal Grandmother   . Hypertension Paternal Grandfather   . Premature birth Daughter   . Asthma Daughter     Social History  Substance Use Topics  . Smoking status: Never Smoker   . Smokeless tobacco: Never Used  . Alcohol Use: No    Allergies: No Known Allergies  Prescriptions prior to admission  Medication Sig Dispense Refill Last Dose  . acetaminophen (TYLENOL) 325 MG tablet Take 650 mg by mouth every 6 (six) hours as needed for mild pain.    Taking  . calcium carbonate (TUMS - DOSED IN MG ELEMENTAL CALCIUM) 500 MG chewable tablet Chew 2 tablets by mouth daily.   Taking  . cetirizine (ZYRTEC) 10 MG tablet Take 10 mg by mouth daily  as needed for allergies.    Taking  . Prenatal Vit-Fe Fumarate-FA (MULTIVITAMIN-PRENATAL) 27-0.8 MG TABS tablet Take 1 tablet by mouth daily at 12 noon.   Taking  . zolpidem (AMBIEN) 5 MG tablet Take 1 tablet (5 mg total) by mouth at bedtime as needed for sleep. (Patient not taking: Reported on 06/17/2015) 3 tablet 0 Not Taking    ROS Physical Exam   Blood pressure 118/85, pulse 93, temperature 97.7 F (36.5 C), temperature source Oral, resp. rate 16, height 5\' 8"  (1.727 m), weight 86.002 kg (189 lb 9.6 oz), last menstrual period 09/19/2014, SpO2 98 %.  Physical Exam  Constitutional: She is oriented to person, place, and time. She appears well-developed.  HENT:  Head: Normocephalic.  Neck: Normal range of motion.  Cardiovascular: Normal rate.   Respiratory: Effort normal.  GI:  EFM +accels, no decels Rare ctx on toco  Musculoskeletal:  Amb without difficulty; right leg: needs assistance w/ lifting; no edema or erythema  Neurological: She is alert and oriented to person, place, and time.  Skin: Skin is warm and dry.  Psychiatric: She has a normal mood and affect. Her behavior is normal. Thought content normal.    MAU Course  Procedures  MDM NST read Given Percocet #2 in MAU for some relief  Assessment and Plan  IUP@38 .1wks Right groin pain, nerve vs musculoskeletal  D/C home w/ comfort measures Rec call [facility] to see about PT referral for exercises  Demetra Moya CNM 06/19/2015, 2:36 AM

## 2015-06-19 NOTE — MAU Note (Signed)
Pt c/o right groin pain. Has gotten worse over the last few hours. Did not take anything for the pain. Having some irregular contractions but not painful. Denies vag bleeding or LOF.

## 2015-06-20 ENCOUNTER — Inpatient Hospital Stay (HOSPITAL_COMMUNITY)
Admission: RE | Admit: 2015-06-20 | Discharge: 2015-06-22 | DRG: 775 | Disposition: A | Discharge status: Home / Self Care | Payer: Medicaid Other | Source: Ambulatory Visit | Attending: Obstetrics and Gynecology | Admitting: Obstetrics and Gynecology

## 2015-06-20 ENCOUNTER — Encounter (HOSPITAL_COMMUNITY): Payer: Self-pay | Admitting: *Deleted

## 2015-06-20 ENCOUNTER — Telehealth: Payer: Self-pay | Admitting: *Deleted

## 2015-06-20 DIAGNOSIS — M79601 Pain in right arm: Secondary | ICD-10-CM | POA: Diagnosis not present

## 2015-06-20 DIAGNOSIS — Z833 Family history of diabetes mellitus: Secondary | ICD-10-CM

## 2015-06-20 DIAGNOSIS — IMO0001 Reserved for inherently not codable concepts without codable children: Secondary | ICD-10-CM

## 2015-06-20 DIAGNOSIS — M79604 Pain in right leg: Secondary | ICD-10-CM | POA: Diagnosis not present

## 2015-06-20 DIAGNOSIS — M79605 Pain in left leg: Secondary | ICD-10-CM | POA: Diagnosis not present

## 2015-06-20 DIAGNOSIS — Z823 Family history of stroke: Secondary | ICD-10-CM | POA: Diagnosis not present

## 2015-06-20 DIAGNOSIS — Z3A38 38 weeks gestation of pregnancy: Secondary | ICD-10-CM

## 2015-06-20 DIAGNOSIS — O283 Abnormal ultrasonic finding on antenatal screening of mother: Secondary | ICD-10-CM

## 2015-06-20 DIAGNOSIS — Z8249 Family history of ischemic heart disease and other diseases of the circulatory system: Secondary | ICD-10-CM | POA: Diagnosis not present

## 2015-06-20 LAB — CBC
HCT: 37.6 % (ref 36.0–46.0)
Hemoglobin: 12.7 g/dL (ref 12.0–15.0)
MCH: 30.3 pg (ref 26.0–34.0)
MCHC: 33.8 g/dL (ref 30.0–36.0)
MCV: 89.7 fL (ref 78.0–100.0)
PLATELETS: 209 10*3/uL (ref 150–400)
RBC: 4.19 MIL/uL (ref 3.87–5.11)
RDW: 12.8 % (ref 11.5–15.5)
WBC: 11.9 10*3/uL — ABNORMAL HIGH (ref 4.0–10.5)

## 2015-06-20 LAB — TYPE AND SCREEN
ABO/RH(D): AB POS
ANTIBODY SCREEN: NEGATIVE

## 2015-06-20 LAB — ABO/RH: ABO/RH(D): AB POS

## 2015-06-20 MED ORDER — TETANUS-DIPHTH-ACELL PERTUSSIS 5-2.5-18.5 LF-MCG/0.5 IM SUSP
0.5000 mL | Freq: Once | INTRAMUSCULAR | Status: AC
  Administered 2015-06-21: 0.5 mL via INTRAMUSCULAR
  Filled 2015-06-20: qty 0.5

## 2015-06-20 MED ORDER — FLEET ENEMA 7-19 GM/118ML RE ENEM: 1.0000 | ENEMA | RECTAL | Status: DC | PRN

## 2015-06-20 MED ORDER — EPHEDRINE 5 MG/ML INJ
10.0000 mg | INTRAVENOUS | Status: DC | PRN
  Filled 2015-06-20: qty 2

## 2015-06-20 MED ORDER — DIPHENHYDRAMINE HCL 25 MG PO CAPS: 25.0000 mg | ORAL_CAPSULE | Freq: Four times a day (QID) | ORAL | Status: DC | PRN

## 2015-06-20 MED ORDER — IBUPROFEN 600 MG PO TABS
600.0000 mg | ORAL_TABLET | Freq: Four times a day (QID) | ORAL | Status: DC
  Administered 2015-06-20 – 2015-06-22 (×8): 600 mg via ORAL
  Filled 2015-06-20 (×8): qty 1

## 2015-06-20 MED ORDER — DIPHENHYDRAMINE HCL 50 MG/ML IJ SOLN: 12.5000 mg | INTRAMUSCULAR | Status: DC | PRN

## 2015-06-20 MED ORDER — FENTANYL CITRATE (PF) 100 MCG/2ML IJ SOLN
100.0000 ug | INTRAMUSCULAR | Status: DC | PRN
  Administered 2015-06-20: 100 ug via INTRAVENOUS

## 2015-06-20 MED ORDER — ZOLPIDEM TARTRATE 5 MG PO TABS: 5.0000 mg | ORAL_TABLET | Freq: Every evening | ORAL | Status: DC | PRN

## 2015-06-20 MED ORDER — LANOLIN HYDROUS EX OINT: TOPICAL_OINTMENT | CUTANEOUS | Status: DC | PRN

## 2015-06-20 MED ORDER — BENZOCAINE-MENTHOL 20-0.5 % EX AERO
1.0000 "application " | INHALATION_SPRAY | CUTANEOUS | Status: DC | PRN
  Administered 2015-06-20: 1 via TOPICAL
  Filled 2015-06-20: qty 56

## 2015-06-20 MED ORDER — FENTANYL 2.5 MCG/ML BUPIVACAINE 1/10 % EPIDURAL INFUSION (WH - ANES): 14.0000 mL/h | INTRAMUSCULAR | Status: DC | PRN

## 2015-06-20 MED ORDER — OXYTOCIN 40 UNITS IN LACTATED RINGERS INFUSION - SIMPLE MED
62.5000 mL/h | INTRAVENOUS | Status: DC
  Administered 2015-06-20: 62.5 mL/h via INTRAVENOUS
  Filled 2015-06-20: qty 1000

## 2015-06-20 MED ORDER — OXYCODONE-ACETAMINOPHEN 5-325 MG PO TABS
1.0000 | ORAL_TABLET | ORAL | Status: DC | PRN
  Administered 2015-06-21: 1 via ORAL
  Filled 2015-06-20: qty 1

## 2015-06-20 MED ORDER — ACETAMINOPHEN 325 MG PO TABS: 650.0000 mg | ORAL_TABLET | ORAL | Status: DC | PRN

## 2015-06-20 MED ORDER — CITRIC ACID-SODIUM CITRATE 334-500 MG/5ML PO SOLN: 30.0000 mL | ORAL | Status: DC | PRN

## 2015-06-20 MED ORDER — OXYCODONE-ACETAMINOPHEN 5-325 MG PO TABS: 2.0000 | ORAL_TABLET | ORAL | Status: DC | PRN

## 2015-06-20 MED ORDER — SIMETHICONE 80 MG PO CHEW: 80.0000 mg | CHEWABLE_TABLET | ORAL | Status: DC | PRN

## 2015-06-20 MED ORDER — LACTATED RINGERS IV SOLN: 500.0000 mL | INTRAVENOUS | Status: DC | PRN

## 2015-06-20 MED ORDER — LACTATED RINGERS IV SOLN: INTRAVENOUS | Status: DC

## 2015-06-20 MED ORDER — FENTANYL CITRATE (PF) 100 MCG/2ML IJ SOLN
INTRAMUSCULAR | Status: AC
  Filled 2015-06-20: qty 2

## 2015-06-20 MED ORDER — OXYTOCIN BOLUS FROM INFUSION
500.0000 mL | INTRAVENOUS | Status: DC
  Administered 2015-06-20: 500 mL via INTRAVENOUS

## 2015-06-20 MED ORDER — ONDANSETRON HCL 4 MG/2ML IJ SOLN: 4.0000 mg | INTRAMUSCULAR | Status: DC | PRN

## 2015-06-20 MED ORDER — PRENATAL MULTIVITAMIN CH
1.0000 | ORAL_TABLET | Freq: Every day | ORAL | Status: DC
  Administered 2015-06-21 – 2015-06-22 (×2): 1 via ORAL
  Filled 2015-06-20 (×2): qty 1

## 2015-06-20 MED ORDER — LIDOCAINE HCL (PF) 1 % IJ SOLN
30.0000 mL | INTRAMUSCULAR | Status: AC | PRN
  Administered 2015-06-20: 30 mL via SUBCUTANEOUS
  Filled 2015-06-20: qty 30

## 2015-06-20 MED ORDER — ONDANSETRON HCL 4 MG/2ML IJ SOLN: 4.0000 mg | Freq: Four times a day (QID) | INTRAMUSCULAR | Status: DC | PRN

## 2015-06-20 MED ORDER — PHENYLEPHRINE 40 MCG/ML (10ML) SYRINGE FOR IV PUSH (FOR BLOOD PRESSURE SUPPORT)
80.0000 ug | PREFILLED_SYRINGE | INTRAVENOUS | Status: DC | PRN
Start: 2015-06-20 — End: 2015-06-20
  Filled 2015-06-20: qty 2

## 2015-06-20 MED ORDER — ONDANSETRON HCL 4 MG PO TABS: 4.0000 mg | ORAL_TABLET | ORAL | Status: DC | PRN

## 2015-06-20 MED ORDER — OXYCODONE-ACETAMINOPHEN 5-325 MG PO TABS
1.0000 | ORAL_TABLET | ORAL | Status: DC | PRN
  Administered 2015-06-21 (×2): 1 via ORAL
  Filled 2015-06-20 (×2): qty 1

## 2015-06-20 MED ORDER — DIBUCAINE 1 % RE OINT: 1.0000 "application " | TOPICAL_OINTMENT | RECTAL | Status: DC | PRN

## 2015-06-20 MED ORDER — WITCH HAZEL-GLYCERIN EX PADS: 1.0000 "application " | MEDICATED_PAD | CUTANEOUS | Status: DC | PRN

## 2015-06-20 MED ORDER — SENNOSIDES-DOCUSATE SODIUM 8.6-50 MG PO TABS
2.0000 | ORAL_TABLET | ORAL | Status: DC
  Administered 2015-06-21: 2 via ORAL
  Filled 2015-06-20: qty 2

## 2015-06-20 NOTE — MAU Note (Signed)
Pt reports having ct for about 45 min. Has been dilated to 5 in office. Pt very uncomfortable. Placed pt on monitor and SVE 7/80/0. Dr, Conley Canal called and bed assigned.

## 2015-06-20 NOTE — Telephone Encounter (Signed)
Pt called and stated that she has had a mucus blood tinged discharge since last night. Pt states that she has started having hard contractions in the past thirty minutes that are about 5-10 minutes apart, pt had a contraction while the phone and she was unable to talk, was told to try and breath through them. Pt denies any gush of fluid or bleeding. Pt stated that she had good baby movement. Pt states that at her last visit she was dilated to a 5. I advised the pt to go ahead and get someone to take her to [facility] due to her contractions and already being dilated to a 5. Pt verbalized understanding.

## 2015-06-20 NOTE — H&P (Signed)
LABOR AND DELIVERY ADMISSION HISTORY AND PHYSICAL NOTE  Linda Reid is a 29 y.o. female [id number] with IUP at [redacted]w[redacted]d by 7 week ultrasound presents for increasing contractions and delivery. Patient states that at 4am she noticed herself passing the mucus plug, and at 8 am she saw more of the plug come out. She started to feel minor contractions at 9:30 and by 10am she was having painful contractions while on the phone with her PCP ([facility]), who told her to go to the hospital. Upon arrival at the MAU, she was in obvious discomfort and on SVE evaluated to be dilated to 7 cm by the MAU provider. She was subsequently admitted to L&D, and noted to be 9 cm on arrival to the floor. She continued to progress in active labor without issue, augmentation, or analgesia. At 1314 she delivered a healthy baby Linda via uncomplicated spontaneous vaginal delivery (LOA). See delivery note for details.  She denies any recent fevers, bleeding, chills, headaches, blurred vision, decreased urination, constipation, nausea, vomiting, no RUQ pain or epigastric pain.  Prenatal History/Complications: Patient reports good perinatal care at [facility], where she reports having no abnormal U/S or Maternal test results. Her fetal DNA screen reveled the sex of the baby with no other abnormalities.   Paden  Initiated Care at  9.5wks  FOB  [facility] [place], [place], 1st baby, engaged  Dating By  7wk u/s  Pap  2015 [employer]- normal  GC/CT  Initial: -/+    POC: -/-           36+wks:  -/-  Genetic Screen  NT/IT: Thickened NT, CFDNA normal  CF screen  neg  Anatomic [name]  Normal female '[place]'  Flu vaccine  In [employer]  Tdap  Recommended ~ 28wks  ABO, Rh:  AB POS, AB POS  Rh Antibody:  NEG (10/28 1157)  Rubella:   Immune  RPR:  Non Reactive  HBsAg  Negative   HIV:  Non Reactive  Glucose Screen   2 hr 83/122/90 = nl  GBS  neg  Feed Preference  Breast/bottle  Contraception  Nexplanon  Circumcision  n/a   Childbirth Classes  declined  Pediatrician  [facility] [place]    Past Medical History: She denies any other medically diagnosed illness / disease.   Past Surgical History: Past Surgical History  Procedure Laterality Date  . Tonsillectomy     Obstetrical History: Obstetric History   G6   P4   T1   P3   A2   TAB0   SAB2   E0   M0   L4     # Outcome Date GA Lbr Len/2nd Weight Sex Delivery Anes PTL Lv  6 Term 06/20/15 [redacted]w[redacted]d 01:50 / 00:14 3565 g (7 lb 13.8 oz) F Vag-Spont None  Y     Name: Linda Reid,Linda Reid     Apgar1:  8                Apgar5: 9  5 Preterm 12/07/06 [redacted]w[redacted]d  3572 g (7 lb 14 oz) M Vag-Spont EPI N Y  4 Preterm 01/23/05 [redacted]w[redacted]d  3345 g (7 lb 6 oz) M Vag-Spont EPI N Y  3 Preterm 12/11/03 [redacted]w[redacted]d  907 g (2 lb) F Vag-Spont EPI Y Y  2 SAB           1 SAB              Social History: Social History   Social History  .  Marital Status: Single    Spouse Name: Linda Reid   . Number of Children: 3  . Years of Education: 4 Bachelor   Occupational History  . 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 Smoking status: Never Smoker   . Smokeless tobacco: Never Used  . Alcohol Use: No  . Drug Use: No  . Sexual Activity: Not Currently    Birth Control/ Protection: None   Other Topics Concern  . None   Social History Narrative   Patient lives with her fiance and 3 children. Her fiance is the father of her current pregnancy, other three children were fathered by her previous partner. 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      Family History: Family History  Problem Relation Age of Onset  . Diabetes Maternal Aunt   . Hypertension Maternal Aunt   . Stroke Maternal Aunt   . Diabetes Maternal Uncle   . Hypertension Maternal Uncle   . Hypertension Paternal Aunt   . Hypertension Paternal Uncle   . Cancer  Maternal Grandmother     breast  . Diabetes Maternal Grandmother   . Hypertension Maternal Grandmother   . Heart disease Maternal Grandfather   . Hypertension Maternal Grandfather   . Hypertension Paternal Grandmother   . Hypertension Paternal Grandfather   . Premature birth Daughter   . Asthma Daughter     Allergies: No Known Allergies  Prescriptions prior to admission  Medication Sig Dispense Refill Last Dose  . acetaminophen (TYLENOL) 325 MG tablet Take 650 mg by mouth every 6 (six) hours as needed for mild pain.    Taking  . calcium carbonate (TUMS - DOSED IN MG ELEMENTAL CALCIUM) 500 MG chewable tablet Chew 2 tablets by mouth daily.   Taking  . cetirizine (ZYRTEC) 10 MG tablet Take 10 mg by mouth daily as needed for allergies.    Taking  . oxyCODONE-acetaminophen (PERCOCET/ROXICET) 5-325 MG tablet Take 2 tablets by mouth every 4 (four) hours as needed for severe pain. 10 tablet 0   . Prenatal Vit-Fe Fumarate-FA (MULTIVITAMIN-PRENATAL) 27-0.8 MG TABS tablet Take 1 tablet by mouth daily at 12 noon.   Taking    Review of Systems  Review of Systems  Constitutional: Negative for fever and chills.  HENT: Negative for nosebleeds and sore throat.   Eyes: Negative for blurred vision and double vision.  Respiratory: Negative for cough and shortness of breath.   Cardiovascular: Negative for chest pain and palpitations.  Gastrointestinal: Positive for abdominal pain. Negative for nausea, vomiting, diarrhea and blood in stool.  Genitourinary: Negative for dysuria and hematuria.  Musculoskeletal: Positive for back pain.  Neurological: Negative for dizziness, loss of consciousness and headaches.  Psychiatric/Behavioral: Negative for substance abuse.    Filed Vitals:   06/20/15 1415 06/20/15 1430 06/20/15 1445 06/20/15 1530  BP: 114/56 109/65 102/55 117/56  Pulse: 87 86 108 90  Temp:      TempSrc:      Resp: 20 18 20 20   SpO2:    100%    GEN: alert, comfortable-appearing woman  resting in bed PULM: CTAB on frontal field exam CV: RRR, S1 and S2 heard, no M/R/G appreciated ABD: fundus palpable. Abdomen NTTP. No epigastric of RUQ pain. No guarding.  GU: Cervix complete at time of admission. Please see delivery note for  details. EXTR: No LE edema or calf tenderness.  FHT: HR  / moderate variability / +accels / Early decelerations present, no lates and no variables Toco: frequent, adequate ctx (q 1 min)   Prenatal Transfer Tool  Maternal Diabetes: No Genetic Screening: Normal Maternal Ultrasounds/Referrals: Normal Fetal Ultrasounds or other Referrals:  thickeded NT. CFDNA:  Normal  Refer to MFM for genetic counseling- declined Maternal Substance Abuse:  No Significant Maternal Medications:  None Significant Maternal Lab Results: None  Results for orders placed or performed during the hospital encounter of 06/20/15 (from the past 24 hour(s))  CBC   Collection Time: 06/20/15 11:57 AM  Result Value Ref Range   WBC 11.9 (H) 4.0 - 10.5 K/uL   RBC 4.19 3.87 - 5.11 MIL/uL   Hemoglobin 12.7 12.0 - 15.0 g/dL   HCT 37.6 36.0 - 46.0 %   MCV 89.7 78.0 - 100.0 fL   MCH 30.3 26.0 - 34.0 pg   MCHC 33.8 30.0 - 36.0 g/dL   RDW 12.8 11.5 - 15.5 %   Platelets 209 150 - 400 K/uL  Type and screen [place]   Collection Time: 06/20/15 11:57 AM  Result Value Ref Range   ABO/RH(D) AB POS    Antibody Screen NEG    Sample Expiration 06/23/2015   ABO/Rh   Collection Time: 06/20/15 11:57 AM  Result Value Ref Range   ABO/RH(D) AB POS     Patient Active Problem List   Diagnosis Date Noted  . Active labor at term 06/20/2015  . Second trimester bleeding 01/08/2015  . Abnormal ultrasonic finding on antenatal screening of mother 12/26/2014  . Chlamydia infection affecting pregnancy in first trimester, antepartum 12/04/2014  . Supervision of normal pregnancy 12/02/2014  . History of preterm delivery, currently pregnant 12/02/2014    Assessment: Linda Reid is a 29 y.o. [id number] at [redacted]w[redacted]d here for delivery of a term female. On arrival she was admitted to L&D for SOL and immediately thereafter delivered a healthy newborn baby Linda of 7 lb 13.8 oz (3565 g). -- Labor: SOL followed by SVD - see delivery summary for details. -- Augmentation with AROM -- GBS: negative -- Feeding: Breast -- Contraception: *Nexplanon to be placed at [facility] after discharge Nexplanon ordered on Tuesday per patient -- Transition to Mother-Baby unit for routine postpartum care  Diet: Regular PPx: None for now, low risk encouraged ambulation Code Status: FULL Dispo: Anticipate at least 1 midnight stay in house for routine post-partum care.   Ruby Cola, Med Student 06/20/2015, 4:11 PM   Keane Scrape, MD  06/20/2015 5:06 PM

## 2015-06-20 NOTE — Lactation Note (Signed)
This note was copied from the chart of Linda Reid. Lactation Consultation Note  Patient Name: Linda Shenae Bonanno [id number] Date: 06/20/2015 Reason for consult: Initial assessment Baby at 8 hr of life and mom reports bf is going well. She bf her oldest for 2 wk until she got plugged ducts and mastitis, the MD told her to stop bf at that point. She never attempted to bf the middle 2 children. She want to bf and formula feed this baby. She is willing to ebf for 2 wk then start formula. She did not seem interested in pumping. Given lactation handouts, aware of O/P services and support group. She will call as needed for bf help.   Maternal Data Has patient been taught Hand Expression?: Yes  Feeding Feeding Type: Breast Fed Length of feed: 20 min  LATCH Score/Interventions                      Lactation Tools Discussed/Used WIC Program: No   Consult Status Consult Status: Follow-up Date: 06/21/15 Follow-up type: In-patient    Denzil Hughes 06/20/2015, 9:57 PM

## 2015-06-21 ENCOUNTER — Inpatient Hospital Stay (HOSPITAL_COMMUNITY): Payer: Medicaid Other

## 2015-06-21 DIAGNOSIS — M79605 Pain in left leg: Secondary | ICD-10-CM

## 2015-06-21 LAB — RPR: RPR Ser Ql: NONREACTIVE

## 2015-06-21 NOTE — Progress Notes (Signed)
Post Partum Day 1  Subjective:  Linda Reid is a 29 y.o. [id number] [redacted]w[redacted]d s/p SVD at 1300 10/28.  No acute events overnight.  Pt denies problems with ambulating, voiding or po intake. However, she does endorse some cramping of her right LE, mostly of her shin.  She denies nausea or vomiting.  Pain is well controlled.  She has had flatus. She has not had bowel movement.  Lochia Minimal.  Plan for birth control is Nexplanon.  Method of Feeding: Breast. She denies SOB. She reports a positive family history for blood clots, including 2 maternal aunts and grandmother.  Objective: BP 105/55 mmHg  Pulse 79  Temp(Src) 98 F (36.7 C) (Oral)  Resp 18  SpO2 100%  LMP 09/19/2014 (Exact Date)  Breastfeeding? Unknown  Physical Exam:  General: alert, cooperative and no distress Lochia:normal flow Chest: CTAB Heart: RRR no m/r/g Abdomen: +BS, soft, nontender Uterine Fundus: firm, at umbilicus DVT Evaluation: + Homan's sign on right. LE swelling grossly symmetric. Mild TTP of right LE.  Extremities: Trace LE edema   Recent Labs  06/20/15 1157  HGB 12.7  HCT 37.6    Assessment/Plan:  ASSESSMENT: Linda Reid is a 29 y.o. [id number] [redacted]w[redacted]d ppd #1 s/p NSVD doing well. Given family history of blood clots and unilateral leg pain, will order Dopplers of LE to r/o DVT.   Plan for discharge tomorrow, Breastfeeding and Contraception Nexplanon   LOS: 1 day   Darci Needle 06/21/2015, 10:53 AM

## 2015-06-21 NOTE — Progress Notes (Signed)
VASCULAR LAB PRELIMINARY  PRELIMINARY  PRELIMINARY  PRELIMINARY  Bilateral lower extremity venous duplex completed.    Preliminary report:  Bilateral:  No evidence of DVT, superficial thrombosis, or Baker's Cyst.   Jantzen Pilger, RVS 06/21/2015, 12:19 PM

## 2015-06-22 MED ORDER — IBUPROFEN 600 MG PO TABS: 600.0000 mg | ORAL_TABLET | Freq: Four times a day (QID) | ORAL | Status: DC | PRN

## 2015-06-22 NOTE — Discharge Instructions (Signed)
NO SEX UNTIL AFTER YOU GET YOUR BIRTH CONTROL   Postpartum Care After Vaginal Delivery After you deliver your newborn (postpartum period), the usual stay in the hospital is 24-72 hours. If there were problems with your labor or delivery, or if you have other medical problems, you might be in the hospital longer.  While you are in the hospital, you will receive help and instructions on how to care for yourself and your newborn during the postpartum period.  While you are in the hospital:  Be sure to tell your nurses if you have pain or discomfort, as well as where you feel the pain and what makes the pain worse.  If you had an incision made near your vagina (episiotomy) or if you had some tearing during delivery, the nurses may put ice packs on your episiotomy or tear. The ice packs may help to reduce the pain and swelling.  If you are breastfeeding, you may feel uncomfortable contractions of your uterus for a couple of weeks. This is normal. The contractions help your uterus get back to normal size.  It is normal to have some bleeding after delivery.  For the first 1-3 days after delivery, the flow is red and the amount may be similar to a period.  It is common for the flow to start and stop.  In the first few days, you may pass some small clots. Let your nurses know if you begin to pass large clots or your flow increases.  Do not  flush blood clots down the toilet before having the nurse look at them.  During the next 3-10 days after delivery, your flow should become more watery and pink or brown-tinged in color.  Ten to fourteen days after delivery, your flow should be a small amount of yellowish-white discharge.  The amount of your flow will decrease over the first few weeks after delivery. Your flow may stop in 6-8 weeks. Most women have had their flow stop by 12 weeks after delivery.  You should change your sanitary pads frequently.  Wash your hands thoroughly with soap and water  for at least 20 seconds after changing pads, using the toilet, or before holding or feeding your newborn.  You should feel like you need to empty your bladder within the first 6-8 hours after delivery.  In case you become weak, lightheaded, or faint, call your nurse before you get out of bed for the first time and before you take a shower for the first time.  Within the first few days after delivery, your breasts may begin to feel tender and full. This is called engorgement. Breast tenderness usually goes away within 48-72 hours after engorgement occurs. You may also notice milk leaking from your breasts. If you are not breastfeeding, do not stimulate your breasts. Breast stimulation can make your breasts produce more milk.  Spending as much time as possible with your newborn is very important. During this time, you and your newborn can feel close and get to know each other. Having your newborn stay in your room (rooming in) will help to strengthen the bond with your newborn. It will give you time to get to know your newborn and become comfortable caring for your newborn.  Your hormones change after delivery. Sometimes the hormone changes can temporarily cause you to feel sad or tearful. These feelings should not last more than a few days. If these feelings last longer than that, you should talk to your caregiver.  If desired,  talk to your caregiver about methods of family planning or contraception.  Talk to your caregiver about immunizations. Your caregiver may want you to have the following immunizations before leaving the hospital:  Tetanus, diphtheria, and pertussis (Tdap) or tetanus and diphtheria (Td) immunization. It is very important that you and your family (including grandparents) or others caring for your newborn are up-to-date with the Tdap or Td immunizations. The Tdap or Td immunization can help protect your newborn from getting ill.  Rubella immunization.  Varicella (chickenpox)  immunization.  Influenza immunization. You should receive this annual immunization if you did not receive the immunization during your pregnancy.   This information is not intended to replace advice given to you by your health care provider. Make sure you discuss any questions you have with your health care provider.   Document Released: 06/06/2007 Document Revised: 05/03/2012 Document Reviewed: 04/05/2012 Elsevier Interactive Patient Education [employer].  Breastfeeding Deciding to breastfeed is one of the best choices you can make for you and your baby. A change in hormones during pregnancy causes your breast tissue to grow and increases the number and size of your milk ducts. These hormones also allow proteins, sugars, and fats from your blood supply to make breast milk in your milk-producing glands. Hormones prevent breast milk from being released before your baby is born as well as prompt milk flow after birth. Once breastfeeding has begun, thoughts of your baby, as well as his or her sucking or crying, can stimulate the release of milk from your milk-producing glands.  BENEFITS OF BREASTFEEDING For Your Baby  Your first milk (colostrum) helps your baby's digestive system function better.  There are antibodies in your milk that help your baby fight off infections.  Your baby has a lower incidence of asthma, allergies, and sudden infant death syndrome.  The nutrients in breast milk are better for your baby than infant formulas and are designed uniquely for your baby's needs.  Breast milk improves your baby's brain development.  Your baby is less likely to develop other conditions, such as childhood obesity, asthma, or type 2 diabetes mellitus. For You  Breastfeeding helps to create a very special bond between you and your baby.  Breastfeeding is convenient. Breast milk is always available at the correct temperature and costs nothing.  Breastfeeding helps to burn calories  and helps you lose the weight gained during pregnancy.  Breastfeeding makes your uterus contract to its prepregnancy size faster and slows bleeding (lochia) after you give birth.   Breastfeeding helps to lower your risk of developing type 2 diabetes mellitus, osteoporosis, and breast or ovarian cancer later in life. SIGNS THAT YOUR BABY IS HUNGRY Early Signs of Hunger  Increased alertness or activity.  Stretching.  Movement of the head from side to side.  Movement of the head and opening of the mouth when the corner of the mouth or cheek is stroked (rooting).  Increased sucking sounds, smacking lips, cooing, sighing, or squeaking.  Hand-to-mouth movements.  Increased sucking of fingers or hands. Late Signs of Hunger  Fussing.  Intermittent crying. Extreme Signs of Hunger Signs of extreme hunger will require calming and consoling before your baby will be able to breastfeed successfully. Do not wait for the following signs of extreme hunger to occur before you initiate breastfeeding:  Restlessness.  A loud, strong cry.  Screaming. BREASTFEEDING BASICS Breastfeeding Initiation  Find a comfortable place to sit or lie down, with your neck and back well supported.  Place  a pillow or rolled up blanket under your baby to bring him or her to the level of your breast (if you are seated). Nursing pillows are specially designed to help support your arms and your baby while you breastfeed.  Make sure that your baby's abdomen is facing your abdomen.  Gently massage your breast. With your fingertips, massage from your chest wall toward your nipple in a circular motion. This encourages milk flow. You may need to continue this action during the feeding if your milk flows slowly.  Support your breast with 4 fingers underneath and your thumb above your nipple. Make sure your fingers are well away from your nipple and your baby's mouth.  Stroke your baby's lips gently with your finger or  nipple.  When your baby's mouth is open wide enough, quickly bring your baby to your breast, placing your entire nipple and as much of the colored area around your nipple (areola) as possible into your baby's mouth.  More areola should be visible above your baby's upper lip than below the lower lip.  Your baby's tongue should be between his or her lower gum and your breast.  Ensure that your baby's mouth is correctly positioned around your nipple (latched). Your baby's lips should create a seal on your breast and be turned out (everted).  It is common for your baby to suck about 2-3 minutes in order to start the flow of breast milk. Latching Teaching your baby how to latch on to your breast properly is very important. An improper latch can cause nipple pain and decreased milk supply for you and poor weight gain in your baby. Also, if your baby is not latched onto your nipple properly, he or she may swallow some air during feeding. This can make your baby fussy. Burping your baby when you switch breasts during the feeding can help to get rid of the air. However, teaching your baby to latch on properly is still the best way to prevent fussiness from swallowing air while breastfeeding. Signs that your baby has successfully latched on to your nipple:  Silent tugging or silent sucking, without causing you pain.  Swallowing heard between every 3-4 sucks.  Muscle movement above and in front of his or her ears while sucking. Signs that your baby has not successfully latched on to nipple:  Sucking sounds or smacking sounds from your baby while breastfeeding.  Nipple pain. If you think your baby has not latched on correctly, slip your finger into the corner of your baby's mouth to break the suction and place it between your baby's gums. Attempt breastfeeding initiation again. Signs of Successful Breastfeeding Signs from your baby:  A gradual decrease in the number of sucks or complete cessation of  sucking.  Falling asleep.  Relaxation of his or her body.  Retention of a small amount of milk in his or her mouth.  Letting go of your breast by himself or herself. Signs from you:  Breasts that have increased in firmness, weight, and size 1-3 hours after feeding.  Breasts that are softer immediately after breastfeeding.  Increased milk volume, as well as a change in milk consistency and color by the fifth day of breastfeeding.  Nipples that are not sore, cracked, or bleeding. Signs That Your [employer] is Getting Enough Milk  Wetting at least 3 diapers in a 24-hour period. The urine should be clear and pale yellow by age 6 days.  At least 3 stools in a 24-hour period by age  5 days. The stool should be soft and yellow.  At least 3 stools in a 24-hour period by age 31 days. The stool should be seedy and yellow.  No loss of weight greater than 10% of birth weight during the first 64 days of age.  Average weight gain of 4-7 ounces (113-198 g) per week after age 2 days.  Consistent daily weight gain by age 30 days, without weight loss after the age of 2 weeks. After a feeding, your baby may spit up a small amount. This is common. BREASTFEEDING FREQUENCY AND DURATION Frequent feeding will help you make more milk and can prevent sore nipples and breast engorgement. Breastfeed when you feel the need to reduce the fullness of your breasts or when your baby shows signs of hunger. This is called "breastfeeding on demand." Avoid introducing a pacifier to your baby while you are working to establish breastfeeding (the first 4-6 weeks after your baby is born). After this time you may choose to use a pacifier. Research has shown that pacifier use during the first year of a baby's life decreases the risk of sudden infant death syndrome (SIDS). Allow your baby to feed on each breast as long as he or she wants. Breastfeed until your baby is finished feeding. When your baby unlatches or falls asleep while  feeding from the first breast, offer the second breast. Because newborns are often sleepy in the first few weeks of life, you may need to awaken your baby to get him or her to feed. Breastfeeding times will vary from baby to baby. However, the following rules can serve as a guide to help you ensure that your baby is properly fed:  Newborns (babies 38 weeks of age or younger) may breastfeed every 1-3 hours.  Newborns should not go longer than 3 hours during the day or 5 hours during the night without breastfeeding.  You should breastfeed your baby a minimum of 8 times in a 24-hour period until you begin to introduce solid foods to your baby at around 68 months of age. BREAST MILK PUMPING Pumping and storing breast milk allows you to ensure that your baby is exclusively fed your breast milk, even at times when you are unable to breastfeed. This is especially important if you are going back to work while you are still breastfeeding or when you are not able to be present during feedings. Your lactation consultant can give you guidelines on how long it is safe to store breast milk. A breast pump is a machine that allows you to pump milk from your breast into a sterile bottle. The pumped breast milk can then be stored in a refrigerator or freezer. Some breast pumps are operated by hand, while others use electricity. Ask your lactation consultant which type will work best for you. Breast pumps can be purchased, but some hospitals and breastfeeding support groups lease breast pumps on a monthly basis. A lactation consultant can teach you how to hand express breast milk, if you prefer not to use a pump. CARING FOR YOUR BREASTS WHILE YOU BREASTFEED Nipples can become dry, cracked, and sore while breastfeeding. The following recommendations can help keep your breasts moisturized and healthy:  Avoid using soap on your nipples.  Wear a supportive bra. Although not required, special nursing bras and tank tops are  designed to allow access to your breasts for breastfeeding without taking off your entire bra or top. Avoid wearing underwire-style bras or extremely tight bras.  Air dry  your nipples for 3-50mnutes after each feeding.  Use only cotton bra pads to absorb leaked breast milk. Leaking of breast milk between feedings is normal.  Use lanolin on your nipples after breastfeeding. Lanolin helps to maintain your skin's normal moisture barrier. If you use pure lanolin, you do not need to wash it off before feeding your baby again. Pure lanolin is not toxic to your baby. You may also hand express a few drops of breast milk and gently massage that milk into your nipples and allow the milk to air dry. In the first few weeks after giving birth, some women experience extremely full breasts (engorgement). Engorgement can make your breasts feel heavy, warm, and tender to the touch. Engorgement peaks within 3-5 days after you give birth. The following recommendations can help ease engorgement:  Completely empty your breasts while breastfeeding or pumping. You may want to start by applying warm, moist heat (in the shower or with warm water-soaked hand towels) just before feeding or pumping. This increases circulation and helps the milk flow. If your baby does not completely empty your breasts while breastfeeding, pump any extra milk after he or she is finished.  Wear a snug bra (nursing or regular) or tank top for 1-2 days to signal your body to slightly decrease milk production.  Apply ice packs to your breasts, unless this is too uncomfortable for you.  Make sure that your baby is latched on and positioned properly while breastfeeding. If engorgement persists after 48 hours of following these recommendations, contact your health care provider or a lScience writer OVERALL HEALTH CARE RECOMMENDATIONS WHILE BREASTFEEDING  Eat healthy foods. Alternate between meals and snacks, eating 3 of each per day. Because  what you eat affects your breast milk, some of the foods may make your baby more irritable than usual. Avoid eating these foods if you are sure that they are negatively affecting your baby.  Drink milk, fruit juice, and water to satisfy your thirst (about 10 glasses a day).  Rest often, relax, and continue to take your prenatal vitamins to prevent fatigue, stress, and anemia.  Continue breast self-awareness checks.  Avoid chewing and smoking tobacco. Chemicals from cigarettes that pass into breast milk and exposure to secondhand smoke may harm your baby.  Avoid alcohol and drug use, including marijuana. Some medicines that may be harmful to your baby can pass through breast milk. It is important to ask your health care provider before taking any medicine, including all over-the-counter and prescription medicine as well as vitamin and herbal supplements. It is possible to become pregnant while breastfeeding. If birth control is desired, ask your health care provider about options that will be safe for your baby. SEEK MEDICAL CARE IF:  You feel like you want to stop breastfeeding or have become frustrated with breastfeeding.  You have painful breasts or nipples.  Your nipples are cracked or bleeding.  Your breasts are red, tender, or warm.  You have a swollen area on either breast.  You have a fever or chills.  You have nausea or vomiting.  You have drainage other than breast milk from your nipples.  Your breasts do not become full before feedings by the fifth day after you give birth.  You feel sad and depressed.  Your baby is too sleepy to eat well.  Your baby is having trouble sleeping.   Your baby is wetting less than 3 diapers in a 24-hour period.  Your baby has less than 3 stools in  a 24-hour period.  Your baby's skin or the white part of his or her eyes becomes yellow.   Your baby is not gaining weight by 68 days of age. SEEK IMMEDIATE MEDICAL CARE IF:  Your baby  is overly tired (lethargic) and does not want to wake up and feed.  Your baby develops an unexplained fever.   This information is not intended to replace advice given to you by your health care provider. Make sure you discuss any questions you have with your health care provider.   Document Released: 08/09/2005 Document Revised: 04/30/2015 Document Reviewed: 01/31/2013 Elsevier Interactive Patient Education [employer].

## 2015-06-22 NOTE — Discharge Summary (Signed)
OB Discharge Summary     Patient Name: Linda Reid DOB: 1986/03/02 MRN: [id number]  Date of admission: 06/20/2015 Delivering MD: Keane Scrape   Date of discharge: 06/22/2015  Admitting diagnosis: labor Intrauterine pregnancy: [redacted]w[redacted]d     Secondary diagnosis:  Active Problems:   Active labor at term  Additional problems: none     Discharge diagnosis: Term Pregnancy Delivered                                                                                                Post partum procedures: neg venous doppler lower extremity d/t Rt loer extremity pain  Augmentation: AROM  Complications: None  Hospital course:  Onset of Labor With Vaginal Delivery     29 y.o. yo [id number] at [redacted]w[redacted]d was admitted in Active Laboron 06/20/2015. Patient had an uncomplicated labor course as follows:  Membrane Rupture Time/Date: 12:21 PM ,06/20/2015   Intrapartum Procedures: Episiotomy: None [1]                                         Lacerations:  Labial [10]  Patient had a delivery of a Viable infant. 06/20/2015  Information for the patient's newborn:  Mallisa, [place] [[id number]]  Delivery Method: Vaginal, Spontaneous Delivery (Filed from Delivery Summary)    Pateint had an uncomplicated postpartum course.  She is ambulating, tolerating a regular diet, passing flatus, and urinating well. Patient is discharged home in stable condition on No discharge date for patient encounter.Marland Kitchen    Physical exam  Filed Vitals:   06/20/15 2134 06/21/15 0626 06/21/15 1900 06/22/15 0520  BP: 103/51 105/55 112/59 92/45  Pulse: 63 79 88 71  Temp: 98.6 F (37 C) 98 F (36.7 C) 98.5 F (36.9 C) 98.2 F (36.8 C)  TempSrc: Oral Oral Oral Oral  Resp: 18 18 20 20   SpO2:   100%    General: alert, cooperative and no distress Lochia: appropriate Uterine Fundus: firm Incision: N/A DVT Evaluation: calves visually equal in size, no cords, neg Homan's- states pain in Rt shin is improving Labs: Lab  Results  Component Value Date   WBC 11.9* 06/20/2015   HGB 12.7 06/20/2015   HCT 37.6 06/20/2015   MCV 89.7 06/20/2015   PLT 209 06/20/2015   CMP Latest Ref Rng 01/04/2015  Glucose 65 - 99 mg/dL 88  BUN 6 - 20 mg/dL 12  Creatinine 0.44 - 1.00 mg/dL 0.61  Sodium 135 - 145 mmol/L 136  Potassium 3.5 - 5.1 mmol/L 3.4(L)  Chloride 101 - 111 mmol/L 107  CO2 22 - 32 mmol/L 23  Calcium 8.9 - 10.3 mg/dL 8.4(L)    Discharge instruction: per After Visit Summary and "Baby and Me Booklet".  Medications:  Current facility-administered medications:  .  acetaminophen (TYLENOL) tablet 650 mg, 650 mg, Oral, Q4H PRN, Keane Scrape, MD .  acetaminophen (TYLENOL) tablet 650 mg, 650 mg, Oral, Q4H PRN, Keane Scrape, MD .  benzocaine-Menthol (DERMOPLAST) 20-0.5 % topical spray 1 application, 1 application, Topical,  PRN, Keane Scrape, MD, 1 application at 34/19/37 1627 .  citric acid-sodium citrate (ORACIT) solution 30 mL, 30 mL, Oral, Q2H PRN, Keane Scrape, MD .  witch hazel-glycerin (TUCKS) pad 1 application, 1 application, Topical, PRN **AND** dibucaine (NUPERCAINAL) 1 % rectal ointment 1 application, 1 application, Rectal, PRN, Keane Scrape, MD .  diphenhydrAMINE (BENADRYL) capsule 25 mg, 25 mg, Oral, Q6H PRN, Keane Scrape, MD .  ibuprofen (ADVIL,MOTRIN) tablet 600 mg, 600 mg, Oral, 4 times per day, Keane Scrape, MD, 600 mg at 06/22/15 0542 .  lactated ringers infusion 500-1,000 mL, 500-1,000 mL, Intravenous, PRN, Keane Scrape, MD .  lactated ringers infusion, , Intravenous, Continuous, Keane Scrape, MD .  lanolin ointment, , Topical, PRN, Keane Scrape, MD .  lidocaine (PF) (XYLOCAINE) 1 % injection 30 mL, 30 mL, Subcutaneous, PRN, Keane Scrape, MD, 30 mL at 06/20/15 1325 .  ondansetron (ZOFRAN) injection 4 mg, 4 mg, Intravenous, Q6H PRN, Keane Scrape, MD .  ondansetron (ZOFRAN) tablet 4 mg, 4 mg, Oral, Q4H PRN **OR** ondansetron (ZOFRAN) injection 4 mg, 4  mg, Intravenous, Q4H PRN, Keane Scrape, MD .  oxyCODONE-acetaminophen (PERCOCET/ROXICET) 5-325 MG per tablet 1 tablet, 1 tablet, Oral, Q4H PRN, Keane Scrape, MD, 1 tablet at 06/21/15 2046 .  oxyCODONE-acetaminophen (PERCOCET/ROXICET) 5-325 MG per tablet 1 tablet, 1 tablet, Oral, Q4H PRN, Keane Scrape, MD, 1 tablet at 06/21/15 0523 .  oxyCODONE-acetaminophen (PERCOCET/ROXICET) 5-325 MG per tablet 2 tablet, 2 tablet, Oral, Q4H PRN, Keane Scrape, MD .  oxyCODONE-acetaminophen (PERCOCET/ROXICET) 5-325 MG per tablet 2 tablet, 2 tablet, Oral, Q4H PRN, Keane Scrape, MD .  oxytocin (PITOCIN) IV BOLUS FROM BAG, 500 mL, Intravenous, Continuous, Keane Scrape, MD, Last Rate: 999 mL/hr at 06/20/15 1322, 500 mL at 06/20/15 1322 .  oxytocin (PITOCIN) IV infusion 40 units in LR 1000 mL, 62.5 mL/hr, Intravenous, Continuous, Keane Scrape, MD, Last Rate: 62.5 mL/hr at 06/20/15 1345, 62.5 mL/hr at 06/20/15 1345 .  prenatal multivitamin tablet 1 tablet, 1 tablet, Oral, Q1200, Keane Scrape, MD, 1 tablet at 06/21/15 1142 .  senna-docusate (Senokot-S) tablet 2 tablet, 2 tablet, Oral, Q24H, Keane Scrape, MD, 2 tablet at 06/21/15 0022 .  simethicone (MYLICON) chewable tablet 80 mg, 80 mg, Oral, PRN, Keane Scrape, MD .  sodium phosphate (FLEET) 7-19 GM/118ML enema 1 enema, 1 enema, Rectal, PRN, Keane Scrape, MD .  zolpidem (AMBIEN) tablet 5 mg, 5 mg, Oral, QHS PRN, Keane Scrape, MD After visit meds:    Medication List    STOP taking these medications        acetaminophen 325 MG tablet  Commonly known as:  TYLENOL     calcium carbonate 500 MG chewable tablet  Commonly known as:  TUMS - dosed in mg elemental calcium     cetirizine 10 MG tablet  Commonly known as:  ZYRTEC     oxyCODONE-acetaminophen 5-325 MG tablet  Commonly known as:  PERCOCET/ROXICET      TAKE these medications        ibuprofen 600 MG tablet  Commonly known as:  ADVIL,MOTRIN  Take 1 tablet (600  mg total) by mouth every 6 (six) hours as needed for mild pain, moderate pain or cramping.     multivitamin-prenatal 27-0.8 MG Tabs tablet  Take 1 tablet by mouth daily at 12 noon.        Diet: routine diet  Activity: Advance as tolerated. Pelvic rest for 6 weeks.   Outpatient follow up:6 weeks Follow up Appt:Future Appointments Date Time Provider [place]  06/24/2015 9:15 AM Roma Schanz, CNM [facility]   Follow up Visit:No Follow-up on file.  Postpartum contraception: Nexplanon at [place], abstinence until  Newborn Data: Live born female  Birth Weight: 7 lb 13.8 oz (3565 g) APGAR: 8, 9  Baby Feeding: Breast Disposition:home with mother   06/22/2015 Tawnya Crook, CNM

## 2015-06-22 NOTE — Lactation Note (Signed)
This note was copied from the chart of Linda Cariann Kinnamon. Lactation Consultation Note  Patient Name: Linda Reid JOINO'M Date: 06/22/2015 Reason for consult: Follow-up assessment   With this mom of a term baby, now 61 hours old. Baby has had multiple stools and voids, mom denies any questions/discomfort, and knows to call lactation as needed after discharge   Maternal Data    Feeding    LATCH Score/Interventions                      Lactation Tools Discussed/Used     Consult Status Consult Status: Complete Follow-up type: Call as needed    Tonna Corner 06/22/2015, 8:42 AM

## 2015-06-24 ENCOUNTER — Encounter: Payer: Medicaid Other | Admitting: Women's Health

## 2015-07-02 ENCOUNTER — Telehealth: Payer: Self-pay | Admitting: Advanced Practice Midwife

## 2015-07-02 NOTE — Telephone Encounter (Signed)
Spoke with pt. Pt is having breast pain. Stopped nursing 4 days ago. Having sharp pains in her left breast. Breasts are red and warm to touch. Pt is wearing a sports bra that's to small and using cold cabbage leaves in bra. I advised to continue doing that. Also advised that it sounds like she may have mastitis and may need an antibiotic. Advised we need to see her tomorrow. Pt voiced understanding. Call transferred to front desk for appt. [place]

## 2015-07-03 ENCOUNTER — Encounter: Payer: Self-pay | Admitting: Adult Health

## 2015-07-03 ENCOUNTER — Ambulatory Visit (INDEPENDENT_AMBULATORY_CARE_PROVIDER_SITE_OTHER): Payer: Medicaid Other | Admitting: Adult Health

## 2015-07-03 VITALS — BP 118/88 | HR 72 | Ht 69.0 in | Wt 175.5 lb

## 2015-07-03 DIAGNOSIS — N61 Mastitis without abscess: Secondary | ICD-10-CM | POA: Insufficient documentation

## 2015-07-03 DIAGNOSIS — N6459 Other signs and symptoms in breast: Secondary | ICD-10-CM

## 2015-07-03 MED ORDER — HYDROCODONE-ACETAMINOPHEN 5-325 MG PO TABS: 1.0000 | ORAL_TABLET | Freq: Four times a day (QID) | ORAL | Status: DC | PRN

## 2015-07-03 MED ORDER — AMOXICILLIN-POT CLAVULANATE 875-125 MG PO TABS: 1.0000 | ORAL_TABLET | Freq: Two times a day (BID) | ORAL | Status: DC

## 2015-07-03 NOTE — Progress Notes (Signed)
Subjective:     Patient ID: Linda Reid, female   DOB: 1986/07/22, 29 y.o.   MRN: [id number]  HPI Linda Reid is a 29 year old black female who had a vaginal delivery 06/20/15 and was breastfeeding,but stopped about 4 days ago and breasts are hot and hurt.  Review of Systems See HPI for positives,all other systems negative Reviewed past medical,surgical, social and family history. Reviewed medications and allergies.     Objective:   Physical Exam BP 118/88 mmHg  Pulse 72  Ht 5\' 9"  (1.753 m)  Wt 175 lb 8 oz (79.606 kg)  BMI 25.90 kg/m2  LMP 09/19/2014 (Exact Date)  Breastfeeding? No    Skin warm and dry,  Breasts:no retraction,still has milk from nipples with pressure and has bilateral lumps but on left at about 7 o'clock has about 3 cm area of firmness and redness and both breasts feel warm.Face time 15 minutes with 50% counseling about breast care.    Assessment:     Mastitis  Breast engorgement     Plan:     Rx Augmentin 875-125 mg #20 take 1 bid x 10 days Rx norco 5-325 mg #30 take 1 every 6 hours prn pain no refills Use tight bra and cold cabbage leaves Do not let shower water hit breasts Can use ice 10 minutes every 30 minutes if needed Follow up about 11/21 or before if needed  Review handout on mastitis    Has postpartum visit for 08/04/15

## 2015-07-03 NOTE — Patient Instructions (Signed)
Wear tight bra Use cold cabbage leaves Use ice every 30 minutes for 10 minutes Take augmentin and follow up in 10 days Mastitis Mastitis is inflammation of the breast tissue. It occurs most often in women who are breastfeeding, but it can also affect other women, and even sometimes men. CAUSES  Mastitis is usually caused by a bacterial infection. Bacteria enter the breast tissue through cuts or openings in the skin. Typically, this occurs with breastfeeding because of cracked or irritated skin. Sometimes, it can occur even when there is no opening in the skin. It can be associated with plugged milk (lactiferous) ducts. Nipple piercing can also lead to mastitis. Also, some forms of breast cancer can cause mastitis. SIGNS AND SYMPTOMS   Swelling, redness, tenderness, and pain in an area of the breast.  Swelling of the glands under the arm on the same side.  Fever. If an infection is allowed to progress, a collection of pus (abscess) may develop. DIAGNOSIS  Your health care provider can usually diagnose mastitis based on your symptoms and a physical exam. Tests may be done to help confirm the diagnosis. These may include:   Removal of pus from the breast by applying pressure to the area. This pus can be examined in the lab to determine which bacteria are present. If an abscess has developed, the fluid in the abscess can be removed with a needle. This can also be used to confirm the diagnosis and determine the bacteria present. In most cases, pus will not be present.  Blood tests to determine if your body is fighting a bacterial infection.  Mammogram or ultrasound tests to rule out other problems or diseases. TREATMENT  Antibiotic medicine is used to treat a bacterial infection. Your health care provider will determine which bacteria are most likely causing the infection and will select an appropriate antibiotic. This is sometimes changed based on the results of tests performed to identify the  bacteria, or if there is no response to the antibiotic selected. Antibiotics are usually given by mouth. You may also be given medicine for pain. Mastitis that occurs with breastfeeding will sometimes go away on its own, so your health care provider may choose to wait 24 hours after first seeing you to decide whether a prescription medicine is needed. HOME CARE INSTRUCTIONS   Only take over-the-counter or prescription medicines for pain, fever, or discomfort as directed by your health care provider.  If your health care provider prescribed an antibiotic, take the medicine as directed. Make sure you finish it even if you start to feel better.  Do not wear a tight or underwire bra. Wear a soft, supportive bra.  Increase your fluid intake, especially if you have a fever.  Women who are breastfeeding should follow these instructions:  Continue to empty the breast. Your health care provider can tell you whether this milk is safe for your infant or needs to be thrown out. You may be told to stop nursing until your health care provider thinks it is safe for your baby. Use a breast pump if you are advised to stop nursing.  Keep your nipples clean and dry.  Empty the first breast completely before going to the other breast. If your baby is not emptying your breasts completely for some reason, use a breast pump to empty your breasts.  If you go back to work, pump your breasts while at work to stay in time with your nursing schedule.  Avoid allowing your breasts to become  overly filled with milk (engorged). SEEK MEDICAL CARE IF:   You have pus-like discharge from the breast.  Your symptoms do not improve with the treatment prescribed by your health care provider within 2 days. SEEK IMMEDIATE MEDICAL CARE IF:   Your pain and swelling are getting worse.  You have pain that is not controlled with medicine.  You have a red line extending from the breast toward your armpit.  You have a fever or  persistent symptoms for more than 2-3 days.  You have a fever and your symptoms suddenly get worse.   This information is not intended to replace advice given to you by your health care provider. Make sure you discuss any questions you have with your health care provider.   Document Released: 08/09/2005 Document Revised: 08/14/2013 Document Reviewed: 03/09/2013 Elsevier Interactive Patient Education [employer].

## 2015-07-14 ENCOUNTER — Encounter: Payer: Self-pay | Admitting: Adult Health

## 2015-07-14 ENCOUNTER — Ambulatory Visit (INDEPENDENT_AMBULATORY_CARE_PROVIDER_SITE_OTHER): Payer: Medicaid Other | Admitting: Adult Health

## 2015-07-14 VITALS — BP 110/80 | HR 66 | Ht 69.0 in | Wt 179.5 lb

## 2015-07-14 DIAGNOSIS — G8929 Other chronic pain: Secondary | ICD-10-CM | POA: Insufficient documentation

## 2015-07-14 DIAGNOSIS — R1031 Right lower quadrant pain: Secondary | ICD-10-CM

## 2015-07-14 DIAGNOSIS — R109 Unspecified abdominal pain: Secondary | ICD-10-CM | POA: Diagnosis not present

## 2015-07-14 DIAGNOSIS — Z87898 Personal history of other specified conditions: Secondary | ICD-10-CM

## 2015-07-14 NOTE — Patient Instructions (Signed)
Return 12/12 for post partum Try ice to groin,will recheck at follow up

## 2015-07-14 NOTE — Progress Notes (Addendum)
Subjective:     Patient ID: Linda Reid, female   DOB: May 17, 1986, 29 y.o.   MRN: [id number]  HPI Linda Reid is a 29 year old black female,back in follow up of mastitis and she says she is so much better,still has pain in right groin,has had since end of August,had [name] after delivery to R/O DVT.  Review of Systems Patient denies any headaches, hearing loss, fatigue, blurred vision, shortness of breath, chest pain, abdominal pain, problems with bowel movements, urination, or intercourse(not yet). No joint pain or mood swings.See HPI for positives. Reviewed past medical,surgical, social and family history. Reviewed medications and allergies.     Objective:   Physical Exam BP 110/80 mmHg  Pulse 66  Ht 5\' 9"  (1.753 m)  Wt 179 lb 8 oz (81.421 kg)  BMI 26.50 kg/m2  Breastfeeding? No  Skin warm and dry,  Breasts:no dominate palpable mass, retraction or nipple discharge,does have milk from nipples when pressed,has tenderness in right groin and ?small swollen lymph node,face time 15 minutes with 50% counseling.Try ice to groin,decliens meds will recheck at postpartum visit.Encourgaed that milk should dry up,wear tight bra.    Assessment:     History if mastitis Chronic pain right groin     Plan:    Wear tight bra Try some ice to groin Return 12/12 for postpartum

## 2015-08-04 ENCOUNTER — Ambulatory Visit: Payer: Medicaid Other | Admitting: Women's Health

## 2015-08-06 ENCOUNTER — Ambulatory Visit (INDEPENDENT_AMBULATORY_CARE_PROVIDER_SITE_OTHER): Payer: Medicaid Other | Admitting: Women's Health

## 2015-08-06 ENCOUNTER — Encounter: Payer: Self-pay | Admitting: Women's Health

## 2015-08-06 DIAGNOSIS — F53 Postpartum depression: Secondary | ICD-10-CM | POA: Insufficient documentation

## 2015-08-06 DIAGNOSIS — O99345 Other mental disorders complicating the puerperium: Secondary | ICD-10-CM

## 2015-08-06 DIAGNOSIS — R1031 Right lower quadrant pain: Secondary | ICD-10-CM

## 2015-08-06 MED ORDER — ESCITALOPRAM OXALATE 10 MG PO TABS: 10.0000 mg | ORAL_TABLET | Freq: Every day | ORAL | Status: DC

## 2015-08-06 NOTE — Patient Instructions (Signed)
Try ibuprofen/motrin  If you don't hear from Dr. Ruthe Mannan office within the next week, please let us know

## 2015-08-06 NOTE — Progress Notes (Signed)
Patient ID: Linda Reid, female   DOB: 1985/10/07, 29 y.o.   MRN: [id number] Subjective:    Linda Reid is a 29 y.o. [id number] African American female who presents for a postpartum visit. She is 6 weeks postpartum following a spontaneous vaginal delivery at 38.2 gestational weeks. Anesthesia: none. I have fully reviewed the prenatal and intrapartum course. Postpartum course has been complicated by Rt inner thigh/groin pain that began in pregnancy but has continued to worsen since having baby. Wakes her up during sleep, hurts to move her leg at all in any direction. Has tried ice/heat, apap w/o relief. Is affecting her daily routines.  Baby's course has been uncomplicated. Baby is feeding by bottle. Pt had mastitis a couple of weeks after delivery.  Bleeding no bleeding. Bowel function is normal. Bladder function is normal. Patient is not sexually active. Last sexual activity: prior to birth of baby. Contraception method is abstinence and wants nexplanon. Postpartum depression screening: negative. Score 8, however when asked pt states she just cries all the time, doesn't find joy in things she used to, decreased appetite, not sleeping well, states 'it's not from baby, but from stuff going on w/ fob'. Denies SI/HI/II. Exercise/sports are her stress relief and she hasn't been able to d/t Rt groin pain. Discussed counseling/meds- pt thinks she needs meds at this time.  Last pap 2015 in [place] and was normal.  The following portions of the patient's history were reviewed and updated as appropriate: allergies, current medications, past medical history, past surgical history and problem list.  Review of Systems Pertinent items are noted in HPI.   Filed Vitals:   08/06/15 1050  BP: 104/68  Pulse: 64  Weight: 175 lb (79.379 kg)   Patient's last menstrual period was 08/03/2015.  Objective:   General:  alert, cooperative and no distress   Breasts:  deferred, no complaints  Lungs: clear to  auscultation bilaterally  Heart:  regular rate and rhythm  Abdomen: soft, nontender   Vulva: normal  Vagina: normal vagina  Cervix:  closed  Corpus: Well-involuted  Adnexa:  Non-palpable  Rectal Exam: No hemorrhoids        Assessment:   Postpartum exam 6 wks s/p SVB Bottlefeeding Depression screening PPD/situational depression Rt groin/inner thigh pain, worsening Contraception counseling   Plan:  Try ibuprofen/motrin for pain, discussed w/ JVF, will refer to Dr. Earmon [place] Contraception: abstinence until nexplanon  Rx lexapro 10mg  daily, knows can take 3-4wks to notice difference Follow up in: asap  for nexplanon insertion, then 4wks for med f/u, or earlier if needed  Tawnya Crook CNM, [facility] 08/06/2015 11:06 AM

## 2015-08-06 NOTE — Addendum Note (Signed)
Addended by: Traci Sermon A on: 08/06/2015 02:38 PM   Modules accepted: Orders

## 2015-08-11 ENCOUNTER — Encounter: Payer: Medicaid Other | Admitting: Obstetrics and Gynecology

## 2015-08-13 ENCOUNTER — Encounter: Payer: Medicaid Other | Admitting: Women's Health

## 2015-08-26 ENCOUNTER — Encounter: Payer: Self-pay | Admitting: Women's Health

## 2015-08-26 ENCOUNTER — Ambulatory Visit (INDEPENDENT_AMBULATORY_CARE_PROVIDER_SITE_OTHER): Payer: Medicaid Other | Admitting: Women's Health

## 2015-08-26 VITALS — BP 98/62 | HR 96 | Wt 176.0 lb

## 2015-08-26 DIAGNOSIS — Z8751 Personal history of pre-term labor: Secondary | ICD-10-CM | POA: Insufficient documentation

## 2015-08-26 DIAGNOSIS — Z30017 Encounter for initial prescription of implantable subdermal contraceptive: Secondary | ICD-10-CM | POA: Diagnosis not present

## 2015-08-26 DIAGNOSIS — Z3202 Encounter for pregnancy test, result negative: Secondary | ICD-10-CM | POA: Diagnosis not present

## 2015-08-26 DIAGNOSIS — Z8619 Personal history of other infectious and parasitic diseases: Secondary | ICD-10-CM | POA: Insufficient documentation

## 2015-08-26 LAB — POCT URINE PREGNANCY: Preg Test, Ur: NEGATIVE

## 2015-08-26 NOTE — Patient Instructions (Signed)

## 2015-08-26 NOTE — Progress Notes (Signed)
Patient ID: Linda Reid, female   DOB: [birth year], 30 y.o.   MRN: [id number] Linda Reid is a 30 y.o. year old African American female here for Nexplanon insertion.  Patient's last menstrual period was 08/03/2015., last sexual intercourse was prior to birth of baby, and her pregnancy test today was negative.  Risks/benefits/side effects of Nexplanon have been discussed and her questions have been answered.  Specifically, a failure rate of 08/998 has been reported, with an increased failure rate if pt takes Brecon and/or antiseizure medicaitons.  Linda Reid is aware of the common side effect of irregular bleeding, which the incidence of decreases over time. She was placed on lexapro 10mg  po daily at pp visit d/t ppd/situational depression, states she did start medication, but didn't take very long d/t circumstances/stressors improving, feels better now.  Still has Rt groin pain, feels like is worsening. Has appt w/ Dr. Aline Brochure on [place].    BP 98/62 mmHg  Pulse 96  Wt 176 lb (79.833 kg)  LMP 08/03/2015  Results for orders placed or performed in visit on 08/26/15 (from the past 24 hour(s))  POCT urine pregnancy   Collection Time: 08/26/15  2:21 PM  Result Value Ref Range   Preg Test, Ur Negative Negative     She is right-handed, so her left arm, approximately 4 inches proximal from the elbow, was cleansed with alcohol and anesthetized with 2cc of 2% Lidocaine.  The area was cleansed again with betadine and the Nexplanon was inserted per manufacturer's recommendations without difficulty.  A steri-strip and pressure bandage were applied.  Pt was instructed to keep the area clean and dry, remove pressure bandage in 24 hours, and keep insertion site covered with the steri-strip for 3-5 days.  Back up contraception was recommended for 2 weeks.  She was given a card indicating date Nexplanon was inserted and date it needs to be removed. Follow-up PRN problems.  Tawnya Crook CNM, [facility] 08/26/2015 2:38 PM

## 2015-08-28 ENCOUNTER — Encounter: Payer: Self-pay | Admitting: Orthopedic Surgery

## 2015-08-28 ENCOUNTER — Ambulatory Visit (INDEPENDENT_AMBULATORY_CARE_PROVIDER_SITE_OTHER): Payer: Medicaid Other

## 2015-08-28 ENCOUNTER — Ambulatory Visit (INDEPENDENT_AMBULATORY_CARE_PROVIDER_SITE_OTHER): Payer: Medicaid Other | Admitting: Orthopedic Surgery

## 2015-08-28 VITALS — BP 120/83 | Ht 69.0 in | Wt 176.0 lb

## 2015-08-28 DIAGNOSIS — M7611 Psoas tendinitis, right hip: Secondary | ICD-10-CM

## 2015-08-28 DIAGNOSIS — M545 Low back pain, unspecified: Secondary | ICD-10-CM

## 2015-08-28 DIAGNOSIS — M169 Osteoarthritis of hip, unspecified: Secondary | ICD-10-CM | POA: Diagnosis not present

## 2015-08-28 DIAGNOSIS — R1031 Right lower quadrant pain: Secondary | ICD-10-CM

## 2015-08-28 DIAGNOSIS — M24159 Other articular cartilage disorders, unspecified hip: Secondary | ICD-10-CM

## 2015-08-28 MED ORDER — IBUPROFEN 800 MG PO TABS: 800.0000 mg | ORAL_TABLET | Freq: Three times a day (TID) | ORAL | Status: DC

## 2015-08-28 MED ORDER — METHOCARBAMOL 500 MG PO TABS: 500.0000 mg | ORAL_TABLET | Freq: Three times a day (TID) | ORAL | Status: DC

## 2015-08-28 NOTE — Patient Instructions (Signed)
Two medications sent to your pharmacy  Call [facility] therapy to schedule therapy visits

## 2015-08-28 NOTE — Progress Notes (Signed)
Patient ID: Linda Reid, female   DOB: [birth year], 30 y.o.   MRN: [id number]  Chief Complaint  Patient presents with  . Hip Pain    Right hip pain x 5 mnths, no known injury, referred by Dr Glo Herring    HPI Linda Reid is a 30 y.o. female.  Comes in for evaluation of 5 months of right hip pain and lower back pain since her delivery.  Symptoms pain swelling locking stiffness giving way tingling. Description sharp throbbing stabbing aching pain groin and groin pain increases then the back pain increases. Constant pain 7 out of 10 unrelieved by Tylenol aggravated by long periods of standing or lying down not walking.  Review of Systems Review of Systems  Fatigue ankle leg swelling and tingling in the legs weakness joint limb pain muscle weakness stiff joints swollen joints back pain all other systems including bowel bladder function normal and no constitutional symptoms   Past Medical History  Diagnosis Date  . Preterm labor   . Mastitis 07/03/2015  . Breast engorgement 07/03/2015  . History of mastitis 07/14/2015  . Groin pain, chronic, right 07/14/2015    Has pain right groin area since about end of August,?swollen lymph node noted today,she said had [name] after baby born to R/O DVT     Past Surgical History  Procedure Laterality Date  . Tonsillectomy      Family History  Problem Relation Age of Onset  . Diabetes Maternal Aunt   . Hypertension Maternal Aunt   . Stroke Maternal Aunt   . Diabetes Maternal Uncle   . Hypertension Maternal Uncle   . Hypertension Paternal Aunt   . Hypertension Paternal Uncle   . Cancer Maternal Grandmother     breast  . Diabetes Maternal Grandmother   . Hypertension Maternal Grandmother   . Heart disease Maternal Grandfather   . Hypertension Maternal Grandfather   . Hypertension Paternal Grandmother   . Hypertension Paternal Grandfather   . Premature birth Daughter   . Asthma Daughter     Social History Social History   Substance Use Topics  . Smoking status: Never Smoker   . Smokeless tobacco: Never Used  . Alcohol Use: No    No Known Allergies  Current Outpatient Prescriptions  Medication Sig Dispense Refill  . cetirizine (ZYRTEC) 10 MG tablet Take 10 mg by mouth as needed for allergies. Reported on 08/26/2015     No current facility-administered medications for this visit.       Physical Exam Physical Exam Blood pressure 120/83, height 5\' 9"  (1.753 m), weight 176 lb (79.833 kg), last menstrual period 08/03/2015, not currently breastfeeding. Appearance, there are no abnormalities in terms of appearance the patient was well-developed and well-nourished. The grooming and hygiene were normal.  Mental status orientation, there was normal alertness and orientation Mood pleasant Ambulatory status normal with no assistive devices  Examination of the lumbar spine reveal tenderness at the L4-5 disc lateral right lower back  Decreased spinal flexion extension and rotation were noted.  The lower extremities exhibited normal dorsiflexion plantar flexion extension flexion of the knee as well as hip flexion.   However she did have pain with flexion adduction internal rotation on right negative on the left  No atrophy was noted. The skin overlying the lumbar thoracic and cervical regions was warm dry and intact without laceration. The lower extremities also had normal skin.  Neurologic examination  Reflexes were 2+ and equal  Sensation was normal in both feet and  legs  Babinski's tests were down going  Straight leg raise testing was normal bilaterally  The vascular examination revealed normal dorsalis pedis pulses in both feet and both feet were warm with good capillary refill   Data Reviewed Plain films hip show decreased femoral offset consistent with acetabular impingement primarily on the head side no arthritic changes otherwise in the joint  Assessment  Lower back pain Labral tear with  acetabular impingement   Plan  Physical therapy back and hip for nonspecific back pain and therapy for labral tear  Robaxin and ibuprofen for pain  7 week follow-up Radiology report 2 views right hip  The right groin pain  The femoral neck angle appears to be slightly more valgus. No problems with the femoral head  We do see decreased femoral head offset on the lateral x-ray with a cam type acetabular impingement only scenario  Clinical correlation needed for further diagnostic information and may need MRI with arthrogram to assess the labrum

## 2015-09-02 ENCOUNTER — Telehealth: Payer: Self-pay | Admitting: *Deleted

## 2015-09-02 NOTE — Telephone Encounter (Signed)
Routing to Dr Harrison 

## 2015-09-02 NOTE — Telephone Encounter (Signed)
Stop the robaxin all MR s make you sleepy   Take aleve instead of IB   Aleve i bid  ( no guarantees)

## 2015-09-02 NOTE — Telephone Encounter (Signed)
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 Please advise a different medication for patient to take. [phone]

## 2015-09-03 ENCOUNTER — Ambulatory Visit: Payer: Medicaid Other | Admitting: Women's Health

## 2015-09-03 NOTE — Telephone Encounter (Signed)
Routing to Jaime  

## 2015-09-04 NOTE — Telephone Encounter (Signed)
PATIENT AWARE

## 2015-09-08 ENCOUNTER — Emergency Department (HOSPITAL_COMMUNITY)
Admission: EM | Admit: 2015-09-08 | Discharge: 2015-09-08 | Disposition: A | Discharge status: Home / Self Care | Payer: Medicaid Other | Attending: Emergency Medicine | Admitting: Emergency Medicine

## 2015-09-08 ENCOUNTER — Encounter (HOSPITAL_COMMUNITY): Payer: Self-pay | Admitting: Emergency Medicine

## 2015-09-08 ENCOUNTER — Ambulatory Visit: Payer: Medicaid Other | Admitting: Women's Health

## 2015-09-08 ENCOUNTER — Encounter: Payer: Self-pay | Admitting: Women's Health

## 2015-09-08 DIAGNOSIS — G8929 Other chronic pain: Secondary | ICD-10-CM | POA: Diagnosis not present

## 2015-09-08 DIAGNOSIS — Z791 Long term (current) use of non-steroidal anti-inflammatories (NSAID): Secondary | ICD-10-CM | POA: Diagnosis not present

## 2015-09-08 DIAGNOSIS — M25851 Other specified joint disorders, right hip: Secondary | ICD-10-CM | POA: Diagnosis not present

## 2015-09-08 DIAGNOSIS — Z8751 Personal history of pre-term labor: Secondary | ICD-10-CM | POA: Diagnosis not present

## 2015-09-08 DIAGNOSIS — Z79899 Other long term (current) drug therapy: Secondary | ICD-10-CM | POA: Insufficient documentation

## 2015-09-08 DIAGNOSIS — M7611 Psoas tendinitis, right hip: Secondary | ICD-10-CM

## 2015-09-08 DIAGNOSIS — M5441 Lumbago with sciatica, right side: Secondary | ICD-10-CM | POA: Diagnosis not present

## 2015-09-08 DIAGNOSIS — Z8742 Personal history of other diseases of the female genital tract: Secondary | ICD-10-CM | POA: Insufficient documentation

## 2015-09-08 DIAGNOSIS — M549 Dorsalgia, unspecified: Secondary | ICD-10-CM | POA: Diagnosis present

## 2015-09-08 MED ORDER — HYDROCODONE-ACETAMINOPHEN 5-325 MG PO TABS
2.0000 | ORAL_TABLET | Freq: Once | ORAL | Status: AC
  Administered 2015-09-08: 2 via ORAL
  Filled 2015-09-08: qty 2

## 2015-09-08 MED ORDER — ONDANSETRON HCL 4 MG PO TABS
4.0000 mg | ORAL_TABLET | Freq: Once | ORAL | Status: AC
  Administered 2015-09-08: 4 mg via ORAL
  Filled 2015-09-08: qty 1

## 2015-09-08 MED ORDER — KETOROLAC TROMETHAMINE 10 MG PO TABS
10.0000 mg | ORAL_TABLET | Freq: Once | ORAL | Status: AC
  Administered 2015-09-08: 10 mg via ORAL
  Filled 2015-09-08: qty 1

## 2015-09-08 MED ORDER — DIAZEPAM 5 MG PO TABS
5.0000 mg | ORAL_TABLET | Freq: Once | ORAL | Status: AC
  Administered 2015-09-08: 5 mg via ORAL
  Filled 2015-09-08: qty 1

## 2015-09-08 MED ORDER — DICLOFENAC SODIUM 75 MG PO TBEC: 75.0000 mg | DELAYED_RELEASE_TABLET | Freq: Two times a day (BID) | ORAL | Status: DC

## 2015-09-08 NOTE — ED Notes (Signed)
Patient complaining of back pain "since august but I was pregnant then and they told me it would go away after I had the baby, but it got worse instead." Patient states she went to Dr Aline Brochure on Jan 5 and was told "I have abnormalities in my back and right hip." States prescriptions for robaxin and ibuprofen are not helping.

## 2015-09-08 NOTE — ED Provider Notes (Signed)
CSN: [id number]     Arrival date & time 09/08/15  2047 History   First MD Initiated Contact with Patient 09/08/15 2206     Chief Complaint  Patient presents with  . Back Pain     (Consider location/radiation/quality/duration/timing/severity/associated sxs/prior Treatment) HPI Comments: Pt is a 30 y/o female who present to ED with c/o back and hip pain. Pt was evaluated on Aug 28, 2015 and found to have "abnormalities of the right hip". She has had problems with back pain since recently being pregnant with her baby. The back and hip pain were slow to resolve and she was seen by orthopedics for this problem. She was prescribed robaxin, and ibuprofen and  physical therapy, but these are not helping. Pt states she can not complete work and activities around the house due to pain. No falls, or difficulty using lower extremities. No loss of bowel or bladder function.  The history is provided by the patient.    Past Medical History  Diagnosis Date  . Preterm labor   . Mastitis 07/03/2015  . Breast engorgement 07/03/2015  . History of mastitis 07/14/2015  . Groin pain, chronic, right 07/14/2015    Has pain right groin area since about end of August,?swollen lymph node noted today,she said had [name] after baby born to R/O DVT    Past Surgical History  Procedure Laterality Date  . Tonsillectomy     Family History  Problem Relation Age of Onset  . Diabetes Maternal Aunt   . Hypertension Maternal Aunt   . Stroke Maternal Aunt   . Diabetes Maternal Uncle   . Hypertension Maternal Uncle   . Hypertension Paternal Aunt   . Hypertension Paternal Uncle   . Cancer Maternal Grandmother     breast  . Diabetes Maternal Grandmother   . Hypertension Maternal Grandmother   . Heart disease Maternal Grandfather   . Hypertension Maternal Grandfather   . Hypertension Paternal Grandmother   . Hypertension Paternal Grandfather   . Premature birth Daughter   . Asthma Daughter    Social History   Substance Use Topics  . Smoking status: Never Smoker   . Smokeless tobacco: Never Used  . Alcohol Use: No   OB History    Gravida Para Term Preterm AB TAB SAB Ectopic Multiple Living   6 4 1 3 2  0 2 0 0 4     Review of Systems  Musculoskeletal: Positive for back pain and arthralgias.  All other systems reviewed and are negative.     Allergies  Review of patient's allergies indicates no known allergies.  Home Medications   Prior to Admission medications   Medication Sig Start Date End Date Taking? Authorizing Provider  cetirizine (ZYRTEC) 10 MG tablet Take 10 mg by mouth as needed for allergies. Reported on 08/26/2015    Historical Provider, MD  ibuprofen (ADVIL,MOTRIN) 800 MG tablet Take 1 tablet (800 mg total) by mouth 3 (three) times daily. 08/28/15   Carole Civil, MD  methocarbamol (ROBAXIN) 500 MG tablet Take 1 tablet (500 mg total) by mouth 3 (three) times daily. 08/28/15   Carole Civil, MD   BP 120/83 mmHg  Pulse 71  Temp(Src) 98.7 F (37.1 C) (Oral)  Resp 20  Ht 5\' 9"  (1.753 m)  Wt 79.379 kg  BMI 25.83 kg/m2  SpO2 100%  LMP 08/10/2015 Physical Exam  Constitutional: She is oriented to person, place, and time. She appears well-developed and well-nourished.  Non-toxic appearance.  HENT:  Head:  Normocephalic.  Right Ear: Tympanic membrane and external ear normal.  Left Ear: Tympanic membrane and external ear normal.  Eyes: EOM and lids are normal. Pupils are equal, round, and reactive to light.  Neck: Normal range of motion. Neck supple. Carotid bruit is not present.  Cardiovascular: Normal rate, regular rhythm, normal heart sounds, intact distal pulses and normal pulses.   Pulmonary/Chest: Breath sounds normal. No respiratory distress.  Abdominal: Soft. Bowel sounds are normal. There is no tenderness. There is no guarding.  Musculoskeletal:       Right hip: She exhibits decreased range of motion and tenderness. She exhibits no deformity.       Lumbar  back: She exhibits pain. She exhibits no deformity.  Lymphadenopathy:       Head (right side): No submandibular adenopathy present.       Head (left side): No submandibular adenopathy present.    She has no cervical adenopathy.  Neurological: She is alert and oriented to person, place, and time. She has normal strength. No cranial nerve deficit or sensory deficit. She exhibits normal muscle tone. Coordination normal.  Skin: Skin is warm and dry.  Psychiatric: She has a normal mood and affect. Her speech is normal.  Nursing note and vitals reviewed.   ED Course  Procedures (including critical care time) Labs Review Labs Reviewed - No data to display  Imaging Review No results found. I have personally reviewed and evaluated these images and lab results as part of my medical decision-making.   EKG Interpretation None      MDM I have reviewed pt's xrays and records from orthopedics. Pt has femoral acetabular impingment on the right that is impacting right low back pain. No gross neuro deficit. Pt treated in the ED with muscle relaxer and pain medication. Rx for diclofenac given to the patient to add to current medications. Strongly suggested discussing this with Dr Aline Brochure for additonal management and direction on therapy and work.   Final diagnoses:  Femoral acetabular impingement, right  Right-sided low back pain with right-sided sciatica    **I have reviewed nursing notes, vital signs, and all appropriate lab and imaging results for this patient.Lily Kocher, PA-C 09/10/15 1117  Noemi Chapel, MD 09/13/15 [phone]

## 2015-09-08 NOTE — Discharge Instructions (Signed)
Dr Aline Brochure has determined that you have Femoral Acetabular Impingement on the right. Your exam tonight reveals back pain, and questions sciatica. Heating pad to your lower back maybe helpful. Please rest your back and hip is much as possible. Continue your Robaxin. Use diclofenac 2 times daily with food. May use Tylenol in between the echo that doses if needed. Please discuss your breakthrough pain, and pain increased with standing at work with Dr. Aline Brochure as soon as possible. Heat Therapy Heat therapy can help ease sore, stiff, injured, and tight muscles and joints. Heat relaxes your muscles, which may help ease your pain. Heat therapy should only be used on old, pre-existing, or long-lasting (chronic) injuries. Do not use heat therapy unless told by your doctor. HOW TO USE HEAT THERAPY There are several different kinds of heat therapy, including:  Moist heat pack.  Warm water bath.  Hot water bottle.  Electric heating pad.  Heated gel pack.  Heated wrap.  Electric heating pad. GENERAL HEAT THERAPY RECOMMENDATIONS   Do not sleep while using heat therapy. Only use heat therapy while you are awake.  Your skin may turn pink while using heat therapy. Do not use heat therapy if your skin turns red.  Do not use heat therapy if you have new pain.  High heat or long exposure to heat can cause burns. Be careful when using heat therapy to avoid burning your skin.  Do not use heat therapy on areas of your skin that are already irritated, such as with a rash or sunburn. GET HELP IF:   You have blisters, redness, swelling (puffiness), or numbness.  You have new pain.  Your pain is worse. MAKE SURE YOU:  Understand these instructions.  Will watch your condition.  Will get help right away if you are not doing well or get worse.   This information is not intended to replace advice given to you by your health care provider. Make sure you discuss any questions you have with your health  care provider.   Document Released: 11/01/2011 Document Revised: 08/30/2014 Document Reviewed: 10/02/2013 Elsevier Interactive Patient Education [employer].

## 2015-09-10 ENCOUNTER — Ambulatory Visit (HOSPITAL_COMMUNITY): Payer: Medicaid Other | Attending: Orthopedic Surgery | Admitting: Physical Therapy

## 2015-09-10 DIAGNOSIS — M2560 Stiffness of unspecified joint, not elsewhere classified: Secondary | ICD-10-CM | POA: Diagnosis present

## 2015-09-10 DIAGNOSIS — R269 Unspecified abnormalities of gait and mobility: Secondary | ICD-10-CM

## 2015-09-10 DIAGNOSIS — M791 Myalgia, unspecified site: Secondary | ICD-10-CM

## 2015-09-10 DIAGNOSIS — M25551 Pain in right hip: Secondary | ICD-10-CM

## 2015-09-10 DIAGNOSIS — M169 Osteoarthritis of hip, unspecified: Secondary | ICD-10-CM | POA: Insufficient documentation

## 2015-09-10 DIAGNOSIS — M24159 Other articular cartilage disorders, unspecified hip: Secondary | ICD-10-CM

## 2015-09-10 DIAGNOSIS — M6281 Muscle weakness (generalized): Secondary | ICD-10-CM | POA: Diagnosis present

## 2015-09-10 DIAGNOSIS — M2569 Stiffness of other specified joint, not elsewhere classified: Secondary | ICD-10-CM

## 2015-09-10 DIAGNOSIS — M545 Low back pain: Secondary | ICD-10-CM | POA: Diagnosis present

## 2015-09-10 DIAGNOSIS — M25651 Stiffness of right hip, not elsewhere classified: Secondary | ICD-10-CM | POA: Insufficient documentation

## 2015-09-10 NOTE — Therapy (Signed)
[place], [place], [address] Phone: [phone]   Fax:  [phone]  Physical Therapy Evaluation  Patient Details  Name: Linda Reid MRN: [id number] Date of Birth: February 18, 1986 Referring Provider: Dr. Aline Brochure   Encounter Date: 09/10/2015      PT End of Session - 09/10/15 1113    Visit Number 1   Number of Visits 14   Date for PT Re-Evaluation 10/08/15   Authorization Type Medicaid (however she rpeorts she is in process of being added to her husband's insurance)   Authorization Time Period 09/10/15 to 11/08/15   PT Start Time 0932   PT Stop Time 1023   PT Time Calculation (min) 51 min   Activity Tolerance Patient limited by pain   Behavior During Therapy [facility] for tasks assessed/performed      Past Medical History  Diagnosis Date  . Preterm labor   . Mastitis 07/03/2015  . Breast engorgement 07/03/2015  . History of mastitis 07/14/2015  . Groin pain, chronic, right 07/14/2015    Has pain right groin area since about end of August,?swollen lymph node noted today,she said had [name] after baby born to R/O DVT     Past Surgical History  Procedure Laterality Date  . Tonsillectomy      There were no vitals filed for this visit.  Visit Diagnosis:  Labral tear of hip, degenerative - Plan: PT plan of care cert/re-cert  Low back pain without sciatica, unspecified back pain laterality - Plan: PT plan of care cert/re-cert  Hip pain, right - Plan: PT plan of care cert/re-cert  Muscle tenderness - Plan: PT plan of care cert/re-cert  Muscle weakness - Plan: PT plan of care cert/re-cert  Abnormality of gait - Plan: PT plan of care cert/re-cert  Back stiffness - Plan: PT plan of care cert/re-cert  Hip stiffness, right - Plan: PT plan of care cert/re-cert      Subjective Assessment - 09/10/15 0935    Subjective Real sore typically. Varies as to which hurts worse- the past 3 days her back has been bothering  her more but it goes back and forth. Hard to do day to day things. 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 Medicines make her sleepy which makes it hard to function. Also has numbness/tingling when laying down and sitting, has a hard time getting comfortable.     Pertinent History Patient reports that she just recently had a baby; her pain all started around August when she was about 6 months pregnant and she started getting bad groin/hip pain which kept getting worse. OB referred to Dr. Aline Brochure. They initially thought that baby might have been positioned on nerve but pain kept getting worse even after delivery. When she went to [facility] in December she told them pain is still ongoing (birth was in October). Reports that when she went to ER, the MD there told her this may be two separate issues.    How long can you sit comfortably? 20-30 minutes but R LE tingles all the way down the front all the way down to toes    How long can you stand comfortably? 10-15 minutes    How long can you walk comfortably? 10-15 minutes    Diagnostic tests providers thinking about MRI but it has nto been done yet    Patient Stated Goals reduce pain to assist to getting back to PLOF  Currently in Pain? Yes   Pain Score 6    Pain Location Back            [facility] PT Assessment - 09/10/15 0001    Assessment   Medical Diagnosis labral tear of hip, LBP    Referring Provider Dr. Aline Brochure    Onset Date/Surgical Date --  late summer/fall 2016   Next MD Visit V[facility] Berger the 28th with Dr. Aline Brochure    Precautions   Precautions None   Restrictions   Weight Bearing Restrictions No   Balance Screen   Has the patient fallen in the past 6 months No   Has the patient had a decrease in activity level because of a fear of falling?  No   Is the patient reluctant to leave their home because of a fear of falling?  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    Leisure volleyball    Observation/Other Assessments   Observations positive signs FABER, FADER, grind   Posture/Postural Control   Posture Comments reduced spinal curves with very flat T spine, R shoulder Lower than L, slight fleexion at hiops    AROM   Overall AROM Comments severe limitation all hip motions secondary to pain (passive motions in supine(; no significant LLD noted    Right Hip External Rotation  --  wfl, pain    Right Hip Internal Rotation  23  pain    Left Hip External Rotation  --  wfl    Left Hip Internal Rotation  43   Lumbar Flexion 51   Lumbar Extension 5   Lumbar - Right Side Bend fingertips past knee    Lumbar - Left Side Bend fingertips past knee    Lumbar - Right Rotation DNT today due to hip pain    Lumbar - Left Rotation DNT due to hip pain with testing position    Strength   Right Hip Flexion 3-/5  pain limited    Right Hip ABduction 2+/5  sidelying, pain limited    Left Hip Flexion 4-/5   Right Knee Extension 3/5  painful    Left Knee Extension 4/5   Right Ankle Dorsiflexion 3/5   Left Ankle Dorsiflexion 4-/5   Palpation   Palpation comment signficant muscle tenderness and some guarding noted approx T12 to L4-5; tenderness over hip abuductrs, extensors, and hip flexors    Ambulation/Gait   Gait Comments internal rotation of R hip, severe proximal weakness, reduced gait speed    6 minute walk test results    Aerobic Endurance Distance Walked 526   Endurance additional comments 3MWT; gait speed 0.27m/s                            PT Education - 09/10/15 1113    Education provided Yes   Education Details prognosis, HEP, plan of care; on hold until she is completely added her husbands insurance, after which she will be made priority  for treatemnt    Person(s) Educated Patient   Methods Explanation;Handout   Comprehension Verbalized understanding;Returned demonstration          PT Short Term Goals - 09/10/15 1127    PT SHORT TERM GOAL #1   Title Patient will report pain no more than 4/10 in order to  improve patient's ability to participate in functional tasks and also to improve ability to sleep well at night    Time 3   Period Weeks   Status New   PT SHORT TERM GOAL #2   Title Patient will be able to perform 4 way SLS through at least 50% of gravity present range in order to demosntrate improving muscle strength and hip joint stability    Time 3   Period Weeks   Status New   PT SHORT TERM GOAL #3   Title Patient will be able to ambulate and stand at least 30 minutes with pain no more than 4/10 in order to improve participation in functional tasks    Time 3   Period Weeks   Status New   PT SHORT TERM GOAL #4   Title Patient to report numbness and tingling originating from hip has reduced by at least 50% in order to demosntrate partial resolution of symptoms   Time 3   Period Weeks   Status New   PT SHORT TERM GOAL #5   Title Patient to be consistently and correctly performing approprite HEP, to be updated PRN    Time 3   Period Weeks   Status New           PT Long Term Goals - 09/10/15 1132    PT LONG TERM GOAL #1   Title Pateint will report no more than 2/10 pain with all functional tasks and activities in order to improve ability to participate in functional tasks at home and at the workpalce    Time 7   Period Weeks   Status New   PT LONG TERM GOAL #2   Title Patient will demonstrate reduction in muscle tenderness and guarding by at least 50% in order to demosntrate resoluation of symptoms and assist in improving overall mobility    Time 7   Period Weeks   Status New   PT LONG TERM GOAL #3   Title Patient will be able to ambulate at least 60-90 minutes with pain no more than 2/10 in order  to improve ability to participate in functional tasks at home and at work    Time 7   Period Weeks   Status New   PT LONG TERM GOAL #4   Title Patient to demosntrate strength 4/5 in order to reduce pain, improve regional stability, and improve overall functional task performance    Time 7   Period Weeks   Status New   PT LONG TERM GOAL #5   Title Patient to report that she has been able to perform regular exercise, possibly in pool, for at least 30 minutes at least 2-3 days per week, in order to maintain functional gains and assist in keeping pain levels low    Time 7   Period Weeks   Status New               Plan - 09/10/15 1115    Clinical Impression Statement Pateint presents with ongoing R hip and low back pain, both of insidious onset that began when she was pregnant with her recent child in the late summer/early fall of 2016. She does report that she has a history of ankle injuries and has had numbness and tingling below the knee on the R with a history of more ankle injjureis on this side; she also reports taht she has been having numbness/tingling starting from her R hip and running all the way down the  front of her leg now, especially in sitting and supine. Upon examination, she does reveal severe muscle weakness, signfiicant gait impairment, postural deviations, severe tenderness from T12 down into all R hip musculature and down ITB to knee, and is severely limited in gait and overall mobility by pain. 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 At this time patient is being put on hold until she has completed the process of being put on her husband's insurance, after which she will be made a priorty treatment by skilled PT services ino rder to address functional limitations, reduce pain, and reach optimal level of function.     Pt will benefit from skilled therapeutic intervention in order to improve on the following deficits Abnormal  gait;Impaired sensation;Decreased activity tolerance;Decreased strength;Pain;Decreased balance;Difficulty walking;Increased muscle spasms;Improper body mechanics;Decreased coordination;Impaired flexibility;Postural dysfunction   Rehab Potential Good   PT Frequency 2x / week   PT Duration Other (comment)  7 weeks    PT Treatment/Interventions ADLs/Self Care Home Management;Cryotherapy;Moist Heat;Gait training;Stair training;Functional mobility training;Therapeutic activities;Therapeutic exercise;Balance training;Neuromuscular re-education;Patient/family education;Manual techniques;Energy conservation   PT Next Visit Plan review HEP and goals; isometrics to hip musculature; desensitization techniques, functional stretching as tolerated. Stay within pain tolerance. Rocerkboard, gait training.    PT Home Exercise Plan given    Consulted and Agree with Plan of Care Patient         Problem List Patient Active Problem List   Diagnosis Date Noted  . Nexplanon insertion 08/26/2015  . History of preterm delivery 08/26/2015  . History of chlamydia 08/26/2015  . Postpartum/situational depression 08/06/2015  . History of mastitis 07/14/2015  . Groin pain, chronic, right 07/14/2015    Deniece Ree PT, DPT [facility] [address] [place], [place], [address] Phone: [phone]   Fax:  [phone]  Name: Linda Reid MRN: [id number] Date of Birth: 1985-09-02

## 2015-09-10 NOTE — Patient Instructions (Signed)
    Isometric Hip Abduction on Wall  Standing next to wall, pick up leg closer to wall and push the side of your knee into the wall. Relax and bring leg back down before next rep.   Hold your leg into the wall for 2-3 seconds and relax. Repeat 5-10 times, twice a day.

## 2015-10-18 ENCOUNTER — Emergency Department (HOSPITAL_COMMUNITY)
Admission: EM | Admit: 2015-10-18 | Discharge: 2015-10-19 | Disposition: A | Discharge status: Home / Self Care | Payer: Medicaid Other | Attending: Emergency Medicine | Admitting: Emergency Medicine

## 2015-10-18 ENCOUNTER — Encounter (HOSPITAL_COMMUNITY): Payer: Self-pay | Admitting: Emergency Medicine

## 2015-10-18 ENCOUNTER — Emergency Department (HOSPITAL_COMMUNITY): Payer: Medicaid Other

## 2015-10-18 DIAGNOSIS — R1010 Upper abdominal pain, unspecified: Secondary | ICD-10-CM | POA: Diagnosis not present

## 2015-10-18 DIAGNOSIS — R112 Nausea with vomiting, unspecified: Secondary | ICD-10-CM | POA: Insufficient documentation

## 2015-10-18 DIAGNOSIS — R1110 Vomiting, unspecified: Secondary | ICD-10-CM | POA: Diagnosis present

## 2015-10-18 LAB — CBC WITH DIFFERENTIAL/PLATELET
BASOS ABS: 0 10*3/uL (ref 0.0–0.1)
BASOS PCT: 0 %
Eosinophils Absolute: 0.1 10*3/uL (ref 0.0–0.7)
Eosinophils Relative: 1 %
HEMATOCRIT: 41.2 % (ref 36.0–46.0)
HEMOGLOBIN: 13.6 g/dL (ref 12.0–15.0)
Lymphocytes Relative: 22 %
Lymphs Abs: 1.5 10*3/uL (ref 0.7–4.0)
MCH: 30.5 pg (ref 26.0–34.0)
MCHC: 33 g/dL (ref 30.0–36.0)
MCV: 92.4 fL (ref 78.0–100.0)
MONO ABS: 0.3 10*3/uL (ref 0.1–1.0)
Monocytes Relative: 5 %
NEUTROS ABS: 4.9 10*3/uL (ref 1.7–7.7)
NEUTROS PCT: 72 %
Platelets: 235 10*3/uL (ref 150–400)
RBC: 4.46 MIL/uL (ref 3.87–5.11)
RDW: 12.4 % (ref 11.5–15.5)
WBC: 6.7 10*3/uL (ref 4.0–10.5)

## 2015-10-18 LAB — COMPREHENSIVE METABOLIC PANEL
ALT: 16 U/L (ref 14–54)
ANION GAP: 7 (ref 5–15)
AST: 18 U/L (ref 15–41)
Albumin: 4.6 g/dL (ref 3.5–5.0)
Alkaline Phosphatase: 67 U/L (ref 38–126)
BILIRUBIN TOTAL: 0.4 mg/dL (ref 0.3–1.2)
BUN: 12 mg/dL (ref 6–20)
CO2: 26 mmol/L (ref 22–32)
Calcium: 9.2 mg/dL (ref 8.9–10.3)
Chloride: 110 mmol/L (ref 101–111)
Creatinine, Ser: 0.82 mg/dL (ref 0.44–1.00)
Glucose, Bld: 103 mg/dL — ABNORMAL HIGH (ref 65–99)
POTASSIUM: 3.5 mmol/L (ref 3.5–5.1)
Sodium: 143 mmol/L (ref 135–145)
TOTAL PROTEIN: 8.1 g/dL (ref 6.5–8.1)

## 2015-10-18 LAB — URINALYSIS, ROUTINE W REFLEX MICROSCOPIC
Bilirubin Urine: NEGATIVE
Glucose, UA: NEGATIVE mg/dL
KETONES UR: NEGATIVE mg/dL
LEUKOCYTES UA: NEGATIVE
NITRITE: NEGATIVE
PROTEIN: NEGATIVE mg/dL
Specific Gravity, Urine: >1.03 — ABNORMAL HIGH (ref 1.005–1.030)
pH: 6 (ref 5.0–8.0)

## 2015-10-18 LAB — URINE MICROSCOPIC-ADD ON: BACTERIA UA: NONE SEEN

## 2015-10-18 LAB — PREGNANCY, URINE: Preg Test, Ur: NEGATIVE

## 2015-10-18 LAB — LIPASE, BLOOD: LIPASE: 25 U/L (ref 11–51)

## 2015-10-18 MED ORDER — GI COCKTAIL ~~LOC~~
30.0000 mL | Freq: Once | ORAL | Status: AC
  Administered 2015-10-18: 30 mL via ORAL
  Filled 2015-10-18: qty 30

## 2015-10-18 MED ORDER — PROMETHAZINE HCL 25 MG/ML IJ SOLN
INTRAMUSCULAR | Status: AC
  Filled 2015-10-18: qty 1

## 2015-10-18 MED ORDER — FAMOTIDINE 20 MG PO TABS
40.0000 mg | ORAL_TABLET | Freq: Once | ORAL | Status: AC
  Administered 2015-10-18: 40 mg via ORAL
  Filled 2015-10-18: qty 2

## 2015-10-18 MED ORDER — ONDANSETRON HCL 4 MG/2ML IJ SOLN: 4.0000 mg | INTRAMUSCULAR | Status: DC | PRN

## 2015-10-18 MED ORDER — DIPHENHYDRAMINE HCL 50 MG/ML IJ SOLN
25.0000 mg | Freq: Once | INTRAMUSCULAR | Status: AC
  Administered 2015-10-19: 25 mg via INTRAVENOUS
  Filled 2015-10-18: qty 1

## 2015-10-18 MED ORDER — PROMETHAZINE HCL 25 MG/ML IJ SOLN
25.0000 mg | Freq: Once | INTRAMUSCULAR | Status: AC
  Administered 2015-10-18: 25 mg via INTRAMUSCULAR

## 2015-10-18 MED ORDER — SODIUM CHLORIDE 0.9 % IV BOLUS (SEPSIS)
1000.0000 mL | Freq: Once | INTRAVENOUS | Status: AC
  Administered 2015-10-19: 1000 mL via INTRAVENOUS

## 2015-10-18 MED ORDER — ONDANSETRON 8 MG PO TBDP
8.0000 mg | ORAL_TABLET | Freq: Once | ORAL | Status: AC
  Administered 2015-10-18: 8 mg via ORAL
  Filled 2015-10-18: qty 1

## 2015-10-18 MED ORDER — SODIUM CHLORIDE 0.9 % IV SOLN
INTRAVENOUS | Status: DC
  Administered 2015-10-19: 01:00:00 via INTRAVENOUS

## 2015-10-18 NOTE — ED Notes (Signed)
Pt states she started having really bad acid reflux about 2 hours ago and is now vomiting

## 2015-10-18 NOTE — ED Provider Notes (Signed)
CSN: [id number]     Arrival date & time 10/18/15  2032 History   First MD Initiated Contact with Patient 10/18/15 2051     Chief Complaint  Patient presents with  . Emesis     HPI  Pt was seen at 2100. Per pt, c/o gradual onset and persistence of multiple intermittent episodes of N/V that began approximately 2 hours PTA when she started to eat a meal. Has been associated with "burning" upper abd discomfort. States she has hx of similar symptoms, but has not sought medical treatment for them. States she "can usual just take a pepcid AC and I feel better." Denies abd pain, no CP/SOB, no back pain, no fevers, no black or blood in stools or emesis.    Past Medical History  Diagnosis Date  . Preterm labor   . Mastitis 07/03/2015  . Breast engorgement 07/03/2015  . History of mastitis 07/14/2015  . Groin pain, chronic, right 07/14/2015    Has pain right groin area since about end of August,?swollen lymph node noted today,she said had [name] after baby born to R/O DVT    Past Surgical History  Procedure Laterality Date  . Tonsillectomy     Family History  Problem Relation Age of Onset  . Diabetes Maternal Aunt   . Hypertension Maternal Aunt   . Stroke Maternal Aunt   . Diabetes Maternal Uncle   . Hypertension Maternal Uncle   . Hypertension Paternal Aunt   . Hypertension Paternal Uncle   . Cancer Maternal Grandmother     breast  . Diabetes Maternal Grandmother   . Hypertension Maternal Grandmother   . Heart disease Maternal Grandfather   . Hypertension Maternal Grandfather   . Hypertension Paternal Grandmother   . Hypertension Paternal Grandfather   . Premature birth Daughter   . Asthma Daughter    Social History  Substance Use Topics  . Smoking status: Never Smoker   . Smokeless tobacco: Never Used  . Alcohol Use: No   OB History    Gravida Para Term Preterm AB TAB SAB Ectopic Multiple Living   6 4 1 3 2  0 2 0 0 4     Review of Systems ROS: Statement: All systems  negative except as marked or noted in the HPI; Constitutional: Negative for fever and chills. ; ; Eyes: Negative for eye pain, redness and discharge. ; ; ENMT: Negative for ear pain, hoarseness, nasal congestion, sinus pressure and sore throat. ; ; Cardiovascular: Negative for chest pain, palpitations, diaphoresis, dyspnea and peripheral edema. ; ; Respiratory: Negative for cough, wheezing and stridor. ; ; Gastrointestinal: +N/V, abd pain. Negative for diarrhea, blood in stool, hematemesis, jaundice and rectal bleeding. . ; ; Genitourinary: Negative for dysuria, flank pain and hematuria. ; ; Musculoskeletal: Negative for back pain and neck pain. Negative for swelling and trauma.; ; Skin: Negative for pruritus, rash, abrasions, blisters, bruising and skin lesion.; ; Neuro: Negative for headache, lightheadedness and neck stiffness. Negative for weakness, altered level of consciousness , altered mental status, extremity weakness, paresthesias, involuntary movement, seizure and syncope.        Allergies  Review of patient's allergies indicates no known allergies.  Home Medications   Prior to Admission medications   Medication Sig Start Date End Date Taking? Authorizing Provider  cetirizine (ZYRTEC) 10 MG tablet Take 10 mg by mouth as needed for allergies. Reported on 08/26/2015    Historical Provider, MD  diclofenac (VOLTAREN) 75 MG EC tablet Take 1 tablet (75 mg  total) by mouth 2 (two) times daily. 09/08/15   Lily Kocher, PA-C  ibuprofen (ADVIL,MOTRIN) 800 MG tablet Take 1 tablet (800 mg total) by mouth 3 (three) times daily. 08/28/15   Carole Civil, MD  methocarbamol (ROBAXIN) 500 MG tablet Take 1 tablet (500 mg total) by mouth 3 (three) times daily. 08/28/15   Carole Civil, MD   BP 110/77 mmHg  Pulse 71  Temp(Src) 98.9 F (37.2 C) (Oral)  Resp 18  Ht 5\' 9"  (1.753 m)  Wt 160 lb (72.576 kg)  BMI 23.62 kg/m2  SpO2 98% Physical Exam  2105: Physical examination:  Nursing notes reviewed;  Vital signs and O2 SAT reviewed;  Constitutional: Well developed, Well nourished, Well hydrated, In no acute distress; Head:  Normocephalic, atraumatic; Eyes: EOMI, PERRL, No scleral icterus; ENMT: Mouth and pharynx normal, Mucous membranes moist; Neck: Supple, Full range of motion, No lymphadenopathy; Cardiovascular: Regular rate and rhythm, No murmur, rub, or gallop; Respiratory: Breath sounds clear & equal bilaterally, No rales, rhonchi, wheezes.  Speaking full sentences with ease, Normal respiratory effort/excursion; Chest: Nontender, Movement normal; Abdomen: Soft, Nontender, Nondistended, Normal bowel sounds; Genitourinary: No CVA tenderness; Extremities: Pulses normal, No tenderness, No edema, No calf edema or asymmetry.; Neuro: AA&Ox3, Major CN grossly intact.  Speech clear. No gross focal motor or sensory deficits in extremities.; Skin: Color normal, Warm, Dry.   ED Course  Procedures (including critical care time) Labs Review  Imaging Review  I have personally reviewed and evaluated these images and lab results as part of my medical decision-making.   EKG Interpretation None      MDM  MDM Reviewed: previous chart, nursing note and vitals Reviewed previous: labs Interpretation: labs and x-ray      Results for orders placed or performed during the hospital encounter of 10/18/15  Comprehensive metabolic panel  Result Value Ref Range   Sodium 143 135 - 145 mmol/L   Potassium 3.5 3.5 - 5.1 mmol/L   Chloride 110 101 - 111 mmol/L   CO2 26 22 - 32 mmol/L   Glucose, Bld 103 (H) 65 - 99 mg/dL   BUN 12 6 - 20 mg/dL   Creatinine, Ser 0.82 0.44 - 1.00 mg/dL   Calcium 9.2 8.9 - 10.3 mg/dL   Total Protein 8.1 6.5 - 8.1 g/dL   Albumin 4.6 3.5 - 5.0 g/dL   AST 18 15 - 41 U/L   ALT 16 14 - 54 U/L   Alkaline Phosphatase 67 38 - 126 U/L   Total Bilirubin 0.4 0.3 - 1.2 mg/dL   GFR calc non Af Amer >60 >60 mL/min   GFR calc Af Amer >60 >60 mL/min   Anion gap 7 5 - 15  Lipase, blood   Result Value Ref Range   Lipase 25 11 - 51 U/L  CBC with Differential  Result Value Ref Range   WBC 6.7 4.0 - 10.5 K/uL   RBC 4.46 3.87 - 5.11 MIL/uL   Hemoglobin 13.6 12.0 - 15.0 g/dL   HCT 41.2 36.0 - 46.0 %   MCV 92.4 78.0 - 100.0 fL   MCH 30.5 26.0 - 34.0 pg   MCHC 33.0 30.0 - 36.0 g/dL   RDW 12.4 11.5 - 15.5 %   Platelets 235 150 - 400 K/uL   Neutrophils Relative % 72 %   Neutro Abs 4.9 1.7 - 7.7 K/uL   Lymphocytes Relative 22 %   Lymphs Abs 1.5 0.7 - 4.0 K/uL   Monocytes Relative 5 %  Monocytes Absolute 0.3 0.1 - 1.0 K/uL   Eosinophils Relative 1 %   Eosinophils Absolute 0.1 0.0 - 0.7 K/uL   Basophils Relative 0 %   Basophils Absolute 0.0 0.0 - 0.1 K/uL  Urinalysis, Routine w reflex microscopic  Result Value Ref Range   Color, Urine YELLOW YELLOW   APPearance CLEAR CLEAR   Specific Gravity, Urine >1.030 (H) 1.005 - 1.030   pH 6.0 5.0 - 8.0   Glucose, UA NEGATIVE NEGATIVE mg/dL   Hgb urine dipstick SMALL (A) NEGATIVE   Bilirubin Urine NEGATIVE NEGATIVE   Ketones, ur NEGATIVE NEGATIVE mg/dL   Protein, ur NEGATIVE NEGATIVE mg/dL   Nitrite NEGATIVE NEGATIVE   Leukocytes, UA NEGATIVE NEGATIVE  Pregnancy, urine  Result Value Ref Range   Preg Test, Ur NEGATIVE NEGATIVE  Urine microscopic-add on  Result Value Ref Range   Squamous Epithelial / LPF 0-5 (A) NONE SEEN   WBC, UA 0-5 0 - 5 WBC/hpf   RBC / HPF 0-5 0 - 5 RBC/hpf   Bacteria, UA NONE SEEN NONE SEEN   Dg Abd Acute W/chest 10/18/2015  CLINICAL DATA:  Nausea, vomiting today. EXAM: DG ABDOMEN ACUTE W/ 1V CHEST COMPARISON:  None. FINDINGS: Cardiomediastinal silhouette is normal. Lungs are clear, no pleural effusions. No pneumothorax. Soft tissue planes and included osseous structures are unremarkable. Bowel gas pattern is nondilated and nonobstructive. Mild amount of retained large bowel stool. No intra-abdominal mass effect, pathologic calcifications or free air. Soft tissue planes and included osseous structures are  non-suspicious. IMPRESSION: Normal chest. Normal bowel gas pattern. Electronically Signed   By: Elon Alas M.D.   On: 10/18/2015 23:43    0020:  Multiple doses of anti-emetics given due to continued N/V. Pt now asleep. No further vomiting. Workup reassuring. Will need to wake up and tol PO before final disposition. Sign out to Dr. Roxanne Mins.    Francine Graven, DO 10/19/15 0025

## 2015-10-19 MED ORDER — PROMETHAZINE HCL 25 MG PO TABS: 25.0000 mg | ORAL_TABLET | Freq: Four times a day (QID) | ORAL | Status: DC | PRN

## 2015-10-19 MED ORDER — PROMETHAZINE HCL 25 MG RE SUPP: 25.0000 mg | Freq: Four times a day (QID) | RECTAL | Status: DC | PRN

## 2015-10-19 NOTE — ED Notes (Signed)
Pt is tolerating PO fluids at this time. Pt has not had an episode of V/ since PO fluid challenge.

## 2015-10-19 NOTE — ED Provider Notes (Signed)
Patient initially seen and evaluated by Dr. Thurnell Garbe with vomiting which was difficult to control. After getting IV fluids and antiemetics, she has successfully completed a fluid challenge with no recurrent vomiting and is discharged.  Delora Fuel, MD [phone] [id number]

## 2015-10-19 NOTE — Discharge Instructions (Signed)
°Emergency Department Resource Guide °1) Find a Doctor and Pay Out of Pocket °Although you won't have to find out who is covered by your insurance plan, it is a good idea to ask around and get recommendations. You will then need to call the office and see if the doctor you have chosen will accept you as a new patient and what types of options they offer for patients who are self-pay. Some doctors offer discounts or will set up payment plans for their patients who do not have insurance, but you will need to ask so you aren't surprised when you get to your appointment. ° °2) Contact Your Local Health Department °Not all health departments have doctors that can see patients for sick visits, but many do, so it is worth a call to see if yours does. If you don't know where your local health department is, you can check in your phone book. The CDC also has a tool to help you locate your state's health department, and many state websites also have listings of all of their local health departments. ° °3) Find a Walk-in Clinic °If your illness is not likely to be very severe or complicated, you may want to try a walk in clinic. These are popping up all over the country in pharmacies, drugstores, and shopping centers. They're usually staffed by nurse practitioners or physician assistants that have been trained to treat common illnesses and complaints. They're usually fairly quick and inexpensive. However, if you have serious medical issues or chronic medical problems, these are probably not your best option. ° °No Primary Care Doctor: °- Call [facility] Connect at  832-8000 - they can help you locate a primary care doctor that  accepts your insurance, provides certain services, etc. °- Physician Referral Service- 1-800-533-3463 ° °Chronic Pain Problems: °Organization         Address  Phone   Notes  °[facility]  ([phone]) [phone] Patients need to be referred by their primary care doctor.  ° °Medication  Assistance: °Organization         Address  Phone   Notes  °[place] [facility] [address] °[place], [place] [address] (336) 641-8030 --Must be a resident of [place] °-- Must have NO insurance coverage whatsoever (no Medicaid/ Medicare, etc.) °-- The pt. MUST have a primary care doctor that directs their care regularly and follows them in the community °  °[facility]  (866) 331-1348   °[employer]  (888) 892-1162   ° °Agencies that provide inexpensive medical care: °Organization         Address  Phone   Notes  °[facility]  (336) 832-8035   °[facility]    (336) 832-7272   °[facility] [address] °[place], [place] [address] (336) 832-4777   °[facility] [address], °[place] (336) 271-4999   °[facility]    (336) 373-0678   °[facility]    (336) 272-1050   °[facility] ° [address],  Phone:  (336) 832-4444, Fax:  (336) 832-4440 Hours of Operation:  9 am - 6 pm, M-F.  Also accepts Medicaid/Medicare and self-pay.  °[facility] ° [address],  Phone: (336) 832-3150, Fax: (336) [phone]. Hours of Operation:  8:30 am - 5:30 pm, M-F.  Also accepts Medicaid and self-pay.  °[facility] [place] [address], [place] Phone: (336) 878-6027   °[facility] [address], [place], [place] (336)723-1848, Ext. 123 Mondays & Thursdays: 7-9 AM.  First 15 patients are seen on a first come, first serve basis. °  ° °Medicaid-accepting [place] Providers: ° °Organization         Address  Phone   Notes  °[facility] [address], [place] (336) 641-2100 Also accepts self-pay patients.  °[facility] [address], [place] ° (336) 856-9996   °[facility] [address], [place]  (336) 288-8857   °[facility] [address] [place] [address], [place] (336) 299-7000   °Ve[facility] [address], [place]  ° (336) 373-1557 Only accepts [place] Access Medicaid patients after they have their name applied to their card.  ° °Self-Pay (no insurance) in [place]: ° °Organization         Address  Phone   Notes  °Sickle Cell Patients, [facility] [address], [place] (336) 832-1970   °[facility] [address], [place] (336) 832-4400   ° [facility] ° [address], [place] (336) 992-4800   °[facility]/Dr. Osei-Bonsu ° [address] [place] [address], [place] or [address], [place] (336) [phone] Phone number for both [place] and [place] locations is the same.  °[facility] [address], [place] (336) 299-0000   °[facility] [address] [place] [address], [place] or [address] (336) 852-7530 °(336) 878-2260   °[facility] [address], [place] (336) 350-1642, phone; (336) 294-5005, fax Sees patients 1st and 3rd Saturday of every month.  Must not qualify for public or private insurance (i.e. Medicaid, Medicare, [place] Health Choice, Veterans' Benefits) • Household income should be no more than 200% of the poverty level •The clinic cannot treat you if you are pregnant or think you are pregnant • Sexually transmitted diseases are not treated at the clinic.  ° ° °Dental Care: °Organization         Address  Phone  Notes  °[place] [facility] [address], [place] (336) 641-6152 Accepts children up to age 21 who are enrolled in Medicaid or [place] Health Choice; pregnant women with a Medicaid card; and children who have applied for Medicaid or [place] Health Choice, but were declined, whose parents can pay a reduced fee at time of service.  °[place]  [facility] [place]  [address], [place] (336) 641-7733 Accepts children up to age 21 who are enrolled in Medicaid or [place] Health Choice; pregnant women with a Medicaid card; and children who have applied for Medicaid or [place] Health Choice, but were declined, whose parents can pay a reduced fee at time of service.  °[facility] ° [address], [place] (336) 641-4533 Patients are seen by appointment only. Walk-ins are not accepted. [facility] will see patients 18 years of age and older. °Monday - Tuesday (8am-5pm) °Most Wednesdays (8:30-5pm) °$30 per visit, cash only  °[facility] ° [address], [place] (336) 641-4533 Patients are seen by appointment only. Walk-ins are not accepted. [facility] will see patients 18 years of age and older. °One   Wednesday Evening (Monthly: Volunteer Based).  $30 per visit, cash only  °[facility]  (919) 537-3737 for adults; Children under age 4, call Graduate Pediatric Dentistry at (919) [phone]. Children aged 4-14, please call (919) [phone] to request a pediatric application. ° Dental services are provided in all areas of dental care including fillings, crowns and bridges, complete and partial dentures, implants, gum treatment, root canals, and extractions. Preventive care is also provided. Treatment is provided to both adults and children. °Patients are selected via a lottery and there is often a waiting list. °  °[facility] [address], °[place] ° (336) 763-8833 [email or web address] °  °[facility] [address], [place], [place] (336)723-1848, Ext. 123 Second and Fourth Thursday of each month, opens at 6:30 AM; Clinic ends at 9 AM.  Patients are seen on a first-come first-served basis, and a limited number are seen during each clinic.  ° °[facility] ° [address], [place], [place] (336) 723-7904    Eligibility Requirements °You must have lived in [place], [place], or [place] counties for at least the last three months. °  You cannot be eligible for state or federal sponsored healthcare insurance, including Veterans Administration, Medicaid, or Medicare. °  You generally cannot be eligible for healthcare insurance through your employer.  °  How to apply: °Eligibility screenings are held every Tuesday and Wednesday afternoon from 1:00 pm until 4:00 pm. You do not need an appointment for the interview!  °[facility] [address], [place], [place] 336-631-2330   °[facility]  336-342-8273   °[facility]  336-703-3100   °[facility]  336-570-6415   ° °Behavioral Health Resources in the Community: °Intensive Outpatient Programs °Organization         Address  Phone  Notes  °[facility] [address], [place], [place] 336-878-6098   °[facility] [address], [place], [place] 336-832-9800   °[facility] [address], [place], [place] ° 336-882-2125   °[facility] [address],  °[place], [place] 1-800-853-5163 or 336-641-4981   °Substance Abuse Resources °Organization         Address  Phone  Notes  °[facility]  336-882-2125   °[facility]  336-784-9470   °[facility]  336-285-9073   °[facility]  336-845-3988   °Residential & Outpatient Substance Abuse Program  1-800-659-3381   °Psychological Services °Organization         Address  Phone  Notes  °[facility]  336- 832-9600   °[facility]  336- 378-7881   °[facility] [address], [place] 1-800-853-5163 or 336-641-4981   ° °Mobile Crisis Teams °Organization         Address  Phone  Notes  °[facility], Mobile Crisis Care Unit  1-877-626-1772   °Assertive °[facility] ° [address]  [place], [place] 336-834-9664   °Sharon DeEsch [address] °[place] 336-554-5454   ° °Self-Help/Support Groups °Organization         Address  Phone             Notes  °[facility] [place] - variety of support groups  336- 373-1402 Call for more information  °Narcotics Anonymous (NA), [facility] [address], °[place]  2 meetings at this location  ° °  Residential Treatment Programs Organization         Address  Phone  Notes  [facility] [address],    [place]  1-9891139387   [facility]  [address], [place] [id number], [place], [place]   [place], [place] 480-104-2233 Admissions: 8am-3pm M-F  [facility] [address],    [place], [place] [phone]   [facility] [address] [place], [place], [place]   [facility] [address]  [place], [place]   [facility] - Intensive Outpatient [address], [place], [place]   [facility] [address]  [place], [place] 1-907-122-5680 or 313-654-6494   [facility] [address], [place], [place] Accepts Medicaid  [facility] [address],  [place] 1-(585)652-0586 Substance Abuse/Addiction Treatment   [facility] Organization         Address  Phone  Notes  [facility]  3403744442   Domenic Schwab, PhD [address] [place] [place], [place]   681-514-8214 or 925-614-7961   [place], [place] (737)199-9005   [facility] [address], [place], [place] 684-062-9526 Insurance/Medicaid/sponsorship through [facility] [address]                                    [place], [place] 919-710-5573 [place] [address], [place] [phone]    Dr. Adele Schilder  [phone]   [facility] Dept. 1) [address] [place] 2) [place] 3)  [address], [place] [phone]  [phone]   [place] (218)782-3070 or (409) [phone] (After Hours)      Eat a bland diet, avoiding greasy, fatty, fried foods, as well as spicy and acidic foods or beverages.  Avoid eating within the hour or 2 before going to bed or laying down.  Also avoid teas, colas, coffee, chocolate, pepermint and spearment.  Take over the counter pepcid, one tablet by mouth twice a day, for the next 2 to 3 weeks.  May also take over the counter maalox/mylanta, as directed on packaging, as needed for discomfort.  Take the prescription anti-emetic as directed (either take orally or rectally).  Call your regular medical doctor on Monday to schedule a follow up appointment in the next 3 days. Call the GI doctor on Monday to schedule a follow up appointment this week.  Return to the Emergency Department immediately if worsening.

## 2015-10-21 ENCOUNTER — Encounter: Payer: Self-pay | Admitting: Orthopedic Surgery

## 2015-10-21 ENCOUNTER — Other Ambulatory Visit: Payer: Self-pay | Admitting: *Deleted

## 2015-10-21 ENCOUNTER — Ambulatory Visit (INDEPENDENT_AMBULATORY_CARE_PROVIDER_SITE_OTHER): Payer: Medicaid Other | Admitting: Orthopedic Surgery

## 2015-10-21 VITALS — BP 121/76 | Ht 69.0 in | Wt 160.0 lb

## 2015-10-21 DIAGNOSIS — M24159 Other articular cartilage disorders, unspecified hip: Secondary | ICD-10-CM

## 2015-10-21 DIAGNOSIS — M169 Osteoarthritis of hip, unspecified: Secondary | ICD-10-CM

## 2015-10-21 NOTE — Patient Instructions (Signed)
MRI ORDERED. We will contact your insurance company for pre-certification. After we receive that, we will schedule you an appointment for the MRI and contact you. If you have not heard from our office in one week, contact us.     

## 2015-10-21 NOTE — Progress Notes (Signed)
Chief Complaint  Patient presents with  . Follow-up    FOLLOW UP RIGHT HIP PAIN    History the patient is 30 years old she had 5 months history of right hip pain and some lower back pain since childbirth after delivery.  She complains of pain swelling locking and stiffness in the right groin with occasional radiation into the lumbar spine unrelieved by Tylenol.  We treated her with ibuprofen and Robaxin. Physical therapy for 6 weeks  Still complains of worsening right hip pain and lower back pain  Review of systems we noted on January 5 she complained of fatigue ankle and leg swelling tingling in the legs weakness in the right hip and leg stiffness in the lower back. No bowel or bladder dysfunction at this time constitutional symptoms.  At this point it would be prudent to get an MRI with contrast of the right hip to evaluate her for labral tear since we have not improved after physical therapy and anti-inflammatory medication and Robaxin  Symptoms are not improving  MRI will be scheduled patient will come back for results

## 2015-10-22 ENCOUNTER — Telehealth: Payer: Self-pay | Admitting: Gastroenterology

## 2015-10-22 ENCOUNTER — Encounter: Payer: Self-pay | Admitting: Gastroenterology

## 2015-10-22 NOTE — Telephone Encounter (Signed)
Pt was seen in the ED recently and was advised to make OV with [name] in one week. Please advise. She would be a new patient.

## 2015-10-22 NOTE — Telephone Encounter (Signed)
Reviewed ER records and epic records. OK to make nonurgent new patient appointment.

## 2015-10-22 NOTE — Telephone Encounter (Signed)
OV made and letter mailed °

## 2015-10-28 ENCOUNTER — Telehealth: Payer: Self-pay | Admitting: Gastroenterology

## 2015-10-28 ENCOUNTER — Ambulatory Visit: Payer: Medicaid Other | Admitting: Gastroenterology

## 2015-10-28 ENCOUNTER — Encounter: Payer: Self-pay | Admitting: Gastroenterology

## 2015-10-28 NOTE — Telephone Encounter (Signed)
PT WAS A NO SHOW AND LETTER SENT  °

## 2015-10-31 ENCOUNTER — Other Ambulatory Visit: Payer: Self-pay | Admitting: Orthopedic Surgery

## 2015-10-31 ENCOUNTER — Ambulatory Visit (HOSPITAL_COMMUNITY)
Admission: RE | Admit: 2015-10-31 | Discharge: 2015-10-31 | Disposition: A | Discharge status: Home / Self Care | Payer: Medicaid Other | Source: Ambulatory Visit | Attending: Orthopedic Surgery | Admitting: Orthopedic Surgery

## 2015-10-31 DIAGNOSIS — M24159 Other articular cartilage disorders, unspecified hip: Secondary | ICD-10-CM

## 2015-10-31 DIAGNOSIS — M169 Osteoarthritis of hip, unspecified: Principal | ICD-10-CM

## 2015-10-31 DIAGNOSIS — M1611 Unilateral primary osteoarthritis, right hip: Secondary | ICD-10-CM | POA: Diagnosis not present

## 2015-10-31 MED ORDER — LIDOCAINE HCL (PF) 1 % IJ SOLN
INTRAMUSCULAR | Status: AC
  Filled 2015-10-31: qty 5

## 2015-10-31 MED ORDER — GADOBENATE DIMEGLUMINE 529 MG/ML IV SOLN
5.0000 mL | Freq: Once | INTRAVENOUS | Status: AC | PRN
Start: 2015-10-31 — End: 2015-10-31
  Administered 2015-10-31: 0.025 mL via INTRA_ARTICULAR

## 2015-10-31 MED ORDER — IOHEXOL 300 MG/ML  SOLN
50.0000 mL | Freq: Once | INTRAMUSCULAR | Status: AC | PRN
  Administered 2015-10-31: 8 mL via INTRAVENOUS

## 2015-10-31 MED ORDER — POVIDONE-IODINE 10 % EX SOLN
CUTANEOUS | Status: AC
  Filled 2015-10-31: qty 15

## 2015-10-31 NOTE — Procedures (Signed)
Preprocedure Dx: RT hip pain ? Labral tear Postprocedure Dx: RT hip pain ? Labral tear Procedure  Fluoroscopically guided RT hip joint injection for MR arthrogrpahy Radiologist:  Thornton Papas Anesthesia:  5 ml of 1% lidocaine Injectate:  8.5 ml of (0.05 ml Multihance in 20 ml Omnipaque 300) Fluoro time:  4 minutes 12 seconds EBL:   < 1 ml Complications: None

## 2015-11-04 ENCOUNTER — Ambulatory Visit (INDEPENDENT_AMBULATORY_CARE_PROVIDER_SITE_OTHER): Payer: Medicaid Other | Admitting: Orthopedic Surgery

## 2015-11-04 VITALS — BP 126/71 | HR 78 | Ht 69.0 in | Wt 180.0 lb

## 2015-11-04 DIAGNOSIS — M169 Osteoarthritis of hip, unspecified: Secondary | ICD-10-CM | POA: Diagnosis not present

## 2015-11-04 DIAGNOSIS — M7611 Psoas tendinitis, right hip: Secondary | ICD-10-CM | POA: Diagnosis not present

## 2015-11-04 DIAGNOSIS — M545 Low back pain, unspecified: Secondary | ICD-10-CM

## 2015-11-04 DIAGNOSIS — M24159 Other articular cartilage disorders, unspecified hip: Secondary | ICD-10-CM

## 2015-11-04 MED ORDER — TRAMADOL HCL 50 MG PO TABS: 50.0000 mg | ORAL_TABLET | Freq: Four times a day (QID) | ORAL | Status: DC | PRN

## 2015-11-04 MED ORDER — CYCLOBENZAPRINE HCL 5 MG PO TABS: 5.0000 mg | ORAL_TABLET | Freq: Three times a day (TID) | ORAL | Status: DC

## 2015-11-04 MED ORDER — NAPROXEN 500 MG PO TABS: 500.0000 mg | ORAL_TABLET | Freq: Two times a day (BID) | ORAL | Status: DC

## 2015-11-04 NOTE — Progress Notes (Signed)
Patient ID: Linda Reid, female   DOB: [birth year], 30 y.o.   MRN: [id number]  Chief Complaint  Patient presents with  . Follow-up    Right hip s/p MRI/Arthrogram   Encounter Diagnoses  Name Primary?  . Labral tear of hip, degenerative Yes  . Femoral acetabular impingement, right   . Midline low back pain without sciatica      HPI follow-up after MRI arthrogram which showed degenerative changes of the anterior superior labrum patient is not getting relief of pain with ibuprofen Robaxin. With pain radiates from the right groin to the lower back no frank radicular symptoms  Review of Systems  Musculoskeletal: Positive for back pain.  Neurological: Positive for tingling.       Numbness right leg from the knee down intermittent last 15-30 seconds    BP 126/71 mmHg  Pulse 78  Ht 5\' 9"  (1.753 m)  Wt 180 lb (81.647 kg)  BMI 26.57 kg/m2  MRI was reviewed with the following report which I'm in agreement with   IMPRESSION: Mildly degenerated anterior, superior labrum without tear. The study is otherwise unremarkable.   Electronically Signed  By: Inge Rise M.D.  On: 10/31/2015 11:49   ASSESSMENT AND PLAN   Lower back pain Degeneration right hip anterior superior labrum consistent with impingement syndrome of the hip FAI  Changed medication follow-up in 3 months

## 2016-02-03 ENCOUNTER — Ambulatory Visit (INDEPENDENT_AMBULATORY_CARE_PROVIDER_SITE_OTHER): Payer: Medicaid Other | Admitting: Orthopedic Surgery

## 2016-02-03 ENCOUNTER — Ambulatory Visit (INDEPENDENT_AMBULATORY_CARE_PROVIDER_SITE_OTHER): Payer: Medicaid Other

## 2016-02-03 ENCOUNTER — Encounter: Payer: Self-pay | Admitting: Orthopedic Surgery

## 2016-02-03 VITALS — BP 122/75 | Ht 68.5 in | Wt 185.0 lb

## 2016-02-03 DIAGNOSIS — M169 Osteoarthritis of hip, unspecified: Secondary | ICD-10-CM | POA: Diagnosis not present

## 2016-02-03 DIAGNOSIS — M5126 Other intervertebral disc displacement, lumbar region: Secondary | ICD-10-CM | POA: Diagnosis not present

## 2016-02-03 DIAGNOSIS — M5441 Lumbago with sciatica, right side: Secondary | ICD-10-CM

## 2016-02-03 DIAGNOSIS — M24159 Other articular cartilage disorders, unspecified hip: Secondary | ICD-10-CM

## 2016-02-03 MED ORDER — GABAPENTIN 100 MG PO CAPS: ORAL_CAPSULE | ORAL | Status: DC

## 2016-02-03 MED ORDER — PREDNISONE 10 MG (48) PO TBPK: ORAL_TABLET | Freq: Every day | ORAL | Status: DC

## 2016-02-03 NOTE — Progress Notes (Signed)
Patient ID: Linda Reid, female   DOB: 09-15-1985, 30 y.o.   MRN: [id number]  Chief Complaint  Patient presents with  . Follow-up    right leg swelling and radicular pain    HPI the patient's symptoms started in August last year right hip pain and lower back pain since her delivery.  Symptoms pain swelling locking stiffness giving way tingling. Description sharp throbbing stabbing aching pain groin and groin pain increases then the back pain increases. Constant pain 7 out of 10 unrelieved by Tylenol aggravated by long periods of standing or lying down not walking.  Prior treatments include ibuprofen, Voltaren, naproxen, Robaxin, Flexeril and tramadol  She had an MRI of her hip with contrast which showed a degenerative labrum without tear  NOW Complains of lower back pain radiating posterior aspect right leg into right foot with weakness right leg and right foot and altered sensation right foot.  Review of Systems Review of Systems  Fatigue ankle leg swelling and tingling in the legs weakness joint limb pain muscle weakness stiff joints swollen joints back pain all other systems including bowel bladder function normal and no constitutional symptoms   Review of Systems  Constitutional: Negative for fever and chills.  Gastrointestinal: Negative for heartburn, nausea, vomiting, abdominal pain, diarrhea and constipation.  Genitourinary: Negative for dysuria, urgency and frequency.  Neurological: Positive for tingling and focal weakness.    Past Medical History  Diagnosis Date  . Preterm labor   . Mastitis 07/03/2015  . Breast engorgement 07/03/2015  . History of mastitis 07/14/2015  . Groin pain, chronic, right 07/14/2015    Has pain right groin area since about end of August,?swollen lymph node noted today,she said had [name] after baby born to R/O DVT     BP 122/75 mmHg  Ht 5' 8.5" (1.74 m)  Wt 185 lb (83.915 kg)  BMI 27.72 kg/m2  Physical Exam Physical Exam   Constitutional: The patient appears well-developed and well-nourished. No distress.  The patient is oriented to person, place, and time.  Psychiatric: The patient has a normal mood and affect.  Cardiovascular: Intact distal pulses. Right and left foot  Neurological: sensation is normal left foot abnormal L4-L5 and S1 and the right foot Skin: Skin is warm and dry. No rash noted. The patient is not diaphoretic. No erythema. No pallor.   Both knees and hips are stable and the right and left leg. Back Exam   Tenderness  The patient is experiencing tenderness in the lumbar and thoracic.  Range of Motion  Extension: abnormal  Flexion: abnormal   Muscle Strength  Right Quadriceps:  5/5  Left Quadriceps:  5/5  Right Hamstrings:  5/5  Left Hamstrings:  5/5   Tests  Straight leg raise right: positive Straight leg raise left: negative  Reflexes  Patellar: normal Achilles: normal Babinski's sign: normal   Other  Gait: abnormal  Erythema: no back redness Scars: absent  Comments:  Left hip range of motion remains normal. She has mild pain with flexion internal rotation of the right hip but symptoms primarily are lower back and radicular.      Straight leg raise positive on the right side at 45 negative on the left   ASSESSMENT AND PLAN   IMAGING STUDIES   Radiology report 2 views right hip  The right groin pain  The femoral neck angle appears to be slightly more valgus. No problems with the femoral head  We do see decreased femoral head offset on the  lateral x-ray with a cam type acetabular impingement only scenario  Clinical correlation needed for further diagnostic information and may need MRI with arthrogram to assess the labrum  MRI RT HIP  IMPRESSION: Mildly degenerated anterior, superior labrum without tear. The study is otherwise unremarkable.     Electronically Signed   By: Inge Rise M.D.   On: 10/31/2015 11:49   I ordered new x-rays of the  lumbar spine: Mild facet arthritis at L5-S1 I dictated a full report see details of that report concluded by reference  Recommend MRI of the lumbar spine  Also recommend prednisone Dosepak Gabapentin 200 mg 3 times a day   Arther Abbott, MD 02/03/2016 9:59 AM

## 2016-02-03 NOTE — Patient Instructions (Addendum)
We will obtain pre-certification from the insurer and call you to schedule the study. Dr Aline Brochure will SEE YOU TO DISCUSS THE RESULTS  NEW MEDS  Meds ordered this encounter  Medications  . predniSONE (STERAPRED UNI-PAK 48 TAB) 10 MG (48) TBPK tablet    Sig: Take by mouth daily. 10 MG DOSE PACK X 12 DAYS    Dispense:  48 tablet    Refill:  1  . gabapentin (NEURONTIN) 100 MG capsule    Sig: 100 MG 2 TABS 3 TIMES A DAY    Dispense:  90 capsule    Refill:  2

## 2016-02-03 NOTE — Addendum Note (Signed)
Addended by: Baldomero Lamy B on: 02/03/2016 10:26 AM   Modules accepted: Orders

## 2016-02-11 ENCOUNTER — Ambulatory Visit (HOSPITAL_COMMUNITY)
Admission: RE | Admit: 2016-02-11 | Discharge: 2016-02-11 | Disposition: A | Discharge status: Home / Self Care | Payer: Medicaid Other | Source: Ambulatory Visit | Attending: Orthopedic Surgery | Admitting: Orthopedic Surgery

## 2016-02-11 DIAGNOSIS — M4807 Spinal stenosis, lumbosacral region: Secondary | ICD-10-CM | POA: Insufficient documentation

## 2016-02-11 DIAGNOSIS — M5126 Other intervertebral disc displacement, lumbar region: Secondary | ICD-10-CM | POA: Insufficient documentation

## 2016-02-16 ENCOUNTER — Encounter (HOSPITAL_COMMUNITY): Payer: Self-pay | Admitting: *Deleted

## 2016-02-16 ENCOUNTER — Emergency Department (HOSPITAL_COMMUNITY)
Admission: EM | Admit: 2016-02-16 | Discharge: 2016-02-16 | Disposition: A | Discharge status: Home / Self Care | Payer: Medicaid Other | Attending: Emergency Medicine | Admitting: Emergency Medicine

## 2016-02-16 DIAGNOSIS — Z79899 Other long term (current) drug therapy: Secondary | ICD-10-CM | POA: Diagnosis not present

## 2016-02-16 DIAGNOSIS — M545 Low back pain: Secondary | ICD-10-CM | POA: Diagnosis present

## 2016-02-16 DIAGNOSIS — M5431 Sciatica, right side: Secondary | ICD-10-CM

## 2016-02-16 DIAGNOSIS — Z791 Long term (current) use of non-steroidal anti-inflammatories (NSAID): Secondary | ICD-10-CM | POA: Diagnosis not present

## 2016-02-16 MED ORDER — ORPHENADRINE CITRATE ER 100 MG PO TB12: 100.0000 mg | ORAL_TABLET | Freq: Two times a day (BID) | ORAL | Status: DC

## 2016-02-16 MED ORDER — OXYCODONE-ACETAMINOPHEN 5-325 MG PO TABS: 1.0000 | ORAL_TABLET | ORAL | Status: DC | PRN

## 2016-02-16 MED ORDER — NAPROXEN 500 MG PO TABS: 500.0000 mg | ORAL_TABLET | Freq: Two times a day (BID) | ORAL | Status: DC

## 2016-02-16 NOTE — ED Notes (Signed)
Pt c/o lower back pain that radiates down right leg, reports that she has seen ortho and had PT with no improvement in symptoms, denies any injury,

## 2016-02-16 NOTE — Discharge Instructions (Signed)
Sciatica °Sciatica is pain, weakness, numbness, or tingling along the path of the sciatic nerve. The nerve starts in the lower back and runs down the back of each leg. The nerve controls the muscles in the lower leg and in the back of the knee, while also providing sensation to the back of the thigh, lower leg, and the sole of your foot. Sciatica is a symptom of another medical condition. For instance, nerve damage or certain conditions, such as a herniated disk or bone spur on the spine, pinch or put pressure on the sciatic nerve. This causes the pain, weakness, or other sensations normally associated with sciatica. Generally, sciatica only affects one side of the body. °CAUSES  °· Herniated or slipped disc. °· Degenerative disk disease. °· A pain disorder involving the narrow muscle in the buttocks (piriformis syndrome). °· Pelvic injury or fracture. °· Pregnancy. °· Tumor (rare). °SYMPTOMS  °Symptoms can vary from mild to very severe. The symptoms usually travel from the low back to the buttocks and down the back of the leg. Symptoms can include: °· Mild tingling or dull aches in the lower back, leg, or hip. °· Numbness in the back of the calf or sole of the foot. °· Burning sensations in the lower back, leg, or hip. °· Sharp pains in the lower back, leg, or hip. °· Leg weakness. °· Severe back pain inhibiting movement. °These symptoms may get worse with coughing, sneezing, laughing, or prolonged sitting or standing. Also, being overweight may worsen symptoms. °DIAGNOSIS  °Your caregiver will perform a physical exam to look for common symptoms of sciatica. He or she may ask you to do certain movements or activities that would trigger sciatic nerve pain. Other tests may be performed to find the cause of the sciatica. These may include: °· Blood tests. °· X-rays. °· Imaging tests, such as an MRI or CT scan. °TREATMENT  °Treatment is directed at the cause of the sciatic pain. Sometimes, treatment is not necessary  and the pain and discomfort goes away on its own. If treatment is needed, your caregiver may suggest: °· Over-the-counter medicines to relieve pain. °· Prescription medicines, such as anti-inflammatory medicine, muscle relaxants, or narcotics. °· Applying heat or ice to the painful area. °· Steroid injections to lessen pain, irritation, and inflammation around the nerve. °· Reducing activity during periods of pain. °· Exercising and stretching to strengthen your abdomen and improve flexibility of your spine. Your caregiver may suggest losing weight if the extra weight makes the back pain worse. °· Physical therapy. °· Surgery to eliminate what is pressing or pinching the nerve, such as a bone spur or part of a herniated disk. °HOME CARE INSTRUCTIONS  °· Only take over-the-counter or prescription medicines for pain or discomfort as directed by your caregiver. °· Apply ice to the affected area for 20 minutes, 3-4 times a day for the first 48-72 hours. Then try heat in the same way. °· Exercise, stretch, or perform your usual activities if these do not aggravate your pain. °· Attend physical therapy sessions as directed by your caregiver. °· Keep all follow-up appointments as directed by your caregiver. °· Do not wear high heels or shoes that do not provide proper support. °· Check your mattress to see if it is too soft. A firm mattress may lessen your pain and discomfort. °SEEK IMMEDIATE MEDICAL CARE IF:  °· You lose control of your bowel or bladder (incontinence). °· You have increasing weakness in the lower back, pelvis, buttocks,   or legs. °· You have redness or swelling of your back. °· You have a burning sensation when you urinate. °· You have pain that gets worse when you lie down or awakens you at night. °· Your pain is worse than you have experienced in the past. °· Your pain is lasting longer than 4 weeks. °· You are suddenly losing weight without reason. °MAKE SURE YOU: °· Understand these  instructions. °· Will watch your condition. °· Will get help right away if you are not doing well or get worse. °  °This information is not intended to replace advice given to you by your health care provider. Make sure you discuss any questions you have with your health care provider. °  °Document Released: 08/03/2001 Document Revised: 04/30/2015 Document Reviewed: 12/19/2011 °Elsevier Interactive Patient Education ©2016 [employer] ° °Naproxen and naproxen sodium oral immediate-release tablets °What is this medicine? °NAPROXEN (na PROX en) is a non-steroidal anti-inflammatory drug (NSAID). It is used to reduce swelling and to treat pain. This medicine may be used for dental pain, headache, or painful monthly periods. It is also used for painful joint and muscular problems such as arthritis, tendinitis, bursitis, and gout. °This medicine may be used for other purposes; ask your health care provider or pharmacist if you have questions. °What should I tell my health care provider before I take this medicine? °They need to know if you have any of these conditions: °-asthma °-cigarette smoker °-drink more than 3 alcohol containing drinks a day °-heart disease or circulation problems such as heart failure or leg edema (fluid retention) °-high blood pressure °-kidney disease °-liver disease °-stomach bleeding or ulcers °-an unusual or allergic reaction to naproxen, aspirin, other NSAIDs, other medicines, foods, dyes, or preservatives °-pregnant or trying to get pregnant °-breast-feeding °How should I use this medicine? °Take this medicine by mouth with a glass of water. Follow the directions on the prescription label. Take it with food if your stomach gets upset. Try to not lie down for at least 10 minutes after you take it. Take your medicine at regular intervals. Do not take your medicine more often than directed. Long-term, continuous use may increase the risk of heart attack or stroke. °A special MedGuide will be  given to you by the pharmacist with each prescription and refill. Be sure to read this information carefully each time. °Talk to your pediatrician regarding the use of this medicine in children. Special care may be needed. °Overdosage: If you think you have taken too much of this medicine contact a poison control center or emergency room at once. °NOTE: This medicine is only for you. Do not share this medicine with others. °What if I miss a dose? °If you miss a dose, take it as soon as you can. If it is almost time for your next dose, take only that dose. Do not take double or extra doses. °What may interact with this medicine? °-alcohol °-aspirin °-cidofovir °-diuretics °-lithium °-methotrexate °-other drugs for inflammation like ketorolac or prednisone °-pemetrexed °-probenecid °-warfarin °This list may not describe all possible interactions. Give your health care provider a list of all the medicines, herbs, non-prescription drugs, or dietary supplements you use. Also tell them if you smoke, drink alcohol, or use illegal drugs. Some items may interact with your medicine. °What should I watch for while using this medicine? °Tell your doctor or health care professional if your pain does not get better. Talk to your doctor before taking another medicine for pain. Do   not treat yourself. °This medicine does not prevent heart attack or stroke. In fact, this medicine may increase the chance of a heart attack or stroke. The chance may increase with longer use of this medicine and in people who have heart disease. If you take aspirin to prevent heart attack or stroke, talk with your doctor or health care professional. °Do not take other medicines that contain aspirin, ibuprofen, or naproxen with this medicine. Side effects such as stomach upset, nausea, or ulcers may be more likely to occur. Many medicines available without a prescription should not be taken with this medicine. °This medicine can cause ulcers and bleeding  in the stomach and intestines at any time during treatment. Do not smoke cigarettes or drink alcohol. These increase irritation to your stomach and can make it more susceptible to damage from this medicine. Ulcers and bleeding can happen without warning symptoms and can cause death. °You may get drowsy or dizzy. Do not drive, use machinery, or do anything that needs mental alertness until you know how this medicine affects you. Do not stand or sit up quickly, especially if you are an older patient. This reduces the risk of dizzy or fainting spells. °This medicine can cause you to bleed more easily. Try to avoid damage to your teeth and gums when you brush or floss your teeth. °What side effects may I notice from receiving this medicine? °Side effects that you should report to your doctor or health care professional as soon as possible: °-black or bloody stools, blood in the urine or vomit °-blurred vision °-chest pain °-difficulty breathing or wheezing °-nausea or vomiting °-severe stomach pain °-skin rash, skin redness, blistering or peeling skin, hives, or itching °-slurred speech or weakness on one side of the body °-swelling of eyelids, throat, lips °-unexplained weight gain or swelling °-unusually weak or tired °-yellowing of eyes or skin °Side effects that usually do not require medical attention (report to your doctor or health care professional if they continue or are bothersome): °-constipation °-headache °-heartburn °This list may not describe all possible side effects. Call your doctor for medical advice about side effects. You may report side effects to FDA at 1-800-FDA-1088. °Where should I keep my medicine? °Keep out of the reach of children. °Store at room temperature between 15 and 30 degrees C (59 and 86 degrees F). Keep container tightly closed. Throw away any unused medicine after the expiration date. °NOTE: This sheet is a summary. It may not cover all possible information. If you have questions  about this medicine, talk to your doctor, pharmacist, or health care provider. °  °© 2016, [employer]. (2009-08-11 20:10:16) ° °Orphenadrine tablets °What is this medicine? °ORPHENADRINE (or FEN a dreen) helps to relieve pain and stiffness in muscles and can treat muscle spasms. °This medicine may be used for other purposes; ask your health care provider or pharmacist if you have questions. °What should I tell my health care provider before I take this medicine? °They need to know if you have any of these conditions: °-glaucoma °-heart disease °-kidney disease °-myasthenia gravis °-peptic ulcer disease °-prostate disease °-stomach problems °-an unusual or allergic reaction to orphenadrine, other medicines, foods, lactose, dyes, or preservatives °-pregnant or trying to get pregnant °-breast-feeding °How should I use this medicine? °Take this medicine by mouth with a full glass of water. Follow the directions on the prescription label. Take your medicine at regular intervals. Do not take your medicine more often than directed. Do not take more   than you are told to take. °Talk to your pediatrician regarding the use of this medicine in children. Special care may be needed. °Patients over 65 years old may have a stronger reaction and need a smaller dose. °Overdosage: If you think you have taken too much of this medicine contact a poison control center or emergency room at once. °NOTE: This medicine is only for you. Do not share this medicine with others. °What if I miss a dose? °If you miss a dose, take it as soon as you can. If it is almost time for your next dose, take only that dose. Do not take double or extra doses. °What may interact with this medicine? °-alcohol °-antihistamines °-barbiturates, like phenobarbital °-benzodiazepines °-cyclobenzaprine °-medicines for pain °-phenothiazines like chlorpromazine, mesoridazine, prochlorperazine, thioridazine °This list may not describe all possible  interactions. Give your health care provider a list of all the medicines, herbs, non-prescription drugs, or dietary supplements you use. Also tell them if you smoke, drink alcohol, or use illegal drugs. Some items may interact with your medicine. °What should I watch for while using this medicine? °Your mouth may get dry. Chewing sugarless gum or sucking hard candy, and drinking plenty of water may help. Contact your doctor if the problem does not go away or is severe. °This medicine may cause dry eyes and blurred vision. If you wear contact lenses you may feel some discomfort. Lubricating drops may help. See your eye doctor if the problem does not go away or is severe. °You may get drowsy or dizzy. Do not drive, use machinery, or do anything that needs mental alertness until you know how this medicine affects you. Do not stand or sit up quickly, especially if you are an older patient. This reduces the risk of dizzy or fainting spells. Alcohol may interfere with the effect of this medicine. Avoid alcoholic drinks. °What side effects may I notice from receiving this medicine? °Side effects that you should report to your doctor or health care professional as soon as possible: °-allergic reactions like skin rash, itching or hives, swelling of the face, lips, or tongue °-changes in vision °-difficulty breathing °-fast heartbeat or palpitations °-hallucinations °-light headedness, fainting spells °-vomiting °Side effects that usually do not require medical attention (report to your doctor or health care professional if they continue or are bothersome): °-dizziness °-drowsiness °-headache °-nausea °This list may not describe all possible side effects. Call your doctor for medical advice about side effects. You may report side effects to FDA at 1-800-FDA-1088. °Where should I keep my medicine? °Keep out of the reach of children. °This medicine may cause accidental overdose and death if it taken by other adults, children,  or pets. Mix any unused medicine with a substance like cat litter or coffee grounds. Then throw the medicine away in a sealed container like a sealed bag or a coffee can with a lid. Do not use the medicine after the expiration date. °Store at room temperature between 15 and 30 degrees C (59 and 86 degrees F). °NOTE: This sheet is a summary. It may not cover all possible information. If you have questions about this medicine, talk to your doctor, pharmacist, or health care provider. °  °© 2016, [employer]. (2013-10-05 15:35:08) ° °Acetaminophen; Oxycodone tablets °What is this medicine? °ACETAMINOPHEN; OXYCODONE (a set a MEE noe fen; ox i KOE done) is a pain reliever. It is used to treat moderate to severe pain. °This medicine may be used for other purposes; ask your health   care provider or pharmacist if you have questions. °What should I tell my health care provider before I take this medicine? °They need to know if you have any of these conditions: °-brain tumor °-Crohn's disease, inflammatory bowel disease, or ulcerative colitis °-drug abuse or addiction °-head injury °-heart or circulation problems °-if you often drink alcohol °-kidney disease or problems going to the bathroom °-liver disease °-lung disease, asthma, or breathing problems °-an unusual or allergic reaction to acetaminophen, oxycodone, other opioid analgesics, other medicines, foods, dyes, or preservatives °-pregnant or trying to get pregnant °-breast-feeding °How should I use this medicine? °Take this medicine by mouth with a full glass of water. Follow the directions on the prescription label. You can take it with or without food. If it upsets your stomach, take it with food. Take your medicine at regular intervals. Do not take it more often than directed. °Talk to your pediatrician regarding the use of this medicine in children. Special care may be needed. °Patients over 65 years old may have a stronger reaction and need a smaller  dose. °Overdosage: If you think you have taken too much of this medicine contact a poison control center or emergency room at once. °NOTE: This medicine is only for you. Do not share this medicine with others. °What if I miss a dose? °If you miss a dose, take it as soon as you can. If it is almost time for your next dose, take only that dose. Do not take double or extra doses. °What may interact with this medicine? °-alcohol °-antihistamines °-barbiturates like amobarbital, butalbital, butabarbital, methohexital, pentobarbital, phenobarbital, thiopental, and secobarbital °-benztropine °-drugs for bladder problems like solifenacin, trospium, oxybutynin, tolterodine, hyoscyamine, and methscopolamine °-drugs for breathing problems like ipratropium and tiotropium °-drugs for certain stomach or intestine problems like propantheline, homatropine methylbromide, glycopyrrolate, atropine, belladonna, and dicyclomine °-general anesthetics like etomidate, ketamine, nitrous oxide, propofol, desflurane, enflurane, halothane, isoflurane, and sevoflurane °-medicines for depression, anxiety, or psychotic disturbances °-medicines for sleep °-muscle relaxants °-naltrexone °-narcotic medicines (opiates) for pain °-phenothiazines like perphenazine, thioridazine, chlorpromazine, mesoridazine, fluphenazine, prochlorperazine, promazine, and trifluoperazine °-scopolamine °-tramadol °-trihexyphenidyl °This list may not describe all possible interactions. Give your health care provider a list of all the medicines, herbs, non-prescription drugs, or dietary supplements you use. Also tell them if you smoke, drink alcohol, or use illegal drugs. Some items may interact with your medicine. °What should I watch for while using this medicine? °Tell your doctor or health care professional if your pain does not go away, if it gets worse, or if you have new or a different type of pain. You may develop tolerance to the medicine. Tolerance means that you  will need a higher dose of the medication for pain relief. Tolerance is normal and is expected if you take this medicine for a long time. °Do not suddenly stop taking your medicine because you may develop a severe reaction. Your body becomes used to the medicine. This does NOT mean you are addicted. Addiction is a behavior related to getting and using a drug for a non-medical reason. If you have pain, you have a medical reason to take pain medicine. Your doctor will tell you how much medicine to take. If your doctor wants you to stop the medicine, the dose will be slowly lowered over time to avoid any side effects. °You may get drowsy or dizzy. Do not drive, use machinery, or do anything that needs mental alertness until you know how this medicine affects you. Do not stand   or sit up quickly, especially if you are an older patient. This reduces the risk of dizzy or fainting spells. Alcohol may interfere with the effect of this medicine. Avoid alcoholic drinks. °There are different types of narcotic medicines (opiates) for pain. If you take more than one type at the same time, you may have more side effects. Give your health care provider a list of all medicines you use. Your doctor will tell you how much medicine to take. Do not take more medicine than directed. Call emergency for help if you have problems breathing. °The medicine will cause constipation. Try to have a bowel movement at least every 2 to 3 days. If you do not have a bowel movement for 3 days, call your doctor or health care professional. °Do not take Tylenol (acetaminophen) or medicines that have acetaminophen with this medicine. Too much acetaminophen can be very dangerous. Many nonprescription medicines contain acetaminophen. Always read the labels carefully to avoid taking more acetaminophen. °What side effects may I notice from receiving this medicine? °Side effects that you should report to your doctor or health care professional as soon as  possible: °-allergic reactions like skin rash, itching or hives, swelling of the face, lips, or tongue °-breathing difficulties, wheezing °-confusion °-light headedness or fainting spells °-severe stomach pain °-unusually weak or tired °-yellowing of the skin or the whites of the eyes °Side effects that usually do not require medical attention (report to your doctor or health care professional if they continue or are bothersome): °-dizziness °-drowsiness °-nausea °-vomiting °This list may not describe all possible side effects. Call your doctor for medical advice about side effects. You may report side effects to FDA at 1-800-FDA-1088. °Where should I keep my medicine? °Keep out of the reach of children. This medicine can be abused. Keep your medicine in a safe place to protect it from theft. Do not share this medicine with anyone. Selling or giving away this medicine is dangerous and against the law. °This medicine may cause accidental overdose and death if it taken by other adults, children, or pets. Mix any unused medicine with a substance like cat litter or coffee grounds. Then throw the medicine away in a sealed container like a sealed bag or a coffee can with a lid. Do not use the medicine after the expiration date. °Store at room temperature between 20 and 25 degrees C (68 and 77 degrees F). °NOTE: This sheet is a summary. It may not cover all possible information. If you have questions about this medicine, talk to your doctor, pharmacist, or health care provider. °  °© 2016, [employer]. (2014-07-10 15:18:46) ° °

## 2016-02-16 NOTE — ED Provider Notes (Signed)
CSN: [id number]     Arrival date & time 02/16/16  0256 History   First MD Initiated Contact with Patient 02/16/16 [phone]     Chief Complaint  Patient presents with  . Back Pain     (Consider location/radiation/quality/duration/timing/severity/associated sxs/prior Treatment) Patient is a 30 y.o. female presenting with back pain. The history is provided by the patient.  Back Pain She has been having low back pain for approximately the last 10 months-Harrison's pregnancy. She has been under the care of an orthopedic surgeon. Pain has been getting worse over the last several months and is radiating down the right leg. There is intermittent numbness of her right foot. She has been treated with ibuprofen, gabapentin without relief. She was recently given a steroid burst with no relief. He denies any recent trauma. She rates pain at 8/10. Pain is worse when standing for long times. Nothing makes it better. Denies any bowel or bladder dysfunction.  Past Medical History  Diagnosis Date  . Preterm labor   . Mastitis 07/03/2015  . Breast engorgement 07/03/2015  . History of mastitis 07/14/2015  . Groin pain, chronic, right 07/14/2015    Has pain right groin area since about end of August,?swollen lymph node noted today,she said had [name] after baby born to R/O DVT    Past Surgical History  Procedure Laterality Date  . Tonsillectomy     Family History  Problem Relation Age of Onset  . Diabetes Maternal Aunt   . Hypertension Maternal Aunt   . Stroke Maternal Aunt   . Diabetes Maternal Uncle   . Hypertension Maternal Uncle   . Hypertension Paternal Aunt   . Hypertension Paternal Uncle   . Cancer Maternal Grandmother     breast  . Diabetes Maternal Grandmother   . Hypertension Maternal Grandmother   . Heart disease Maternal Grandfather   . Hypertension Maternal Grandfather   . Hypertension Paternal Grandmother   . Hypertension Paternal Grandfather   . Premature birth Daughter   . Asthma  Daughter    Social History  Substance Use Topics  . Smoking status: Never Smoker   . Smokeless tobacco: Never Used  . Alcohol Use: No   OB History    Gravida Para Term Preterm AB TAB SAB Ectopic Multiple Living   6 4 1 3 2  0 2 0 0 4     Review of Systems  Musculoskeletal: Positive for back pain.  All other systems reviewed and are negative.     Allergies  Review of patient's allergies indicates no known allergies.  Home Medications   Prior to Admission medications   Medication Sig Start Date End Date Taking? Authorizing Provider  etonogestrel (NEXPLANON) 68 MG IMPL implant 1 each by Subdermal route once.   Yes Historical Provider, MD  gabapentin (NEURONTIN) 100 MG capsule 100 MG 2 TABS 3 TIMES A DAY 02/03/16  Yes Carole Civil, MD  ibuprofen (ADVIL,MOTRIN) 800 MG tablet Take 1 tablet (800 mg total) by mouth 3 (three) times daily. Patient taking differently: Take 800 mg by mouth every 8 (eight) hours as needed for mild pain.  08/28/15  Yes Carole Civil, MD  methocarbamol (ROBAXIN) 500 MG tablet Take 1 tablet (500 mg total) by mouth 3 (three) times daily. Patient taking differently: Take 500 mg by mouth every 8 (eight) hours as needed for muscle spasms.  08/28/15   Carole Civil, MD  predniSONE (STERAPRED UNI-PAK 48 TAB) 10 MG (48) TBPK tablet Take by mouth daily. 10  MG DOSE PACK X 12 DAYS 02/03/16   Carole Civil, MD  traMADol (ULTRAM) 50 MG tablet Take 1 tablet (50 mg total) by mouth every 6 (six) hours as needed. 11/04/15   Carole Civil, MD   BP 130/84 mmHg  Pulse 98  Temp(Src) 98.3 F (36.8 C) (Oral)  Resp 16  Ht 5\' 9"  (1.753 m)  Wt 175 lb (79.379 kg)  BMI 25.83 kg/m2  SpO2 100% Physical Exam  Nursing note and vitals reviewed.  30 year old female, resting comfortably and in no acute distress. Vital signs are normal. Oxygen saturation is 100%, which is normal. Head is normocephalic and atraumatic. PERRLA, EOMI. Oropharynx is clear. Neck is  nontender and supple without adenopathy or JVD. Back is tender in the upper and lower lumbar areas with moderate bilateral paralumbar spasm. Straight leg raise is positive on the right at 60, negative on the left. There is no CVA tenderness. Lungs are clear without rales, wheezes, or rhonchi. Chest is nontender. Heart has regular rate and rhythm without murmur. Abdomen is soft, flat, nontender without masses or hepatosplenomegaly and peristalsis is normoactive. Extremities have no cyanosis or edema, full range of motion is present. Skin is warm and dry without rash. Neurologic: Mental status is normal, cranial nerves are intact, there are no motor or sensory deficits. Deep tendon reflexes are symmetric.  ED Course  Procedures (including critical care time)   MDM   Final diagnoses:  Right sided sciatica    Chronic low back pain with right-sided sciatica. Old records are reviewed and she had an MRI scan of her lumbar spine done this past week which showed no evidence of any nerve root compression. MRI of her right hip had shown degenerative changes. No indication for further workup in the ED, although, she might benefit from EMG at some point in the future. Also, given failure to respond to usual measures and essentially negative MRI, consider workup for lupus, rheumatoid arthritis, etc. For today, she is given prescriptions for naproxen, orphenadrine, and oxycodone-acetaminophen and she is referred back to her orthopedic surgeon.    Delora Fuel, MD [id number]

## 2016-02-17 ENCOUNTER — Encounter: Payer: Self-pay | Admitting: Orthopedic Surgery

## 2016-02-17 ENCOUNTER — Ambulatory Visit (INDEPENDENT_AMBULATORY_CARE_PROVIDER_SITE_OTHER): Payer: Medicaid Other | Admitting: Orthopedic Surgery

## 2016-02-17 VITALS — BP 122/72 | HR 83 | Ht 69.0 in | Wt 185.0 lb

## 2016-02-17 DIAGNOSIS — M5137 Other intervertebral disc degeneration, lumbosacral region: Secondary | ICD-10-CM | POA: Diagnosis not present

## 2016-02-17 NOTE — Progress Notes (Signed)
Patient ID: Linda Reid, female   DOB: [birth year], 30 y.o.   MRN: [id number]  MRI FOLLOW UP  Chief Complaint  Patient presents with  . Results    MRI results     HPI Linda Reid is a 30 y.o. female.    MRI OF THE Lumbar spine  Review of Systems  Constitutional: Negative for fever and chills.  Gastrointestinal: Negative for diarrhea and constipation.  Genitourinary: Negative for urgency, frequency and hematuria.    Physical Exam  No new exam  Data  Independent image interpretation the MRI showed degenerative arthritis lumbar spine no significant compressive neuropathy but L5-S1 seems to be the most significant area  The report was read as follows   IMPRESSION: Diffuse lumbar facet degeneration without significant spinal stenosis   Right foraminal narrowing L5-S1 without nerve root compression.     Electronically Signed   By: Franchot Gallo M.D.   On: 02/11/2016 10:21  MEDICAL DECISION MAKING Encounter Diagnosis  Name Primary?  . DDD (degenerative disc disease), lumbosacral Yes    WORSE   The plan is to send for esi L5-S1  Arther Abbott, MD 02/17/2016 11:09 AM

## 2016-02-17 NOTE — Patient Instructions (Signed)
Send for esi L5-S1

## 2016-02-25 ENCOUNTER — Telehealth: Payer: Self-pay | Admitting: Orthopedic Surgery

## 2016-02-25 NOTE — Telephone Encounter (Signed)
Patient requests a out of work note for this last Saturday, Sunday and for Monday.  She states that her knee was weak and swollen.  She states she went to [facility] ER.  Is it okay to give an OOW note for those days?

## 2016-02-25 NOTE — Telephone Encounter (Signed)
yes

## 2016-02-26 ENCOUNTER — Encounter: Payer: Self-pay | Admitting: Orthopedic Surgery

## 2016-03-03 ENCOUNTER — Ambulatory Visit
Admission: RE | Admit: 2016-03-03 | Discharge: 2016-03-03 | Disposition: A | Discharge status: Home / Self Care | Payer: Medicaid Other | Source: Ambulatory Visit | Attending: Orthopedic Surgery | Admitting: Orthopedic Surgery

## 2016-03-03 DIAGNOSIS — M5137 Other intervertebral disc degeneration, lumbosacral region: Secondary | ICD-10-CM

## 2016-03-03 DIAGNOSIS — G970 Cerebrospinal fluid leak from spinal puncture: Secondary | ICD-10-CM

## 2016-03-03 HISTORY — DX: Cerebrospinal fluid leak from spinal puncture: G97.0

## 2016-03-03 MED ORDER — IOPAMIDOL (ISOVUE-M 200) INJECTION 41%
1.0000 mL | Freq: Once | INTRAMUSCULAR | Status: AC
  Administered 2016-03-03: 1 mL via EPIDURAL

## 2016-03-03 MED ORDER — METHYLPREDNISOLONE ACETATE 40 MG/ML INJ SUSP (RADIOLOG
120.0000 mg | Freq: Once | INTRAMUSCULAR | Status: AC
  Administered 2016-03-03: 120 mg via EPIDURAL

## 2016-03-03 NOTE — Discharge Instructions (Signed)

## 2016-03-05 ENCOUNTER — Telehealth: Payer: Self-pay

## 2016-03-05 NOTE — Telephone Encounter (Signed)
I returned patient's call to [name] regarding severe positional headache, neck pain and positional nausea (when upright).  She had an LESI here 03/03/16 with no apparent complications, though I told her this sounded like a spinal headache based on her symptoms.  I discussed what a spinal headache is and asked her to be on strict bedrest for the next 24 hours, getting up only to use the bathroom and for ten minutes to eat.  She already is forcing fluids.  She understands to add caffeinated beverages as much as possible.  I explained the Epidural Blood Patch procedure and told her I would work on getting an order for one from Dr. Aline Brochure in case her headache wasn't better by Monday, 03/08/16.  She understands the ED's will not do this procedure.  I have left a message with her boss, Delle Reining [phone]) with [employer] to explain this complication and that she needs to be of work Saturday night and perhaps Sunday night.  I will fax a note to this regard to [phone].  We will follow up with her on Monday, 03/08/16.  Brita Romp, RN

## 2016-03-08 ENCOUNTER — Other Ambulatory Visit: Payer: Self-pay | Admitting: Orthopedic Surgery

## 2016-03-08 ENCOUNTER — Telehealth: Payer: Self-pay

## 2016-03-08 ENCOUNTER — Telehealth: Payer: Self-pay | Admitting: Orthopedic Surgery

## 2016-03-08 ENCOUNTER — Ambulatory Visit
Admission: RE | Admit: 2016-03-08 | Discharge: 2016-03-08 | Disposition: A | Discharge status: Home / Self Care | Payer: Medicaid Other | Source: Ambulatory Visit | Attending: Orthopedic Surgery | Admitting: Orthopedic Surgery

## 2016-03-08 DIAGNOSIS — G971 Other reaction to spinal and lumbar puncture: Secondary | ICD-10-CM

## 2016-03-08 MED ORDER — IOPAMIDOL (ISOVUE-M 200) INJECTION 41%
1.0000 mL | Freq: Once | INTRAMUSCULAR | Status: AC
  Administered 2016-03-08: 1 mL via EPIDURAL

## 2016-03-08 NOTE — Telephone Encounter (Signed)
Sonia Baller from [facility] called and left two messages about this patient having headaches, neck pain and nausea, since having a lumbar epidural on Wednesday 03/03/16. Sonia Baller is asking for a order for a bloodpatch.   Please call her at [phone]  Fax: [phone]

## 2016-03-08 NOTE — Telephone Encounter (Signed)
I called Linda Reid to see how her positional headache is after an LESI her 03/03/16.  She states it is no better.  I left a message on Dr. Laure Kidney Harrison's office voice mail for blood patch order.  Brita Romp, RN

## 2016-03-08 NOTE — Discharge Instructions (Signed)

## 2016-03-08 NOTE — Telephone Encounter (Signed)
ROUTING TO DR HARRISON TO ADVISE 

## 2016-03-08 NOTE — Telephone Encounter (Signed)
DONE

## 2016-03-26 ENCOUNTER — Telehealth: Payer: Self-pay

## 2016-03-26 NOTE — Telephone Encounter (Signed)
Patient called to report ongoing headache, mostly at the base of her skull down through her neck, since having an LESI 03/03/16 and an Epidural Blood Patch 03/08/16 here.  Essentially no relief from her symptoms from the blood patch.  Loss of appetite and nausea, too.  We discussed the possibilities of a chemical irritation of the meninges versus the need for a second blood patch versus just waiting it out a bit longer.  She will call Dr. Aline Brochure to share her symptoms and for further treatment.  jkl

## 2016-03-27 ENCOUNTER — Emergency Department (HOSPITAL_COMMUNITY)
Admission: EM | Admit: 2016-03-27 | Discharge: 2016-03-27 | Disposition: A | Discharge status: Home / Self Care | Payer: Medicaid Other | Attending: Emergency Medicine | Admitting: Emergency Medicine

## 2016-03-27 ENCOUNTER — Encounter (HOSPITAL_COMMUNITY): Payer: Self-pay

## 2016-03-27 ENCOUNTER — Emergency Department (HOSPITAL_COMMUNITY): Payer: Medicaid Other

## 2016-03-27 DIAGNOSIS — Z7982 Long term (current) use of aspirin: Secondary | ICD-10-CM | POA: Insufficient documentation

## 2016-03-27 DIAGNOSIS — R519 Headache, unspecified: Secondary | ICD-10-CM

## 2016-03-27 DIAGNOSIS — Z79899 Other long term (current) drug therapy: Secondary | ICD-10-CM | POA: Insufficient documentation

## 2016-03-27 DIAGNOSIS — R51 Headache: Secondary | ICD-10-CM | POA: Insufficient documentation

## 2016-03-27 DIAGNOSIS — G43909 Migraine, unspecified, not intractable, without status migrainosus: Secondary | ICD-10-CM | POA: Diagnosis present

## 2016-03-27 LAB — CBC WITH DIFFERENTIAL/PLATELET
Basophils Absolute: 0 10*3/uL (ref 0.0–0.1)
Basophils Relative: 0 %
EOS PCT: 1 %
Eosinophils Absolute: 0.1 10*3/uL (ref 0.0–0.7)
HEMATOCRIT: 42.1 % (ref 36.0–46.0)
Hemoglobin: 13.9 g/dL (ref 12.0–15.0)
LYMPHS ABS: 1.6 10*3/uL (ref 0.7–4.0)
LYMPHS PCT: 29 %
MCH: 31.1 pg (ref 26.0–34.0)
MCHC: 33 g/dL (ref 30.0–36.0)
MCV: 94.2 fL (ref 78.0–100.0)
MONO ABS: 0.5 10*3/uL (ref 0.1–1.0)
MONOS PCT: 10 %
NEUTROS ABS: 3.3 10*3/uL (ref 1.7–7.7)
Neutrophils Relative %: 60 %
PLATELETS: 236 10*3/uL (ref 150–400)
RBC: 4.47 MIL/uL (ref 3.87–5.11)
RDW: 12.2 % (ref 11.5–15.5)
WBC: 5.5 10*3/uL (ref 4.0–10.5)

## 2016-03-27 LAB — BASIC METABOLIC PANEL
ANION GAP: 4 — AB (ref 5–15)
BUN: 14 mg/dL (ref 6–20)
CALCIUM: 8.6 mg/dL — AB (ref 8.9–10.3)
CO2: 25 mmol/L (ref 22–32)
Chloride: 109 mmol/L (ref 101–111)
Creatinine, Ser: 0.93 mg/dL (ref 0.44–1.00)
GFR calc Af Amer: >60 mL/min (ref >60)
GLUCOSE: 93 mg/dL (ref 65–99)
Potassium: 3.2 mmol/L — ABNORMAL LOW (ref 3.5–5.1)
Sodium: 138 mmol/L (ref 135–145)

## 2016-03-27 LAB — PREGNANCY, URINE: Preg Test, Ur: NEGATIVE

## 2016-03-27 MED ORDER — PROCHLORPERAZINE EDISYLATE 5 MG/ML IJ SOLN
10.0000 mg | Freq: Once | INTRAMUSCULAR | Status: AC
  Administered 2016-03-27: 10 mg via INTRAVENOUS
  Filled 2016-03-27: qty 2

## 2016-03-27 MED ORDER — KETOROLAC TROMETHAMINE 30 MG/ML IJ SOLN
30.0000 mg | Freq: Once | INTRAMUSCULAR | Status: AC
  Administered 2016-03-27: 30 mg via INTRAVENOUS
  Filled 2016-03-27: qty 1

## 2016-03-27 MED ORDER — SODIUM CHLORIDE 0.9 % IV BOLUS (SEPSIS)
1000.0000 mL | Freq: Once | INTRAVENOUS | Status: AC
  Administered 2016-03-27: 1000 mL via INTRAVENOUS

## 2016-03-27 MED ORDER — DEXAMETHASONE SODIUM PHOSPHATE 4 MG/ML IJ SOLN
10.0000 mg | Freq: Once | INTRAMUSCULAR | Status: AC
  Administered 2016-03-27: 10 mg via INTRAVENOUS
  Filled 2016-03-27: qty 3

## 2016-03-27 MED ORDER — DIPHENHYDRAMINE HCL 50 MG/ML IJ SOLN
25.0000 mg | Freq: Once | INTRAMUSCULAR | Status: AC
  Administered 2016-03-27: 25 mg via INTRAVENOUS
  Filled 2016-03-27: qty 1

## 2016-03-27 NOTE — Discharge Instructions (Signed)
Follow-up with your doctor on Monday for re-evaluation.  Continue ibuprofen and tylenol for pain.  Return for worsening symptoms, including escalating pain, confusion, new vision or speech changes, numbness/weakness, difficulty walking or any other symptoms concerning to you.

## 2016-03-27 NOTE — ED Notes (Signed)
Pt alert & oriented x4, stable gait. Patient given discharge instructions, paperwork & prescription(s). Patient  instructed to stop at the registration desk to finish any additional paperwork. Patient verbalized understanding. Pt left department w/ no further questions. 

## 2016-03-27 NOTE — ED Notes (Signed)
Pt returned from ct

## 2016-03-27 NOTE — ED Triage Notes (Signed)
Complains of headache and neck pain x 2.5 weeks. Had injections in back July 11th and was told they went to deep and spinal fluid leaked. Had blood patch July 17th. Reports of no relief.

## 2016-03-27 NOTE — ED Notes (Signed)
MD at bedside. 

## 2016-03-27 NOTE — ED Notes (Signed)
Patient transported to CT 

## 2016-03-27 NOTE — ED Provider Notes (Signed)
[facility] DEPT Provider Note   CSN: [id number] Arrival date & time: 03/27/16  1929  First Provider Contact:   First MD Initiated Contact with Patient 03/27/16 2013      By signing my name below, I, Higinio Plan, attest that this documentation has been prepared under the direction and in the presence of Forde Dandy, MD . Electronically Signed: Higinio Plan, Scribe. 03/27/2016. 8:26 PM.  History   Chief Complaint Chief Complaint  Patient presents with  . Migraine   The history is provided by the patient. No language interpreter was used.   HPI Comments: Linda Reid is a 30 y.o. female who presents to the Emergency Department complaining of gradually worsening, constant, 8/10, left-sided headache and neck pain that began ~2.5 weeks PTA. Pt reports her neck pain begins in the back of her head and radiates down between her shoulder blades. She notes no alleviating factors or positions that relieve her symptoms. Pt states she has taken Excedrin and tylenol with no relief. She reports her pain initially began after receiving an epidural for back pain due to a pinched sciatic nerve and lumbar disc on 7/11; she notes no relief of back pain with this injection. She states she went home after the injection and began to experience severe headaches and the sensation that she was about to "pass out." She notes she was told by a nurse that the spinal tap may have gone too deep in which her spinal fluid began to leak; she reports she received a blood patch from [facility] on 7/17. She states she initially felt minor relief of her headache but the pain never completely disappeared. She states she is still experiencing a headache now in the ED. Pt reports associated loss of appetite, nausea when eating food, difficulty sleeping, "dizzy spells," fatigue and mild photophobia. She denies vomiting, double vision or loss of vision, slurred speech, numbness or weakness in her extremities and hx of headaches  or migraines. She also denies use of contacts and any other major medical problems.   Past Medical History:  Diagnosis Date  . Breast engorgement 07/03/2015  . Groin pain, chronic, right 07/14/2015   Has pain right groin area since about end of August,?swollen lymph node noted today,she said had [name] after baby born to R/O DVT   . History of mastitis 07/14/2015  . Mastitis 07/03/2015  . Preterm labor     Patient Active Problem List   Diagnosis Date Noted  . Nexplanon insertion 08/26/2015  . History of preterm delivery 08/26/2015  . History of chlamydia 08/26/2015  . Postpartum/situational depression 08/06/2015  . History of mastitis 07/14/2015  . Groin pain, chronic, right 07/14/2015   Past Surgical History:  Procedure Laterality Date  . TONSILLECTOMY      OB History    Gravida Para Term Preterm AB Living   6 4 1 3 2 4    SAB TAB Ectopic Multiple Live Births   2 0 0 0 4     Home Medications    Prior to Admission medications   Medication Sig Start Date End Date Taking? Authorizing Provider  aspirin-acetaminophen-caffeine (EXCEDRIN MIGRAINE) [phone] MG tablet Take 1-2 tablets by mouth daily as needed for headache or migraine.   Yes Historical Provider, MD  etonogestrel (NEXPLANON) 68 MG IMPL implant 1 each by Subdermal route once.   Yes Historical Provider, MD  gabapentin (NEURONTIN) 100 MG capsule 100 MG 2 TABS 3 TIMES A DAY Patient taking differently: Take 200 mg by  mouth 3 (three) times daily. 100 MG 2 TABS 3 TIMES A DAY 02/03/16  Yes Carole Civil, MD  naproxen (NAPROSYN) 500 MG tablet Take 1 tablet (500 mg total) by mouth 2 (two) times daily. 0000000  Yes Delora Fuel, MD  orphenadrine (NORFLEX) 100 MG tablet Take 1 tablet (100 mg total) by mouth 2 (two) times daily. Patient not taking: Reported on 03/27/2016 0000000   Delora Fuel, MD    Family History Family History  Problem Relation Age of Onset  . Cancer Maternal Grandmother     breast  . Diabetes Maternal  Grandmother   . Hypertension Maternal Grandmother   . Heart disease Maternal Grandfather   . Hypertension Maternal Grandfather   . Hypertension Paternal Grandmother   . Hypertension Paternal Grandfather   . Premature birth Daughter   . Asthma Daughter   . Diabetes Maternal Aunt   . Hypertension Maternal Aunt   . Stroke Maternal Aunt   . Diabetes Maternal Uncle   . Hypertension Maternal Uncle   . Hypertension Paternal Aunt   . Hypertension Paternal Uncle     Social History Social History  Substance Use Topics  . Smoking status: Never Smoker  . Smokeless tobacco: Never Used  . Alcohol use No     Allergies   Review of patient's allergies indicates no known allergies.  Review of Systems Review of Systems 10/14 systems reviewed and all are negative for acute change except as noted in the HPI.  Physical Exam Updated Vital Signs BP 126/83 (BP Location: Left Arm)   Pulse 104   Temp 99 F (37.2 C) (Oral)   Resp 20   Ht 5\' 9"  (1.753 m)   Wt 178 lb 4 oz (80.9 kg)   LMP 07/28/2015   SpO2 100%   BMI 26.32 kg/m   Physical Exam Physical Exam  Nursing note and vitals reviewed. Constitutional: Well developed, well nourished, non-toxic, and in no acute distress Head: Normocephalic and atraumatic.  Mouth/Throat: Oropharynx is clear and moist.  Neck: Normal range of motion. Neck supple.  Cardiovascular: Normal rate and regular rhythm.   Pulmonary/Chest: Effort normal and breath sounds normal.  Abdominal: Soft. There is no tenderness. There is no rebound and no guarding.  Musculoskeletal: Normal range of motion.  Skin: Skin is warm and dry.  Psychiatric: Cooperative Neurological:  Alert, oriented to person, place, time, and situation. Memory grossly in tact. Fluent speech. No dysarthria or aphasia.  Cranial nerves: VF are full. Pupils are symmetric, and reactive to light. EOMI without nystagmus. No gaze deviation. Facial muscles symmetric with activation. Sensation to light  touch over face in tact bilaterally. Hearing grossly in tact. Palate elevates symmetrically. Head turn and shoulder shrug are intact. Tongue midline.  Reflexes defered.  Muscle bulk and tone normal. No pronator drift. Moves all extremities symmetrically. Some drift against gravity involving the right lower extremity due to chronic sciatica  Sensation to light touch diminished in RLE 2/2 chronic sciatica. Intact in remainder 3 extremities Coordination reveals no dysmetria with finger to nose.  ED Treatments / Results  Labs (all labs ordered are listed, but only abnormal results are displayed) Labs Reviewed  BASIC METABOLIC PANEL - Abnormal; Notable for the following:       Result Value   Potassium 3.2 (*)    Calcium 8.6 (*)    Anion gap 4 (*)    All other components within normal limits  CBC WITH DIFFERENTIAL/PLATELET  PREGNANCY, URINE   EKG  EKG Interpretation  None       Radiology Ct Head Wo Contrast  Result Date: 03/27/2016 CLINICAL DATA:  29 year old female with headaches. EXAM: CT HEAD WITHOUT CONTRAST TECHNIQUE: Contiguous axial images were obtained from the base of the skull through the vertex without intravenous contrast. COMPARISON:  None. FINDINGS: The ventricles and the sulci are appropriate in size for the patient's age. There is no intracranial hemorrhage. No midline shift or mass effect identified. The gray-white matter differentiation is preserved. The visualized paranasal sinuses and mastoid air cells are well aerated. The calvarium is intact. IMPRESSION: No acute intracranial pathology. Electronically Signed   By: Anner Crete M.D.   On: 03/27/2016 21:21   Procedures Procedures  DIAGNOSTIC STUDIES:  Oxygen Saturation is 100% on RA, normal by my interpretation.    COORDINATION OF CARE:  8:25 PM Discussed treatment plan, which includes CT head and migraine cocktail with pt at bedside and pt agreed to plan.  Medications Ordered in ED Medications  sodium  chloride 0.9 % bolus 1,000 mL (0 mLs Intravenous Stopped 03/27/16 2241)  dexamethasone (DECADRON) injection 10 mg (10 mg Intravenous Given 03/27/16 2045)  ketorolac (TORADOL) 30 MG/ML injection 30 mg (30 mg Intravenous Given 03/27/16 2044)  prochlorperazine (COMPAZINE) injection 10 mg (10 mg Intravenous Given 03/27/16 2040)  diphenhydrAMINE (BENADRYL) injection 25 mg (25 mg Intravenous Given 03/27/16 2042)   Initial Impression / Assessment and Plan / ED Course  I have reviewed the triage vital signs and the nursing notes.  Pertinent labs & imaging results that were available during my care of the patient were reviewed by me and considered in my medical decision making (see chart for details).  Clinical Course    P/w persistent post-LP headache after blood patch. She is well appearing and in no acute distress. Vital signs normal. Neuro exam in tact. CT head normal. Given IVF, decadron, compazine, benadryl, and toradol. On re-evaluation, with resolution of her headache. At this time, no suspecting serious intracranial process, such as infection, hemorrhage, thrombosis or other emergent processes. She will continue supportive care for home. Strict return and follow-up instructions reviewed. She expressed understanding of all discharge instructions and felt comfortable with the plan of care.   I personally performed the services described in this documentation, which was scribed in my presence. The recorded information has been reviewed and is accurate.   Final Clinical Impressions(s) / ED Diagnoses   Final diagnoses:  Acute nonintractable headache, unspecified headache type   New Prescriptions Discharge Medication List as of 03/27/2016 10:46 PM       Forde Dandy, MD 03/28/16 [phone]

## 2016-04-06 ENCOUNTER — Telehealth: Payer: Self-pay | Admitting: Orthopedic Surgery

## 2016-04-06 NOTE — Telephone Encounter (Signed)
ROUTING TO DR HARRISON TO ADVISE 

## 2016-04-06 NOTE — Telephone Encounter (Signed)
Patient called and stated that she has an epidural injection on 03-03-16.  She continues to have minor headaches, loss of appetite and a painful neck.  She wants to know what she should do.  Please call patient at [phone]

## 2016-04-06 NOTE — Telephone Encounter (Signed)
Who did the procedure?  Please call them.

## 2016-04-06 NOTE — Telephone Encounter (Signed)
Patient informed. 

## 2016-05-18 ENCOUNTER — Encounter: Payer: Self-pay | Admitting: Orthopedic Surgery

## 2016-05-18 ENCOUNTER — Ambulatory Visit (INDEPENDENT_AMBULATORY_CARE_PROVIDER_SITE_OTHER): Payer: Medicaid Other | Admitting: Orthopedic Surgery

## 2016-05-18 VITALS — BP 116/84 | HR 78 | Wt 172.0 lb

## 2016-05-18 DIAGNOSIS — M5441 Lumbago with sciatica, right side: Secondary | ICD-10-CM

## 2016-05-18 MED ORDER — NABUMETONE 500 MG PO TABS: 500.0000 mg | ORAL_TABLET | Freq: Two times a day (BID) | ORAL | 5 refills | Status: DC

## 2016-05-18 NOTE — Progress Notes (Signed)
Patient ID: Linda Reid, female   DOB: [birth year], 30 y.o.   MRN: [id number]  Chief Complaint  Patient presents with  . Follow-up    back, s/p esi    HPI Linda Reid is a 30 y.o. female.   HPI  30 year old female an MRI which showed arthritis lumbar spine without compressive neuropathy with right foraminal narrowing at L5-S1. She failed initial NSAID therapy and gabapentin physical therapy and was sent for epidural. Developed spinal headache. Had spinal patch. Presents with continued pain right leg and persistent headache  Review of Systems Review of Systems  Headache  Physical Exam BP 116/84   Pulse 78   Wt 172 lb (78 kg)   BMI 25.40 kg/m    Patient will be sent back to [facility] with appropriate referral to evaluate for headache status post patch  Start Relafen 500 mg twice a day for persistent back and right lower leg pain  I do not recommend repeat epidural sent she has some much trouble

## 2016-05-19 ENCOUNTER — Telehealth: Payer: Self-pay | Admitting: Orthopedic Surgery

## 2016-05-19 ENCOUNTER — Other Ambulatory Visit: Payer: Self-pay | Admitting: *Deleted

## 2016-05-19 NOTE — Telephone Encounter (Signed)
Order faxed as requested

## 2016-05-19 NOTE — Telephone Encounter (Signed)
Call received from [facility], direct ph# [phone], [place], per Dr Jobe Igo - as per his conversation with Dr Aline Brochure yesterday, 05/18/16; states received the referral/orders, and needs it to be updated to read:   "RADIOLOGY EVALUATION Vs BLOOD PATCH".  The appointment is pending.  Please fax to [phone]

## 2016-05-20 ENCOUNTER — Other Ambulatory Visit: Payer: Self-pay | Admitting: Orthopedic Surgery

## 2016-05-20 DIAGNOSIS — G4489 Other headache syndrome: Secondary | ICD-10-CM

## 2016-05-28 ENCOUNTER — Other Ambulatory Visit: Payer: Self-pay | Admitting: Orthopedic Surgery

## 2016-05-28 ENCOUNTER — Ambulatory Visit
Admission: RE | Admit: 2016-05-28 | Discharge: 2016-05-28 | Disposition: A | Discharge status: Home / Self Care | Payer: Medicaid Other | Source: Ambulatory Visit | Attending: Orthopedic Surgery | Admitting: Orthopedic Surgery

## 2016-05-28 DIAGNOSIS — G4489 Other headache syndrome: Secondary | ICD-10-CM

## 2016-05-28 MED ORDER — IOPAMIDOL (ISOVUE-M 200) INJECTION 41%
1.0000 mL | Freq: Once | INTRAMUSCULAR | Status: AC
  Administered 2016-05-28: 1 mL via EPIDURAL

## 2016-05-28 NOTE — Discharge Instructions (Signed)

## 2016-05-28 NOTE — Progress Notes (Signed)
1348 20 cc's of blood obtained from right [facility] for blood patch. Pt tolerated blood draw well and site is unremarkable.

## 2016-06-16 ENCOUNTER — Encounter: Payer: Self-pay | Admitting: Orthopedic Surgery

## 2016-06-16 ENCOUNTER — Ambulatory Visit (INDEPENDENT_AMBULATORY_CARE_PROVIDER_SITE_OTHER): Payer: Medicaid Other | Admitting: Orthopedic Surgery

## 2016-06-16 DIAGNOSIS — M5137 Other intervertebral disc degeneration, lumbosacral region: Secondary | ICD-10-CM | POA: Diagnosis not present

## 2016-06-16 MED ORDER — INDOMETHACIN 25 MG PO CAPS: 25.0000 mg | ORAL_CAPSULE | Freq: Three times a day (TID) | ORAL | 2 refills | Status: DC

## 2016-06-16 NOTE — Progress Notes (Signed)
Patient ID: LANDY KANGAS, female   DOB: Oct 29, 1985, 30 y.o.   MRN: [id number]  Chief Complaint  Patient presents with  . Follow-up    back and leg pain    HPI LAEKYN WOOLFORD is a 30 y.o. female.   HPI  Patient had another patch placed did not help. She was referred to neurology for headaches  Review of Systems Review of Systems  Right lower back pain radiating down right leg Physical Exam There were no vitals taken for this visit.   Physical Exam  Prior imaging studies show degenerative disc changes at L4-5 and L5-S1  L1-2:  Mild facet degeneration without spinal stenosis   L2-3:  Mild facet degeneration without spinal stenosis.  Normal disc   L3-4: Mild disc bulging and mild facet degeneration without spinal stenosis.   L4-5: Mild disc bulging and mild facet degeneration. No disc protrusion or spinal stenosis. Neural foramina patent.   L5-S1: Mild disc bulging on the right. Bilateral facet arthropathy. Mild right foraminal narrowing without nerve root compression.   IMPRESSION: Diffuse lumbar facet degeneration without significant spinal stenosis   Right foraminal narrowing L5-S1 without nerve root compression.  Epidurals did not help we tried several anti-inflammatories along with physical therapy she is on gabapentin and Norflex.  Recommend Indocin 25 mg 3 times a day continue Norflex and gabapentin follow-up in 4 weeks

## 2016-06-16 NOTE — Patient Instructions (Signed)
ADD INDOCIN 25 MG 3 X A DAY

## 2016-06-18 ENCOUNTER — Encounter: Payer: Self-pay | Admitting: Neurology

## 2016-06-18 ENCOUNTER — Ambulatory Visit (INDEPENDENT_AMBULATORY_CARE_PROVIDER_SITE_OTHER): Payer: Medicaid Other | Admitting: Neurology

## 2016-06-18 VITALS — BP 103/67 | HR 78 | Ht 69.0 in | Wt 172.0 lb

## 2016-06-18 DIAGNOSIS — M5416 Radiculopathy, lumbar region: Secondary | ICD-10-CM | POA: Diagnosis not present

## 2016-06-18 DIAGNOSIS — R42 Dizziness and giddiness: Secondary | ICD-10-CM | POA: Diagnosis not present

## 2016-06-18 DIAGNOSIS — G8929 Other chronic pain: Secondary | ICD-10-CM

## 2016-06-18 DIAGNOSIS — G9600 Cerebrospinal fluid leak, unspecified: Secondary | ICD-10-CM

## 2016-06-18 DIAGNOSIS — R51 Headache with orthostatic component, not elsewhere classified: Secondary | ICD-10-CM

## 2016-06-18 DIAGNOSIS — H539 Unspecified visual disturbance: Secondary | ICD-10-CM

## 2016-06-18 DIAGNOSIS — M5136 Other intervertebral disc degeneration, lumbar region: Secondary | ICD-10-CM

## 2016-06-18 DIAGNOSIS — G9389 Other specified disorders of brain: Secondary | ICD-10-CM | POA: Diagnosis not present

## 2016-06-18 DIAGNOSIS — G96810 Intracranial hypotension, unspecified: Secondary | ICD-10-CM

## 2016-06-18 DIAGNOSIS — M542 Cervicalgia: Secondary | ICD-10-CM

## 2016-06-18 DIAGNOSIS — M5137 Other intervertebral disc degeneration, lumbosacral region: Secondary | ICD-10-CM

## 2016-06-18 MED ORDER — FROVATRIPTAN SUCCINATE 2.5 MG PO TABS: 2.5000 mg | ORAL_TABLET | ORAL | 3 refills | Status: DC | PRN

## 2016-06-18 NOTE — Patient Instructions (Addendum)
Remember to drink plenty of fluid, eat healthy meals and do not skip any meals. Try to eat protein with a every meal and eat a healthy snack such as fruit or nuts in between meals. Try to keep a regular sleep-wake schedule and try to exercise daily, particularly in the form of walking, 20-30 minutes a day, if you can.   As far as your medications are concerned, I would like to suggest: Fluids, caffeine, frova can take it in the mornings as needed  As far as diagnostic testing: MRI of the brain, MRI cervical spine, CT myelogram  I would like to see you back in 6 weeks, sooner if we need to. Please call us with any interim questions, concerns, problems, updates or refill requests.   Our phone number is [phone]. We also have an after hours call service for urgent matters and there is a physician on-call for urgent questions. For any emergencies you know to call 911 or go to the nearest emergency room  Frovatriptan tablets What is this medicine? FROVATRIPTAN (froe va TRIP tan) is used to treat migraines with or without aura. An aura is a strange feeling or visual disturbance that warns you of an attack. It is not used to prevent migraines. This medicine may be used for other purposes; ask your health care provider or pharmacist if you have questions. What should I tell my health care provider before I take this medicine? They need to know if you have any of these conditions: -bowel disease or colitis -diabetes -family history of heart disease -fast or irregular heart beat -heart or blood vessel disease, angina (chest pain), or previous heart attack -high blood pressure -high cholesterol -history of stroke, transient ischemic attacks (TIAs or mini-strokes), or intracranial bleeding -kidney or liver disease -overweight -poor circulation -postmenopausal or surgical removal of uterus and ovaries -Raynaud's disease -seizure disorder -an unusual or allergic reaction to frovatriptan, other  medicines, foods, dyes, or preservatives -pregnant or trying to get pregnant -breast-feeding How should I use this medicine? Take this medicine by mouth with a glass of water. Follow the directions on the prescription label. This medicine is taken at the first symptoms of a migraine. It is not for everyday use. If your migraine headache returns after one dose, you can take another dose as directed. You must allow at least 2 hours between doses, and do not take more than 3 tablets (7.5 mg) in 24 hours. If there is no improvement at all after the first dose, do not take a second dose without talking to your doctor or health care professional. Do not take your medicine more often than directed. Talk to your pediatrician regarding the use of this medicine in children. Special care may be needed. Overdosage: If you think you have taken too much of this medicine contact a poison control center or emergency room at once. NOTE: This medicine is only for you. Do not share this medicine with others. What if I miss a dose? This does not apply; this medicine is not for regular use. What may interact with this medicine? Do not take this medicine with any of the following medicines: -amphetamine, dextroamphetamine or cocaine -dihydroergotamine, ergotamine, ergoloid mesylates, methysergide, or ergot-type medication - do not take within 24 hours of taking frovatriptan -feverfew -MAOIs like Carbex, Eldepryl, Marplan, Nardil, and Parnate - do not take frovatriptan within 2 weeks of stopping MAOI therapy -other migraine medicines like almotriptan, eletriptan, naratriptan, rizatriptan, zolmitriptan - do not take within 24 hours  of taking frovatriptan -tryptophan This medicine may also interact with the following medications: -female hormones, like estrogens or progestins and birth control pills -medicines for mental depression, anxiety or mood problems -propranolol This list may not describe all possible  interactions. Give your health care provider a list of all the medicines, herbs, non-prescription drugs, or dietary supplements you use. Also tell them if you smoke, drink alcohol, or use illegal drugs. Some items may interact with your medicine. What should I watch for while using this medicine? Only take this medicine for a migraine headache. Take it if you get warning symptoms or at the start of a migraine attack. It is not for regular use to prevent migraine attacks. You may get drowsy or dizzy. Do not drive, use machinery, or do anything that needs mental alertness until you know how this medicine affects you. To reduce dizzy or fainting spells, do not sit or stand up quickly, especially if you are an older patient. Alcohol can increase drowsiness, dizziness and flushing. Avoid alcoholic drinks. Smoking cigarettes may increase the risk of heart-related side effects from using this medicine. If you take migraine medicines for 10 or more days a month, your migraines may get worse. Keep a diary of headache days and medicine use. Contact your healthcare professional if your migraine attacks occur more frequently. What side effects may I notice from receiving this medicine? Side effects that you should report to your doctor or health care professional as soon as possible: -allergic reactions like skin rash, itching or hives, swelling of the face, lips, or tongue -chest or throat pain, tightness -fast, slow, or irregular heart beat -increased or decreased blood pressure -loss of vision or vision changes -seizures -severe stomach pain and cramping, bloody diarrhea -shortness of breath, wheezing, or difficulty breathing -tingling, pain, or numbness in the face, hands or feet Side effects that usually do not require medical attention (report to your doctor or health care professional if they continue or are bothersome): -drowsiness -feeling warm, flushing, or redness of the face -headache -muscle  pain or cramps -nausea, vomiting, diarrhea or stomach upset -tiredness or weakness This list may not describe all possible side effects. Call your doctor for medical advice about side effects. You may report side effects to FDA at 1-800-FDA-1088. Where should I keep my medicine? Keep out of the reach of children. Store at room temperature between 20 and 25 degrees C (68 and 77 degrees F). Protect from light and moisture. Throw away any unused medicine after the expiration date. NOTE: This sheet is a summary. It may not cover all possible information. If you have questions about this medicine, talk to your doctor, pharmacist, or health care provider.    2016, Elsevier/Gold Standard. (2013-04-10 10:06:08)

## 2016-06-18 NOTE — Progress Notes (Signed)
[facility]    Provider:  Dr Jaynee Eagles Referring Provider: Dr. Jobe Igo Primary Care Physician:  MUSE,ROCHELLE D., PA-C  CC:  headache  HPI:  Linda Reid is a 30 y.o. female here as a referral from Dr. Darleene Cleaver for headaches. Past medical history of  depressive disorder, preterm labor, abdominal pain. Patient has ongoing headaches since epidural steroid injection.Never had headaches before ESI. ESI was in July 2017 on a Wednesday, the next day had a headaches, worsening, it was excruciating, it was better laying down and worse sitting or standing definitely positional, she felt like she was going to pass out. It was mostly on the back of the head towards the neck. She had a blood patch on the 17th which helped but did not resolved the headache. She started having a lot of neck pain, stiffness and aching, never had double vision or facial weakness (no cranial neuropathies), headache made her feel fatigued. The headache was still there albeit a little better but more random, still every day, wasn;t as severe but was still having them every day. Headache continued for months without improvement, she eventually had a second blood patch without anymore improvement. Currently she still has neck pain, generalized weakness, heaviness of the head centered at the neck and mid back but no arm weakness or radicular symptoms. Headache now is worse when she is up and moving around for a while, still better with laying down, less severe positionally but there still is a definitive positional component. Throughout the day it is more of a pressure in a band around the head, she has had some blurry vision with the headaches,dizziness,  headaches are daily, waxing and waning worse during the day when upright mostly better in the morning but still there. On average is a 8/10 pain and discomfort. She had to stop coaching volleyball, headaches significantly affecting her daily life. Her profession is  security. OTC meds have not helped, ibuprofen/tylenol,excedrin, indomethacin. Also continued LBP with radicular symptoms, ESI did not improve her L5/S1 radicular pain and this is making her security job difficult, no other associated symptoms, modifying factors or focal deficits. + nausea. No current vomiting, diplopia (but does endorse vision changes blurred vision), facial weakness, photophobia.    Reviewed notes, labs and imaging from outside physicians, which showed:  Patient had a successful epidural steroid injection 03/03/2016 where Depo-Medrol and lidocaine via an interlaminar approach was performed on the right L5-S1 which was successful the procedure was well tolerated and the patient was discharged 30 minutes following the injection and good condition. Patient developed a post epidural headache and that a technically successful lumbar epidural blood patch on the right at L5-S1 on 03/08/2016. On 05/28/2016 a lumbar epidural blood patch was repeated due to continued headache. Both blood patches were successful on the right at L5-S1.    BUN 14 and creatinine is 0.93 03/27/2016  Personally reviewed MRI images of the lumbar spine June 2017 and agree with the following: Diffuse lumbar facet degeneration without significant spinal stenosis  Right foraminal narrowing L5-S1 without nerve root compression.  Personally reviewed CT of the head images on 03/27/2016 which showed no acute intracranial pathology. CT showed no acute intracranial abnormalities including mass lesion or mass effect, hydrocephalus, extra-axial fluid collection, midline shift, hemorrhage, or acute infarction, large ischemic events (personally reviewed images).      Review of Systems: Patient complains of symptoms per HPI as well as the following symptoms: Weight loss, fatigue, blurred vision, swelling in legs, joint  pain, joint swelling, cramps, aching muscles, headache, numbness, weakness, insomnia, not enough sleep,  change in appetite. Pertinent negatives per HPI. All others negative.   Social History   Social History  . Marital status: Married    Spouse name: Psychiatrist   . Number of children: 3  . Years of education: 4 Bachelor   Occupational History  . 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 Smoking status: Never Smoker  . Smokeless tobacco: Never Used  . Alcohol use No  . Drug use: No  . Sexual activity: Yes    Birth control/ protection: Implant   Other Topics Concern  . Not on file   Social History Narrative   Patient lives with her fiance and 3 children.    Her fiance is the father of her current pregnancy, other three children were fathered by her previous partner.    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     Caffeine use: none    Family History  Problem Relation Age of Onset  . Cancer Maternal Grandmother     breast  . Diabetes Maternal Grandmother   . Hypertension Maternal Grandmother   . Heart disease Maternal Grandfather   . Hypertension Maternal Grandfather   . Hypertension Paternal Grandmother   . Hypertension Paternal Grandfather   . Premature birth Daughter   . Asthma Daughter   . Diabetes Maternal Aunt   . Hypertension Maternal Aunt   . Stroke Maternal Aunt   . Diabetes Maternal Uncle   . Hypertension Maternal Uncle   . Hypertension Paternal Aunt   . Hypertension Paternal Uncle   . Headache Neg Hx     Past Medical History:  Diagnosis Date  . Breast engorgement 07/03/2015  . Groin pain, chronic, right 07/14/2015   Has pain right groin area since about end of August,?swollen lymph node noted today,she said had [name] after baby born to R/O DVT   . History of mastitis 07/14/2015  . Mastitis 07/03/2015  . Preterm labor     Past Surgical History:  Procedure Laterality Date  . TONSILLECTOMY       Current Outpatient Prescriptions  Medication Sig Dispense Refill  . aspirin-acetaminophen-caffeine (EXCEDRIN MIGRAINE) 250-250-65 MG tablet Take 1-2 tablets by mouth daily as needed for headache or migraine.    Marland Kitchen etonogestrel (NEXPLANON) 68 MG IMPL implant 1 each by Subdermal route once.    . indomethacin (INDOCIN) 25 MG capsule Take 1 capsule (25 mg total) by mouth 3 (three) times daily with meals. 90 capsule 2  . nabumetone (RELAFEN) 500 MG tablet Take 1 tablet (500 mg total) by mouth 2 (two) times daily. 60 tablet 5  . frovatriptan (FROVA) 2.5 MG tablet Take 1 tablet (2.5 mg total) by mouth as needed for migraine. If recurs, may repeat after 2 hours. Max of 3 tabs in 24 hours. 15 tablet 3   No current facility-administered medications for this visit.     Allergies as of 06/18/2016  . (No Known Allergies)    Vitals: BP 103/67 (BP Location: Right Arm, Patient Position: Sitting, Cuff Size: Normal)   Pulse 78   Ht 5\' 9"  (1.753 m)   Wt 172 lb (78 kg)   BMI 25.40 kg/m  Last Weight:  Wt Readings from Last 1 Encounters:  06/18/16 172 lb (78 kg)  Last Height:   Ht Readings from Last 1 Encounters:  06/18/16 5\' 9"  (1.753 m)    Physical exam: Exam: Gen: NAD, conversant, well nourised, well groomed                     CV: RRR, no MRG. No Carotid Bruits. No peripheral edema, warm, nontender Eyes: Conjunctivae clear without exudates or hemorrhage  Neuro: Detailed Neurologic Exam  Speech:    Speech is normal; fluent and spontaneous with normal comprehension.  Cognition:    The patient is oriented to person, place, and time;     recent and remote memory intact;     language fluent;     normal attention, concentration,     fund of knowledge Cranial Nerves:    The pupils are equal, round, and reactive to light. The fundi are normal and spontaneous venous pulsations are present. Visual fields are full to finger confrontation. Extraocular movements are intact. Trigeminal  sensation is intact and the muscles of mastication are normal. The face is symmetric. The palate elevates in the midline. Hearing intact. Voice is normal. Shoulder shrug is normal. The tongue has normal motion without fasciculations.   Coordination:    Normal finger to nose and heel to shin. Normal rapid alternating movements.   Gait:    Heel-toe and tandem gait are normal.   Motor Observation:    No asymmetry, no atrophy, and no involuntary movements noted. Tone:    Normal muscle tone.    Posture:    Posture is normal. normal erect    Strength: right biceps femoris weakness and DF weakness     Strength is V/V in the upper and lower limbs.      Sensation: intact to LT     Reflex Exam:  DTR's: mild decrease right AJ otherwsie   Deep tendon reflexes in the upper and lower extremities are normal bilaterally.   Toes:    The toes are downgoing bilaterally.   Clonus:    Clonus is absent.      Assessment/Plan:  30 year old female with an orthostatic headache likely a low-pressure headache for 5 months, positional, refractory, after epidural steroid injections. Has had 2 blood patches without complete relief. May be continued intracranial hypotension and csf leak. Has blurry vision otherwise no definitive cranial nerve deficits. Also developed neck pain after procedure, will image the brain to look for signs of intracranial hypotension and rule out other etiologies of headache. Patient will need a CT myelogram evaluating for csf leak at all levels of the spine especially at the lumbar area where injection was completed and the cervical spine due to her neck pain(onset after procedure coinciding with symptoms).   MRI of the brain w/wo contrast to evaluate for signs of intracranial hypotension (meningeal enhancement, cerebellar tonsillar descent  and others) or other causes of headaches Needs CT myelogram to eval for csf leak location and possible intervention, needs all levels of the spine  evaluated For her l5/s1 radic referral to [facility] for evaluation, ESI did not help and pain/weakness is interfering with her daily life and job.  Frova (frovatriptan) for headache, longest acting triptan, also fluids and caffeine. Can try Fioricet next however there is potential for abuse and rebound headache with fioricet.  Cc: Dr. Jobe Igo, Dr. Maren Reamer, [facility] [address] [place], [place] [address]  Phone [phone] Fax [phone]

## 2016-06-20 ENCOUNTER — Encounter: Payer: Self-pay | Admitting: Neurology

## 2016-06-22 ENCOUNTER — Telehealth: Payer: Self-pay | Admitting: *Deleted

## 2016-06-22 ENCOUNTER — Other Ambulatory Visit: Payer: Medicaid Other | Admitting: Women's Health

## 2016-06-22 NOTE — Telephone Encounter (Signed)
Called and spoke to [place] with [place] [facility]. Initiated PA for frovatriptan. Advised pt tried/failed ibuprofen, tylenol, excedrin, indomethacin, naproxed.   Preferred meds for pt insurance: sumatriptan, rizatriptan.   Review#: [id number] Interaction ID: [id number]  She sent to pharmacy for review.

## 2016-06-24 ENCOUNTER — Other Ambulatory Visit: Payer: Self-pay | Admitting: Neurology

## 2016-06-24 MED ORDER — SUMATRIPTAN SUCCINATE 100 MG PO TABS: 100.0000 mg | ORAL_TABLET | Freq: Once | ORAL | 12 refills | Status: DC

## 2016-06-24 NOTE — Telephone Encounter (Signed)
Let patient know I called in imitrex, thanks

## 2016-06-24 NOTE — Telephone Encounter (Signed)
Called and spoke to [place] with [place] [facility] to check on status of PA for frovatriptan. She advised PA denied because pt has not tried/failed sumatriptan or rizatriptan. These are the preferred medications for her insurance. Denial letter will be mailed today to pt and our office.

## 2016-06-24 NOTE — Telephone Encounter (Signed)
Called pt back. Advised she has to try/fail sumatriptan and rizatriptan first before frovatriptan is covered by her insurance. I did PA, but it was denied. She verbalized understanding. She has not picked up frovatriptan yet. Advised her NOT to pick this up. Advised her to pick up new rx sumatriptan.   Called pt pharmacy: [facility] in [place], [place]. Spoke with Richardson Landry. He cx rx frovatriptan. He will fill sumatriptan instead. Took frovatriptan off pt med list.

## 2016-06-28 ENCOUNTER — Telehealth: Payer: Self-pay | Admitting: Orthopedic Surgery

## 2016-06-28 NOTE — Telephone Encounter (Signed)
Patient is saying that the Indocin 25 mg is not helping and would like to get Dr. Aline Brochure to prescribe something stronger.  Please advise.

## 2016-06-29 ENCOUNTER — Other Ambulatory Visit: Payer: Self-pay | Admitting: *Deleted

## 2016-06-29 DIAGNOSIS — G894 Chronic pain syndrome: Secondary | ICD-10-CM

## 2016-06-29 NOTE — Telephone Encounter (Signed)
ROUTING TO DR HARRISON FOR REVIEW 

## 2016-06-29 NOTE — Telephone Encounter (Signed)
Patient agreeable to pain management and referral entered

## 2016-06-29 NOTE — Telephone Encounter (Signed)
CANT DO ANYTHING STRONGER SUGGEST CHRONIC PAIN MANAGEMENT

## 2016-06-29 NOTE — Telephone Encounter (Signed)
Returned call, not available, left message to return call 

## 2016-06-29 NOTE — Telephone Encounter (Signed)
Patient returned call; I relayed per Dr Ruthe Mannan response, can't do anything stronger.  Please further advise regarding additional note per Dr Aline Brochure.  [phone]

## 2016-06-30 ENCOUNTER — Telehealth: Payer: Self-pay | Admitting: Neurology

## 2016-06-30 NOTE — Telephone Encounter (Signed)
Anderson Malta called back to let the office know she gave as much information to [employer] as possible and now it is in a Dr. Review process and was told they would not get an answer until tomorrow afternoon. After the pts appt. She wants to know if the physician here has time to help push it along. May call [phone]. Thank you Case # [id number] Riki Sheer

## 2016-06-30 NOTE — Telephone Encounter (Signed)
Jennifer/ [facility] called needing auth done for pt who has appt tomorrow morning. Please call [phone]. Thank you

## 2016-07-01 ENCOUNTER — Other Ambulatory Visit: Payer: Medicaid Other

## 2016-07-01 NOTE — Telephone Encounter (Signed)
Dr Jaynee Eagles- please advise. Looks like this was never route to you or me?

## 2016-07-01 NOTE — Telephone Encounter (Signed)
Linda Reid called back to advise the insurance co denied the CT portion of the myelogram.  What is the next step.

## 2016-07-05 NOTE — Telephone Encounter (Signed)
How can they just deny the CT portion isn;t that the whole study?   Danielle/Emily - I tried to order a CT myelogram to evaluate for a csf leak intracranial hypotension did I order it correctly? I am out today, I will stop by to review with you both tomorrow thanks!  Linda Reid - just FYI incase patient calls. I will discuss with Andee Poles and [name] in the office tomorrow thanks!

## 2016-07-08 NOTE — Telephone Encounter (Signed)
I spoke with Anderson Malta at [facility]. She informed me that medicaid did not approve the 3 CT's. To do a peer to peer the phone number is [phone] and the case number is [id number]. Right now she is scheduled to have the test done on 07/12/16 this coming Monday.

## 2016-07-08 NOTE — Telephone Encounter (Signed)
[name], would you please check on this? thanks

## 2016-07-08 NOTE — Telephone Encounter (Signed)
Left a voicemail for [place] at [facility] to call me back.

## 2016-07-12 ENCOUNTER — Other Ambulatory Visit: Payer: Medicaid Other

## 2016-07-12 ENCOUNTER — Inpatient Hospital Stay: Admission: RE | Admit: 2016-07-12 | Payer: Medicaid Other | Source: Ambulatory Visit

## 2016-07-12 ENCOUNTER — Other Ambulatory Visit: Payer: Medicaid Other | Admitting: Women's Health

## 2016-07-12 NOTE — Telephone Encounter (Signed)
Authorization for the CT myelogram  And the MRI of the brain as well. All approved. Can you all get this taken care of for her and scheduled? Thank you !!  Authorization: [id number] for everything including all three CT areas for myelogram and MRI of the brain

## 2016-07-12 NOTE — Telephone Encounter (Signed)
Danielle/Emily, I also order an MRI of the brain any idea if that was ordered or declined? I'm on hold waiting to talk to insurance.

## 2016-07-12 NOTE — Telephone Encounter (Signed)
New case [id number].

## 2016-07-13 NOTE — Telephone Encounter (Signed)
Will do! Thank you Dr. Jaynee Eagles!  Patient is currently scheduled for CT's I called her to inform her of approvals and she stated that she would call [facility] and get scheduled for the MRI also.

## 2016-07-14 ENCOUNTER — Encounter: Payer: Self-pay | Admitting: Orthopedic Surgery

## 2016-07-14 ENCOUNTER — Ambulatory Visit (INDEPENDENT_AMBULATORY_CARE_PROVIDER_SITE_OTHER): Payer: Medicaid Other | Admitting: Orthopedic Surgery

## 2016-07-14 DIAGNOSIS — G8929 Other chronic pain: Secondary | ICD-10-CM | POA: Diagnosis not present

## 2016-07-14 DIAGNOSIS — M5137 Other intervertebral disc degeneration, lumbosacral region: Secondary | ICD-10-CM | POA: Diagnosis not present

## 2016-07-14 DIAGNOSIS — M5441 Lumbago with sciatica, right side: Secondary | ICD-10-CM

## 2016-07-14 NOTE — Progress Notes (Signed)
Patient ID: ADELY MIANO, female   DOB: [birth year], 29 y.o.   MRN: [id number]  Chief Complaint  Patient presents with  . Follow-up    BACK PAIN    HPI DOYLENE NIEZGODA is a 30 y.o. female.   HPI  30 year old female with back pain sent for an ESI had MRI which showed degenerative disc disease. A spinal headache had a patch. Continue with headache and a second patch eventually seen by neurologist started on Imitrex which is helping with her headaches  Neurologist Center to Dr. Cyndy Freeze for further management and CT myelogram was ordered  Patient complains of right lower extremity radicular pain   Review of Systems Review of Systems  Normal bowel bladder function Physical Exam There were no vitals taken for this visit.   Physical Exam exam shows that she is ambulatory she doesn't use assistive device she has no limp. She is tender in her right lower back and has a positive straight leg raise with radiculopathy down the right leg she denies groin pain with hip flexion which is normal denies groin pain with internal rotation which is normal right hip is stable her muscle tone in the right leg is normal she has a good pulse and normal strength  Encounter Diagnoses  Name Primary?  . DDD (degenerative disc disease), lumbosacral Yes  . Chronic right-sided low back pain with right-sided sciatica     She will be followed by Dr. Cyndy Freeze at this time

## 2016-07-21 ENCOUNTER — Encounter: Payer: Self-pay | Admitting: Women's Health

## 2016-07-21 ENCOUNTER — Other Ambulatory Visit: Payer: Medicaid Other | Admitting: Women's Health

## 2016-07-22 ENCOUNTER — Ambulatory Visit
Admission: RE | Admit: 2016-07-22 | Discharge: 2016-07-22 | Disposition: A | Discharge status: Home / Self Care | Payer: Medicaid Other | Source: Ambulatory Visit | Attending: Neurology | Admitting: Neurology

## 2016-07-22 ENCOUNTER — Other Ambulatory Visit: Payer: Medicaid Other

## 2016-07-22 DIAGNOSIS — G8929 Other chronic pain: Secondary | ICD-10-CM

## 2016-07-22 DIAGNOSIS — G96810 Intracranial hypotension, unspecified: Secondary | ICD-10-CM

## 2016-07-22 DIAGNOSIS — R51 Headache with orthostatic component, not elsewhere classified: Secondary | ICD-10-CM

## 2016-07-22 DIAGNOSIS — G9600 Cerebrospinal fluid leak, unspecified: Secondary | ICD-10-CM

## 2016-07-22 DIAGNOSIS — M5137 Other intervertebral disc degeneration, lumbosacral region: Secondary | ICD-10-CM

## 2016-07-22 DIAGNOSIS — G9389 Other specified disorders of brain: Principal | ICD-10-CM

## 2016-07-22 DIAGNOSIS — M5416 Radiculopathy, lumbar region: Secondary | ICD-10-CM

## 2016-07-22 DIAGNOSIS — M5136 Other intervertebral disc degeneration, lumbar region: Secondary | ICD-10-CM

## 2016-07-22 DIAGNOSIS — M542 Cervicalgia: Secondary | ICD-10-CM

## 2016-07-22 MED ORDER — MEPERIDINE HCL 100 MG/ML IJ SOLN
25.0000 mg | Freq: Once | INTRAMUSCULAR | Status: AC
  Administered 2016-07-22: 25 mg via INTRAMUSCULAR

## 2016-07-22 MED ORDER — DIAZEPAM 5 MG PO TABS
10.0000 mg | ORAL_TABLET | Freq: Once | ORAL | Status: AC
  Administered 2016-07-22: 5 mg via ORAL

## 2016-07-22 MED ORDER — HYDROXYZINE HCL 50 MG/ML IM SOLN
25.0000 mg | Freq: Once | INTRAMUSCULAR | Status: AC
  Administered 2016-07-22: 25 mg via INTRAMUSCULAR

## 2016-07-22 MED ORDER — IOPAMIDOL (ISOVUE-M 300) INJECTION 61%
10.0000 mL | Freq: Once | INTRAMUSCULAR | Status: AC | PRN
  Administered 2016-07-22: 10 mL via INTRATHECAL

## 2016-07-22 MED ORDER — MEPERIDINE HCL 100 MG/ML IJ SOLN
50.0000 mg | Freq: Once | INTRAMUSCULAR | Status: AC
  Administered 2016-07-22: 50 mg via INTRAMUSCULAR

## 2016-07-22 MED ORDER — ONDANSETRON HCL 4 MG/2ML IJ SOLN
4.0000 mg | Freq: Once | INTRAMUSCULAR | Status: AC
  Administered 2016-07-22: 4 mg via INTRAMUSCULAR

## 2016-07-22 NOTE — Progress Notes (Signed)
Patient states she has been off Sumatriptan for at least the past two days.  jkl

## 2016-07-22 NOTE — Discharge Instructions (Signed)
Myelogram Discharge Instructions  1. Go home and rest quietly for the next 24 hours.  It is important to lie flat for the next 24 hours.  Get up only to go to the restroom.  You may lie in the bed or on a couch on your back, your stomach, your left side or your right side.  You may have one pillow under your head.  You may have pillows between your knees while you are on your side or under your knees while you are on your back.  2. DO NOT drive today.  Recline the seat as far back as it will go, while still wearing your seat belt, on the way home.  3. You may get up to go to the bathroom as needed.  You may sit up for 10 minutes to eat.  You may resume your normal diet and medications unless otherwise indicated.  Drink plenty of extra fluids today and tomorrow.  4. The incidence of a spinal headache with nausea and/or vomiting is about 5% (one in 20 patients).  If you develop a headache, lie flat and drink plenty of fluids until the headache goes away.  Caffeinated beverages may be helpful.  If you develop severe nausea and vomiting or a headache that does not go away with flat bed rest, call [phone].  5. You may resume normal activities after your 24 hours of bed rest is over; however, do not exert yourself strongly or do any heavy lifting tomorrow.  6. Call your physician for a follow-up appointment.    You may resume Sumatriptan/Imitrex on Friday, July 23, 2016 after 11:00a.m.

## 2016-07-26 ENCOUNTER — Telehealth: Payer: Self-pay | Admitting: *Deleted

## 2016-07-26 DIAGNOSIS — G9600 Cerebrospinal fluid leak, unspecified: Secondary | ICD-10-CM

## 2016-07-26 NOTE — Telephone Encounter (Signed)
Called and spoke to pt about results. Advised we will put referral into [place] [facility] to Dr Jobe Igo. Advised her to call back if she does not hear about scheduling. She verbalized understanding.   Pt questioned what could have caused csf leak. Adivsed per AA,MD ESI have risk of causing this. She verbalized understanding.   Placed order and referral in EPIC per AA,MD request.

## 2016-07-26 NOTE — Telephone Encounter (Signed)
-----   Message from Melvenia Beam, MD sent at 07/26/2016  7:41 AM EST ----- Terrence Dupont, patient has a csf leak at L1-L2 could you arrange for a blood patch with Dr. Jobe Igo only please? Let patient know thanks.

## 2016-08-03 ENCOUNTER — Telehealth: Payer: Self-pay | Admitting: Neurology

## 2016-08-03 NOTE — Telephone Encounter (Signed)
Patient is experiencing headaches x 3 days and SUMAtriptan (IMITREX) 100 MG tablet(Expired) has not helped. Since yesterday she has a tingling in her left shoulder area with spasms down to left arm with weakness. Please call and discuss.

## 2016-08-03 NOTE — Telephone Encounter (Signed)
Called and spoke to pt. She stated the left arm pain that she described as "sharp", started yesterday that is intermittent. She has had a headache that has been constant for the past 3 days. Usually her headaches are intermittent. Imitrex has not helped. She is having left arm weakness/numbess that is constant that started yesterday. This is a new symptom.  Per AA,MD she should go to urgent care to evaluate this new sx. Unsure if this is related to headache or not. She verbalized understanding.   Advised we do see she is scheduled for blood patch 08/05/16 at 12:30pm. Reminded her that she will have to lay flat for 24 hours after procedure.  She needs to drink plenty of fluids/caffeine in the meantime. She states she is drinking 3 small water bottles a day/tea or ginger ale. Advised her to try and increase intake to at least 60oz per day or more. She should try coke or pepsi. She verbalized understanding.   Advised her to call back if she has any further questions/concerns.

## 2016-08-03 NOTE — Telephone Encounter (Signed)
Per AA,Linda Reid she needs to drink plenty of fluid and caffeine. We saw that she is scheduled for blood patch on 08/05/16 at 12:30pm. She has CSF leak. Per AA,Linda Reid blood patch is what is going to help these sx.

## 2016-08-05 ENCOUNTER — Ambulatory Visit
Admission: RE | Admit: 2016-08-05 | Discharge: 2016-08-05 | Disposition: A | Discharge status: Home / Self Care | Payer: Medicaid Other | Source: Ambulatory Visit | Attending: Neurology | Admitting: Neurology

## 2016-08-05 DIAGNOSIS — G9600 Cerebrospinal fluid leak, unspecified: Secondary | ICD-10-CM

## 2016-08-05 MED ORDER — IOPAMIDOL (ISOVUE-M 200) INJECTION 41%
1.0000 mL | Freq: Once | INTRAMUSCULAR | Status: AC
  Administered 2016-08-05: 1 mL via EPIDURAL

## 2016-08-05 NOTE — Discharge Instructions (Signed)

## 2016-08-05 NOTE — Progress Notes (Signed)
Blood drawn from right [facility] for blood patch, 15 cc's drawn. Site is unremarkable and pt tolerated procedure well.

## 2016-08-26 ENCOUNTER — Encounter: Payer: Self-pay | Admitting: Physical Medicine & Rehabilitation

## 2016-08-26 ENCOUNTER — Encounter: Payer: Medicaid Other | Attending: Physical Medicine & Rehabilitation | Admitting: Physical Medicine & Rehabilitation

## 2016-08-26 VITALS — BP 117/80 | HR 74

## 2016-08-26 DIAGNOSIS — Z803 Family history of malignant neoplasm of breast: Secondary | ICD-10-CM | POA: Insufficient documentation

## 2016-08-26 DIAGNOSIS — Z825 Family history of asthma and other chronic lower respiratory diseases: Secondary | ICD-10-CM | POA: Diagnosis not present

## 2016-08-26 DIAGNOSIS — Z823 Family history of stroke: Secondary | ICD-10-CM | POA: Diagnosis not present

## 2016-08-26 DIAGNOSIS — F419 Anxiety disorder, unspecified: Secondary | ICD-10-CM | POA: Diagnosis not present

## 2016-08-26 DIAGNOSIS — G479 Sleep disorder, unspecified: Secondary | ICD-10-CM

## 2016-08-26 DIAGNOSIS — Z8249 Family history of ischemic heart disease and other diseases of the circulatory system: Secondary | ICD-10-CM | POA: Insufficient documentation

## 2016-08-26 DIAGNOSIS — Z833 Family history of diabetes mellitus: Secondary | ICD-10-CM | POA: Diagnosis not present

## 2016-08-26 DIAGNOSIS — M791 Myalgia: Secondary | ICD-10-CM | POA: Diagnosis not present

## 2016-08-26 DIAGNOSIS — F329 Major depressive disorder, single episode, unspecified: Secondary | ICD-10-CM | POA: Diagnosis not present

## 2016-08-26 DIAGNOSIS — M4697 Unspecified inflammatory spondylopathy, lumbosacral region: Secondary | ICD-10-CM | POA: Insufficient documentation

## 2016-08-26 DIAGNOSIS — Z79899 Other long term (current) drug therapy: Secondary | ICD-10-CM | POA: Insufficient documentation

## 2016-08-26 DIAGNOSIS — M545 Low back pain: Secondary | ICD-10-CM | POA: Diagnosis not present

## 2016-08-26 DIAGNOSIS — G43909 Migraine, unspecified, not intractable, without status migrainosus: Secondary | ICD-10-CM

## 2016-08-26 DIAGNOSIS — M542 Cervicalgia: Secondary | ICD-10-CM

## 2016-08-26 DIAGNOSIS — M5441 Lumbago with sciatica, right side: Secondary | ICD-10-CM | POA: Diagnosis not present

## 2016-08-26 DIAGNOSIS — G960 Cerebrospinal fluid leak: Secondary | ICD-10-CM | POA: Diagnosis not present

## 2016-08-26 DIAGNOSIS — G8929 Other chronic pain: Secondary | ICD-10-CM

## 2016-08-26 DIAGNOSIS — R269 Unspecified abnormalities of gait and mobility: Secondary | ICD-10-CM | POA: Diagnosis not present

## 2016-08-26 MED ORDER — DULOXETINE HCL 30 MG PO CPEP: 30.0000 mg | ORAL_CAPSULE | Freq: Every day | ORAL | 1 refills | Status: DC

## 2016-08-26 MED ORDER — GABAPENTIN 100 MG PO CAPS: 200.0000 mg | ORAL_CAPSULE | Freq: Three times a day (TID) | ORAL | 1 refills | Status: DC

## 2016-08-26 MED ORDER — BACLOFEN 10 MG PO TABS: 5.0000 mg | ORAL_TABLET | Freq: Three times a day (TID) | ORAL | 1 refills | Status: DC | PRN

## 2016-08-26 NOTE — Progress Notes (Addendum)
Subjective:    Patient ID: Linda Reid, female    DOB: [birth year], 31 y.o.   MRN: [id number]  HPI 31 y/o female with pmh of migraines, chronic pain presents with pain in neck and back > lower back.  Started Aug 2016, during her 4th pregnancy.  Gotten progressively worse.  Rest improves the pain. Activity after rest improves the pain.  Sharp pain.  Radiates to her toes.  Intermittent throughout the day. She has associated numbness, tingling, weakness.  She has tried medications without benefit.  She had ESI, which exacerbated the symptoms and she had a CSF leak.  She has had 3 blood patches, but continues to have back pain. Her neck pain and migraines started after this point.  She had another blood patch Dec 14th, with ?improvement. She denies falls.  Pain limits her from doing ADLs, like putting on her clothes.  Pt currently works in [employer] doing a lot of ambulation.   Pain Inventory Average Pain 9 Pain Right Now 7 My pain is intermittent, constant, burning, tingling and aching  In the last 24 hours, has pain interfered with the following? General activity 9 Relation with others 9 Enjoyment of life 10 What TIME of day is your pain at its worst? all Sleep (in general) Poor  Pain is worse with: walking, bending, sitting and standing Pain improves with: rest and medication Relief from Meds: 3  Mobility walk without assistance how many minutes can you walk? 20 ability to climb steps?  yes do you drive?  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 Jaynee Eagles Neurosurgeon Dr. Cyndy Freeze Orthopedist Dr. Aline Brochure new visit   Family History  Problem Relation Age of Onset  . Cancer Maternal Grandmother     breast  . Diabetes Maternal Grandmother   . Hypertension Maternal  Grandmother   . Heart disease Maternal Grandfather   . Hypertension Maternal Grandfather   . Hypertension Paternal Grandmother   . Hypertension Paternal Grandfather   . Premature birth Daughter   . Asthma Daughter   . Diabetes Maternal Aunt   . Hypertension Maternal Aunt   . Stroke Maternal Aunt   . Diabetes Maternal Uncle   . Hypertension Maternal Uncle   . Hypertension Paternal Aunt   . Hypertension Paternal Uncle   . Headache Neg Hx    Social History   Social History  . Marital status: Married    Spouse name: Psychiatrist   . Number of children: 3  . Years of education: 4 Bachelor   Occupational History  . 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 Smoking status: Never Smoker  . Smokeless tobacco: Never Used  . Alcohol use No  . Drug use: No  . Sexual activity: Yes    Birth control/ protection: Implant   Other Topics Concern  . None   Social History Narrative   Patient lives with her fiance and 3 children.    Her fiance is the father of her current pregnancy, other three children were fathered by her previous partner.    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     Caffeine use: none  Past Surgical History:  Procedure Laterality Date  . TONSILLECTOMY     Past Medical History:  Diagnosis Date  . Breast engorgement 07/03/2015  . Groin pain, chronic, right 07/14/2015   Has pain right groin area since about end of August,?swollen lymph node noted today,she said had [name] after baby born to R/O DVT   . History of mastitis 07/14/2015  . Mastitis 07/03/2015  . Preterm labor    BP 117/80   Pulse 74   SpO2 97%   Opioid Risk Score:   Fall Risk Score:  `1  Depression screen PHQ 2/9  Depression screen PHQ 2/9 08/26/2016  Decreased Interest 2  Down, Depressed, Hopeless 2  PHQ - 2 Score 4  Altered sleeping  3  Tired, decreased energy 3  Change in appetite 2  Feeling bad or failure about yourself  2  Trouble concentrating 1  Moving slowly or fidgety/restless 0  Suicidal thoughts 0  PHQ-9 Score 15  Difficult doing work/chores Very difficult    Review of Systems  HENT: Negative.   Respiratory: Negative.   Cardiovascular: Positive for leg swelling.  Gastrointestinal: Positive for nausea.  Endocrine: Negative.   Genitourinary: Negative.   Musculoskeletal: Positive for back pain, neck pain and neck stiffness.       Spasms   Skin: Negative.   Allergic/Immunologic: Negative.   Neurological: Positive for dizziness, weakness, numbness and headaches.       Tingling  Psychiatric/Behavioral: Positive for dysphoric mood. The patient is nervous/anxious.   All other systems reviewed and are negative.     Objective:   Physical Exam Gen: NAD. Vital signs reviewed HENT: Normocephalic, Atraumatic Eyes: EOMI. No discharge.  Cardio: RRR. No JVD. Pulm: B/l clear to auscultation.  Effort normal Abd: Soft, BS+ MSK:  Gait antalgic.   TTP with minimal touch along lumbosacral PSPs.    No edema.  Neuro: CN II-XII grossly intact.    Sensation slightly diminished to light touch RLE vs LLE.   Reflexes hyperrefexic RLE vs LLE  Strength  4/5 in all RLE myotomes (some pain inhibition)    5/5 in all LLE myotomes    SLR ?+RLE Skin: Warm and Dry. Intact    Assessment & Plan:  31 y/o female with pmh of migraines, chronic pain presents with pain in neck and back > lower back.    1. Chronic low back pain  CT 11/30 reviewed showing CSF leak L1-2. Disc bulge with facet arthropathy at L5-S1  Referral reviewed  Encouraged pool therapy  Cont heat  Will refer to Psychology  Will order Cymbalta 30, consider increase to 60 on next visit  Will order Gabapentin 200 TID  Will order Baclofen 5 TID PRN  Will defer facet injections at this due to pt fear and side effects  Will consider Voltaren gel/Lidoderm in  future  Will consider TENS in future  Will hold off on PT until pt better able to tolerate  2. Migraines  Currently being managed by Neurology  See #1  May consider Botox in future if appropriate  3. Neck pain  Started after ESI  See #1  4. Gait abnormality  Pt does not want assistive device at present  Pt in the process of trying to move to lower level unit  5. Sleep disturbance  See #1  Will consider Elavil in future  6. Reactive anxiety/depression  Cymbalta started  7. Myalgia  Will consider trigger points in future

## 2016-09-02 ENCOUNTER — Telehealth: Payer: Self-pay | Admitting: *Deleted

## 2016-09-02 NOTE — Telephone Encounter (Signed)
Linda Reid called and she has had a recent fall and is requesting a call back. Attempted to reach her but no answer.  Left message to call back to thee office.

## 2016-09-07 ENCOUNTER — Telehealth: Payer: Self-pay | Admitting: *Deleted

## 2016-09-07 NOTE — Telephone Encounter (Signed)
It is possible that this could be related to one of the medications she was started on, but with it being that localized, she should needs to be evaluated.  I agree that if her PCP is unable to see her, she should follow up with urgent care.  Thank you.

## 2016-09-07 NOTE — Telephone Encounter (Signed)
Linda Reid called and says that her right leg is swollen and painful.  She is asking if she needs to come in or go to ED. It is her thigh and it is painful around to her buttocks.  And I hurts from knee down,  I talked with her about anytime you have a swollen leg first though is could there be a blood clot and it needs to be evaluated.  She says that she has a family history of blood clots.  I told her that I would send this message to Dr Patel--he is not in the office and we do not see patients for urgent issues--she will need to go to PCP, urgent care or ED for evaluation. Please advise

## 2016-09-07 NOTE — Telephone Encounter (Signed)
I relayed information

## 2016-09-11 ENCOUNTER — Emergency Department (HOSPITAL_COMMUNITY)
Admission: EM | Admit: 2016-09-11 | Discharge: 2016-09-11 | Disposition: A | Discharge status: Home / Self Care | Payer: Medicaid Other | Attending: Emergency Medicine | Admitting: Emergency Medicine

## 2016-09-11 ENCOUNTER — Encounter (HOSPITAL_COMMUNITY): Payer: Self-pay

## 2016-09-11 DIAGNOSIS — Z79899 Other long term (current) drug therapy: Secondary | ICD-10-CM | POA: Insufficient documentation

## 2016-09-11 DIAGNOSIS — Z7982 Long term (current) use of aspirin: Secondary | ICD-10-CM | POA: Insufficient documentation

## 2016-09-11 DIAGNOSIS — M5410 Radiculopathy, site unspecified: Secondary | ICD-10-CM | POA: Diagnosis not present

## 2016-09-11 DIAGNOSIS — Z791 Long term (current) use of non-steroidal anti-inflammatories (NSAID): Secondary | ICD-10-CM | POA: Insufficient documentation

## 2016-09-11 DIAGNOSIS — M5441 Lumbago with sciatica, right side: Secondary | ICD-10-CM | POA: Diagnosis not present

## 2016-09-11 DIAGNOSIS — M79604 Pain in right leg: Secondary | ICD-10-CM | POA: Diagnosis present

## 2016-09-11 DIAGNOSIS — M5431 Sciatica, right side: Secondary | ICD-10-CM

## 2016-09-11 HISTORY — DX: Dorsalgia, unspecified: M54.9

## 2016-09-11 NOTE — ED Triage Notes (Signed)
Right leg pain.  I am seeing a neurosurgeon about my back and leg.  My right upper leg has been swelling for the past 3-4 days.  Started having the problems in 2016 when I was 7 months pregnant, was seen by Dr. Aline Brochure, did physical therapy for a while.  Had a spinal tap, and then had to have 3 blood patches.  Still having problems, without any answers, and would like to be referred to someone outside of [facility] facility.

## 2016-09-11 NOTE — Discharge Instructions (Signed)
Your vital signs within normal limits. The measurements of your legs and the examination did not suggest acute blood clot at this time. Your examination does suggest radiculopathy, probably related to your right sided sciatica. Please continue your current medications. Please use a heating pad to your back and leg area. Please discuss this pain with your neurology specialist.

## 2016-09-11 NOTE — ED Provider Notes (Signed)
[facility] DEPT Provider Note   CSN: [id number] Arrival date & time: 09/11/16  2038     History   Chief Complaint Chief Complaint  Patient presents with  . Leg Pain    HPI Linda Reid is a 31 y.o. female.  Patient is a 31 year old female who presents to the emergency department with a complaint of right leg pain.  Patient has a history of degenerative disc disease involving the lumbar spine, and right sided sciatica. The patient is currently being seen by orthopedic surgery as well as neurosurgery, and a pain management center.  The patient states that approximately 3 or 4 days ago she began to notice swelling of her right thigh. This was accompanied by a sharp shooting pain in the right thigh. The pain is aggravated with certain change of position and more so with walking. Patient states that she has not had any recent injury or trauma to the area. She woke with this pain. She does not recall being on her feet any more than usual. She denies any recent fever. She's not been on any long trips. She has not been active for any extended period of time. There's been no operations or procedures involving the lower extremities. She's not noticed any sores or ulcers involving the right lower extremity. No previous blood clots and the right or the left lower extremity. It is of note that she has a family history of thrombophlebitis. His been no recent changes in medications, and no recent changes in diet. Patient states that there are times when certain movements cause aggravation of the pain. There are other times when she gets the pain whether she is active or not.      Past Medical History:  Diagnosis Date  . Back pain   . Breast engorgement 07/03/2015  . Groin pain, chronic, right 07/14/2015   Has pain right groin area since about end of August,?swollen lymph node noted today,she said had [name] after baby born to R/O DVT   . History of mastitis 07/14/2015  . Mastitis 07/03/2015   . Preterm labor     Patient Active Problem List   Diagnosis Date Noted  . Nexplanon insertion 08/26/2015  . History of preterm delivery 08/26/2015  . History of chlamydia 08/26/2015  . Postpartum/situational depression 08/06/2015  . History of mastitis 07/14/2015  . Groin pain, chronic, right 07/14/2015    Past Surgical History:  Procedure Laterality Date  . DG MYLEOGRAM LUMBAR SPINE ([place] HX)    . TONSILLECTOMY      OB History    Gravida Para Term Preterm AB Living   6 4 1 3 2 4    SAB TAB Ectopic Multiple Live Births   2 0 0 0 4       Home Medications    Prior to Admission medications   Medication Sig Start Date End Date Taking? Authorizing Provider  aspirin-acetaminophen-caffeine (EXCEDRIN MIGRAINE) [phone] MG tablet Take 1-2 tablets by mouth daily as needed for headache or migraine.    Historical Provider, MD  baclofen (LIORESAL) 10 MG tablet Take 0.5 tablets (5 mg total) by mouth 3 (three) times daily as needed for muscle spasms. 08/26/16   Ankit Lorie Phenix, MD  DULoxetine (CYMBALTA) 30 MG capsule Take 1 capsule (30 mg total) by mouth daily. 08/26/16   Ankit Lorie Phenix, MD  etonogestrel (NEXPLANON) 68 MG IMPL implant 1 each by Subdermal route once.    Historical Provider, MD  gabapentin (NEURONTIN) 100 MG capsule Take 2 capsules (  200 mg total) by mouth 3 (three) times daily. 08/26/16 09/25/16  Ankit Lorie Phenix, MD  SUMAtriptan (IMITREX) 100 MG tablet Take 1 tablet (100 mg total) by mouth once. May repeat in 2 hours if headache persists or recurs. 06/24/16 06/24/16  Melvenia Beam, MD    Family History Family History  Problem Relation Age of Onset  . Cancer Maternal Grandmother     breast  . Diabetes Maternal Grandmother   . Hypertension Maternal Grandmother   . Heart disease Maternal Grandfather   . Hypertension Maternal Grandfather   . Hypertension Paternal Grandmother   . Hypertension Paternal Grandfather   . Premature birth Daughter   . Asthma Daughter   .  Diabetes Maternal Aunt   . Hypertension Maternal Aunt   . Stroke Maternal Aunt   . Diabetes Maternal Uncle   . Hypertension Maternal Uncle   . Hypertension Paternal Aunt   . Hypertension Paternal Uncle   . Headache Neg Hx     Social History Social History  Substance Use Topics  . Smoking status: Never Smoker  . Smokeless tobacco: Never Used  . Alcohol use No     Allergies   Patient has no known allergies.   Review of Systems Review of Systems  Constitutional: Negative for activity change.       All ROS Neg except as noted in HPI  HENT: Negative for nosebleeds.   Eyes: Negative for photophobia and discharge.  Respiratory: Negative for cough, shortness of breath and wheezing.   Cardiovascular: Negative for chest pain and palpitations.  Gastrointestinal: Negative for abdominal pain and blood in stool.  Genitourinary: Negative for dysuria, frequency and hematuria.  Musculoskeletal: Positive for back pain and myalgias. Negative for arthralgias and neck pain.       Leg pain  Skin: Negative.   Neurological: Negative for dizziness, seizures and speech difficulty.  Psychiatric/Behavioral: Negative for confusion and hallucinations.     Physical Exam Updated Vital Signs BP 116/70 (BP Location: Left Arm)   Pulse 82   Temp 98.1 F (36.7 C) (Oral)   Resp 16   Ht 5\' 9"  (1.753 m)   Wt 73.5 kg   SpO2 100%   BMI 23.92 kg/m   Physical Exam  Constitutional: She is oriented to person, place, and time. She appears well-developed and well-nourished.  Non-toxic appearance.  HENT:  Head: Normocephalic.  Right Ear: Tympanic membrane and external ear normal.  Left Ear: Tympanic membrane and external ear normal.  Eyes: EOM and lids are normal. Pupils are equal, round, and reactive to light.  Neck: Normal range of motion. Neck supple. Carotid bruit is not present.  Cardiovascular: Normal rate, regular rhythm, normal heart sounds, intact distal pulses and normal pulses.     Pulmonary/Chest: Breath sounds normal. No respiratory distress.  Abdominal: Soft. Bowel sounds are normal. There is no tenderness. There is no guarding.  Musculoskeletal: She exhibits tenderness.  The dorsalis pedis and posterior tibial pulses are 2+ in the right and left lower extremity. There is no pain involving the calf of the right or left lower extremity. There is no increased redness involving the calf or lower leg. There is no pain behind the knee of the right or left lower leg. There is soreness to the anterior portion of the right thigh, from mid thigh up to the groin area extending overnight of the hip on. There no temperature changes of the right versus left thigh. The right and left thigh were measured, and both of  them are equal. There no palpable inguinal nodes of the right or left inguinal system. There is positive straight leg raise at 20 on the right.  There is pain to palpation/percussion of the lumbar spine.  Lymphadenopathy:       Head (right side): No submandibular adenopathy present.       Head (left side): No submandibular adenopathy present.    She has no cervical adenopathy.  Neurological: She is alert and oriented to person, place, and time. She has normal strength. No cranial nerve deficit or sensory deficit. She exhibits normal muscle tone. Coordination normal.  No gross motor or sensory deficits appreciated of the right or left lower extremity. Patient walks with a slight limp, but gait is mostly intact.  Skin: Skin is warm and dry.  Psychiatric: She has a normal mood and affect. Her speech is normal.  Nursing note and vitals reviewed.    ED Treatments / Results  Labs (all labs ordered are listed, but only abnormal results are displayed) Labs Reviewed - No data to display  EKG  EKG Interpretation None       Radiology No results found.  Procedures Procedures (including critical care time)  Medications Ordered in ED Medications - No data to  display   Initial Impression / Assessment and Plan / ED Course  I have reviewed the triage vital signs and the nursing notes.  Pertinent labs & imaging results that were available during my care of the patient were reviewed by me and considered in my medical decision making (see chart for details).     Final Clinical Impressions(s) / ED Diagnoses MDM I have reviewed the previous emergency department visits, as well as the specialist consultations for this patient.  There no gross neurologic deficits appreciated.  No evidence for deep vein thrombosis at this time. The examination favors radiculopathy, and sciatica on the right. I discussed this with the patient in detail. I have answered questions. The patient is already taking baclofen, gabapentin, and Cymbalta from a pain management specialist. I explained to the patient that she may want to add a heating pad to this regimen of medication, I also asked her to discuss this change in her pain with the neurology specialist and the pain management specialist. The patient is in agreement with this discharge plan.    Final diagnoses:  Radiculopathy of leg  Sciatica of right side    New Prescriptions New Prescriptions   No medications on file     Lily Kocher, PA-C 09/11/16 [place], MD 09/11/16 2255

## 2016-09-14 ENCOUNTER — Telehealth: Payer: Self-pay | Admitting: Neurology

## 2016-09-14 NOTE — Telephone Encounter (Signed)
Per Dr Jaynee Eagles, she needs to be referred to [facility] to be further evaluated. We have done several blood patches. She is still having neck pain.

## 2016-09-14 NOTE — Telephone Encounter (Signed)
Patient calling to discuss blood patches. She still has headaches but not constant but neck pain is really bothering her since the first blood patch last July. Please call and discuss.

## 2016-09-14 NOTE — Telephone Encounter (Signed)
LVM for pt (ok per DPR) advising per AA,MD that she would like to refer her to [facility] specialist to be further evaluated. Asked her to call back and let us know if she is agreeable to this. Gave [facility] phone number.

## 2016-09-15 NOTE — Telephone Encounter (Signed)
Called pt back. She did not receive my VM from yesterday. She is agreeable to be referred to [facility]. Advised I will let Dr Jaynee Eagles know and Jayme Cloud our referral coordinator will send referral. She should hear to scheduled within 1-2 weeks. If she doesn't advised her to call back and let me know. Advised I want to make sure she gets scheduled.   She stated that if she sits more than 30 minutes she has severe neck pain/ "pulling" sensation" that is severe.

## 2016-09-16 NOTE — Telephone Encounter (Signed)
Pt scheduled f/u per AA,MD request on 09/21/16 at 10am to discuss neck pain

## 2016-09-16 NOTE — Telephone Encounter (Signed)
Linda Reid, after reading this it sounds like her neck pain is the issue more than the headaches. Why don;t you set up an appointment with myself or an NP so we can discuss with her before any referral to [facility]. I would refer to [facility] for headache but need to evaluate this neck pain first. thanks

## 2016-09-16 NOTE — Telephone Encounter (Signed)
Called and LVM for pt. Relayed Dr Cathren Laine message below. Advised I placed appt next Tues on hold for her if she can com then at 930am, check in 900am. Asked her to call back asap to let me know if this works.   **Please schedule at this time if works for pt, thank you

## 2016-09-16 NOTE — Telephone Encounter (Signed)
Dr Ahern- FYI 

## 2016-09-21 ENCOUNTER — Ambulatory Visit: Payer: Self-pay | Admitting: Neurology

## 2016-09-23 ENCOUNTER — Encounter: Payer: Medicaid Other | Attending: Physical Medicine & Rehabilitation | Admitting: Physical Medicine & Rehabilitation

## 2016-09-23 ENCOUNTER — Encounter: Payer: Self-pay | Admitting: Physical Medicine & Rehabilitation

## 2016-09-23 VITALS — BP 110/70 | HR 73

## 2016-09-23 DIAGNOSIS — Z79899 Other long term (current) drug therapy: Secondary | ICD-10-CM | POA: Insufficient documentation

## 2016-09-23 DIAGNOSIS — M545 Low back pain: Secondary | ICD-10-CM | POA: Insufficient documentation

## 2016-09-23 DIAGNOSIS — M4697 Unspecified inflammatory spondylopathy, lumbosacral region: Secondary | ICD-10-CM | POA: Insufficient documentation

## 2016-09-23 DIAGNOSIS — G43909 Migraine, unspecified, not intractable, without status migrainosus: Secondary | ICD-10-CM

## 2016-09-23 DIAGNOSIS — F329 Major depressive disorder, single episode, unspecified: Secondary | ICD-10-CM | POA: Diagnosis not present

## 2016-09-23 DIAGNOSIS — G479 Sleep disorder, unspecified: Secondary | ICD-10-CM | POA: Diagnosis not present

## 2016-09-23 DIAGNOSIS — G8929 Other chronic pain: Secondary | ICD-10-CM

## 2016-09-23 DIAGNOSIS — Z833 Family history of diabetes mellitus: Secondary | ICD-10-CM | POA: Diagnosis not present

## 2016-09-23 DIAGNOSIS — Z803 Family history of malignant neoplasm of breast: Secondary | ICD-10-CM | POA: Insufficient documentation

## 2016-09-23 DIAGNOSIS — Z825 Family history of asthma and other chronic lower respiratory diseases: Secondary | ICD-10-CM | POA: Insufficient documentation

## 2016-09-23 DIAGNOSIS — M542 Cervicalgia: Secondary | ICD-10-CM

## 2016-09-23 DIAGNOSIS — F419 Anxiety disorder, unspecified: Secondary | ICD-10-CM | POA: Diagnosis not present

## 2016-09-23 DIAGNOSIS — R269 Unspecified abnormalities of gait and mobility: Secondary | ICD-10-CM

## 2016-09-23 DIAGNOSIS — G960 Cerebrospinal fluid leak: Secondary | ICD-10-CM | POA: Insufficient documentation

## 2016-09-23 DIAGNOSIS — M791 Myalgia, unspecified site: Secondary | ICD-10-CM

## 2016-09-23 DIAGNOSIS — M5441 Lumbago with sciatica, right side: Secondary | ICD-10-CM | POA: Diagnosis not present

## 2016-09-23 DIAGNOSIS — Z8249 Family history of ischemic heart disease and other diseases of the circulatory system: Secondary | ICD-10-CM | POA: Diagnosis not present

## 2016-09-23 DIAGNOSIS — Z823 Family history of stroke: Secondary | ICD-10-CM | POA: Diagnosis not present

## 2016-09-23 MED ORDER — TRAMADOL HCL 50 MG PO TABS: 50.0000 mg | ORAL_TABLET | Freq: Two times a day (BID) | ORAL | 0 refills | Status: DC | PRN

## 2016-09-23 MED ORDER — LIDOCAINE 5 % EX PTCH: 1.0000 | MEDICATED_PATCH | CUTANEOUS | 0 refills | Status: DC

## 2016-09-23 NOTE — Progress Notes (Signed)
Subjective:    Patient ID: Linda Reid, female    DOB: Sep 18, 1985, 31 y.o.   MRN: [id number]  HPI 31 y/o female with pmh of migraines, chronic pain presents for follow up of pain in neck and back > lower back.  Started Aug 2016, during her 4th pregnancy.  Gotten progressively worse.  Rest improves the pain. Activity after rest improves the pain.  Sharp pain.  Radiates to her toes.  Intermittent throughout the day. She has associated numbness, tingling, weakness.  She has tried medications without benefit.  She had ESI, which exacerbated the symptoms and she had a CSF leak.  She has had 3 blood patches, but continues to have back pain. Her neck pain and migraines started after this point.  She had another blood patch Dec 14th, with ?improvement. She denies falls.  Pain limits her from doing ADLs, like putting on her clothes.  Pt currently works in [employer] doing a lot of ambulation.   Last clinic visit 08/26/16.  Since then she went to the ED for evaluation of swelling, which was diagnosed as neuropathy.  Notes reviewed. She also had EMG/NCS, which was normal per patient.  She also saw Neurosurg, who recommended follow with Neurosurg. She states now her main concern is her neck.  She was supposed to see Neurology with ?referral to [facility], but had to reschedule.  She has not been able to do pool therapy. Heat continues to help.  She sees Psychology next week.  She is taking Cymbalta PRN.  She is taking Gabapentin, which helps, but causes drowsiness.  Baclofen does not seem to help.     Pain Inventory Average Pain 6 Pain Right Now 5 My pain is intermittent, constant, sharp, tingling and aching  In the last 24 hours, has pain interfered with the following? General activity 7 Relation with others 4 Enjoyment of life 9 What TIME of day is your pain at its worst? all Sleep (in general) Poor  Pain is worse with: walking, bending, sitting, standing and some activites Pain improves with: rest and  medication Relief from Meds: 3  Mobility walk without assistance how many minutes can you walk? 20 ability to climb steps?  yes do you drive?  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  no nerve study  Physicians involved in your care Primary care . Neurologist Dr. Jaynee Eagles   Family History  Problem Relation Age of Onset  . Cancer Maternal Grandmother     breast  . Diabetes Maternal Grandmother   . Hypertension Maternal Grandmother   . Heart disease Maternal Grandfather   . Hypertension Maternal Grandfather   . Hypertension Paternal Grandmother   . Hypertension Paternal Grandfather   . Premature birth Daughter   . Asthma Daughter   . Diabetes Maternal Aunt   . Hypertension Maternal Aunt   . Stroke Maternal Aunt   . Diabetes Maternal Uncle   . Hypertension Maternal Uncle   . Hypertension Paternal Aunt   . Hypertension Paternal Uncle   . Headache Neg Hx    Social History   Social History  . Marital status: Married    Spouse name: Psychiatrist   . Number of children: 3  . Years of education: 4 Bachelor   Occupational History  . 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 Smoking status: Never Smoker  . Smokeless tobacco: Never Used  . Alcohol use No  . Drug use: No  . Sexual activity: Yes    Birth control/ protection: Implant   Other Topics Concern  . Not on file   Social History Narrative   Patient lives with her fiance and 3 children.    Her fiance is the father of her current pregnancy, other three children were fathered by her previous partner.    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     Caffeine use: none   Past Surgical History:  Procedure  Laterality Date  . DG MYLEOGRAM LUMBAR SPINE ([place] [facility])    . TONSILLECTOMY     Past Medical History:  Diagnosis Date  . Back pain   . Breast engorgement 07/03/2015  . Groin pain, chronic, right 07/14/2015   Has pain right groin area since about end of August,?swollen lymph node noted today,she said had [name] after baby born to R/O DVT   . History of mastitis 07/14/2015  . Mastitis 07/03/2015  . Preterm labor    BP 110/70   Pulse 73   SpO2 98%   Opioid Risk Score:   Fall Risk Score:  `1  Depression screen PHQ 2/9  Depression screen PHQ 2/9 08/26/2016  Decreased Interest 2  Down, Depressed, Hopeless 2  PHQ - 2 Score 4  Altered sleeping 3  Tired, decreased energy 3  Change in appetite 2  Feeling bad or failure about yourself  2  Trouble concentrating 1  Moving slowly or fidgety/restless 0  Suicidal thoughts 0  PHQ-9 Score 15  Difficult doing work/chores Very difficult    Review of Systems  HENT: Negative.   Eyes: Negative.   Respiratory: Negative.   Cardiovascular: Positive for leg swelling.  Gastrointestinal: Positive for nausea.  Endocrine: Negative.   Genitourinary: Negative.   Musculoskeletal: Positive for back pain, neck pain and neck stiffness.       Spasms   Skin: Negative.   Allergic/Immunologic: Negative.   Neurological: Positive for weakness, numbness and headaches.       Tingling  Hematological: Negative.   Psychiatric/Behavioral: Positive for dysphoric mood.  All other systems reviewed and are negative.     Objective:   Physical Exam Gen: NAD. Vital signs reviewed HENT: Normocephalic, Atraumatic Eyes: EOMI. No discharge.  Cardio: RRR. No JVD. Pulm: B/l clear to auscultation.  Effort normal Abd: Soft, BS+ MSK:  Gait antalgic.   TTP with minimal touch along lumbosacral PSPs and right thigh.    No edema.   Neg Tinel's at right lateral femoral cutaneous Neuro:   Sensation slightly diminished to light touch RLE vs LLE.   Reflexes hyperrefexic RLE  vs LLE  Strength  4/5 in all RLE myotomes (some pain inhibition)    5/5 in all LLE myotomes    SLR ?+RLE Skin: Warm and Dry. Intact    Assessment & Plan:  31 y/o female with pmh of migraines, chronic pain presents with pain in neck and back > lower back.    1. Chronic low back pain  CT 11/30 reviewed showing CSF leak L1-2. Disc bulge with facet arthropathy at L5-S1  MRI 01/2016 reviewed, facet arthropathy and L5-S1 foraminal narrowing  Encouraged pool therapy again  Cont heat/cold  Follow up with Psychology  Cymbalta 30, encouraged pt to take daily, consider increase  to 60 on next visit  Will decrease Gabapentin to 100 TID due to drowsiness  Cont Baclofen 5 TID PRN  Will defer facet injections at this due to pt fear and side effects  Will order Lidoderm patch to thigh for neuropathic pain  Will consider TENS in future  Will hold off on PT until pt better able to tolerate  Pt in process of follow up with [facility]  Will order tramadol 50 BID PRN (educated on signs/symptoms of serotonin syndrome)  2. Migraines  Currently being managed by Neurology  See #1  May consider Botox in future if appropriate  3. Neck pain  Started after ESI  See #1  4. Gait abnormality  Pt does not want assistive device at present  Pt in the process of trying to move to lower level unit  5. Sleep disturbance  See #1  Will consider Elavil in future, hold off for now due to sedation with other meds  6. Reactive anxiety/depression  Cymbalta started  7. Myalgia  Will consider trigger points in future, pt wants to hold off at present

## 2016-09-28 ENCOUNTER — Ambulatory Visit (INDEPENDENT_AMBULATORY_CARE_PROVIDER_SITE_OTHER): Payer: Medicaid Other | Admitting: Psychology

## 2016-09-28 DIAGNOSIS — Z029 Encounter for administrative examinations, unspecified: Secondary | ICD-10-CM

## 2016-09-28 DIAGNOSIS — F321 Major depressive disorder, single episode, moderate: Secondary | ICD-10-CM

## 2016-10-03 ENCOUNTER — Encounter (HOSPITAL_COMMUNITY): Payer: Self-pay | Admitting: Emergency Medicine

## 2016-10-03 ENCOUNTER — Emergency Department (HOSPITAL_COMMUNITY)
Admission: EM | Admit: 2016-10-03 | Discharge: 2016-10-04 | Disposition: A | Discharge status: Home / Self Care | Payer: Medicaid Other | Attending: Emergency Medicine | Admitting: Emergency Medicine

## 2016-10-03 DIAGNOSIS — E041 Nontoxic single thyroid nodule: Secondary | ICD-10-CM | POA: Diagnosis not present

## 2016-10-03 DIAGNOSIS — E0789 Other specified disorders of thyroid: Secondary | ICD-10-CM

## 2016-10-03 DIAGNOSIS — M542 Cervicalgia: Secondary | ICD-10-CM | POA: Diagnosis present

## 2016-10-03 NOTE — ED Notes (Signed)
Report to Charlotte, RN 

## 2016-10-03 NOTE — ED Notes (Addendum)
Followed by Dr Larose Hires, Dr Lavell Anchors and Dr Posey Pronto  For sinal fluid leaks as well as pain issues- with her spine- she has had several patches without relief-   Today noticed swelling around her thyroid area - there is a 2-3in swelling area to her suprasternal notch  She is alert, non photophobic

## 2016-10-03 NOTE — ED Triage Notes (Signed)
Patient has history of neck pain and spinal fluid leakage around cervical region of neck. States today she noticed a large swollen area at base of throat. Patient denies airway problems or tightness in throat.

## 2016-10-03 NOTE — ED Provider Notes (Signed)
By signing my name below, I, Eunice Blase, attest that this documentation has been prepared under the direction and in the presence of [place], DO. Electronically signed, Eunice Blase, ED Scribe. 10/03/16. 11:55 PM.   TIME SEEN: 2347  CHIEF COMPLAINT: Neck pain  HPI:  Linda Reid is a 31 y.o. female who presents to the Emergency Department complaining of a right sided lump on the front of the neck onset today. She reports associated posterior neck pain and stiffness since August 2017. Patient had lumbar radiculopathy and had an epidural injection at L5-S1 by Dr. Aline Brochure on 03/03/16. Afterwards continued to have back pain and headaches and was thought to have a cerebral spinal fluid leak. Underwent a blood patch at L5-S1 on 03/08/16. Subsequently had to return for a second blood patch on 05/28/16 for persistent CSF leak. Head CT myelogram 07/22/16 which showed she had a right L1-L2 CSF leak but never had an epidural injection this high. Had to have another blood patch in this area on 08/05/16. Reports chronic headache, neck and back pain since that time that is unchanged. Has had some numbness in her right leg because of her lumbar radiculopathy that has not changed. She states tonight she noticed some swelling to the right anterior part of her neck and this area was tender and it made her concerned. Pt denies fever, chills, new numbness or weakness, SOB, difficulty swallowing, sore throat or voice change. She denies weight loss, night sweats, palpitations, chest pain, dizziness, nausea or vomiting.  ROS: See HPI Constitutional: no fever  Eyes: no drainage  ENT: no runny nose   Cardiovascular:  no chest pain  Resp: no SOB  GI: no vomiting GU: no dysuria Integumentary: no rash  Allergy: no hives  Musculoskeletal: no leg swelling  Neurological: no slurred speech ROS otherwise negative  PAST MEDICAL HISTORY/PAST SURGICAL HISTORY:  Past Medical History:  Diagnosis Date  . Back  pain   . Breast engorgement 07/03/2015  . Groin pain, chronic, right 07/14/2015   Has pain right groin area since about end of August,?swollen lymph node noted today,she said had [name] after baby born to R/O DVT   . History of mastitis 07/14/2015  . Mastitis 07/03/2015  . Preterm labor     MEDICATIONS:  Prior to Admission medications   Medication Sig Start Date End Date Taking? Authorizing Provider  aspirin-acetaminophen-caffeine (EXCEDRIN MIGRAINE) [phone] MG tablet Take 1-2 tablets by mouth daily as needed for headache or migraine.   Yes Historical Provider, MD  baclofen (LIORESAL) 10 MG tablet Take 0.5 tablets (5 mg total) by mouth 3 (three) times daily as needed for muscle spasms. 08/26/16  Yes Ankit Lorie Phenix, MD  DULoxetine (CYMBALTA) 30 MG capsule Take 1 capsule (30 mg total) by mouth daily. 08/26/16  Yes Ankit Lorie Phenix, MD  etonogestrel (NEXPLANON) 68 MG IMPL implant 1 each by Subdermal route once.   Yes Historical Provider, MD  gabapentin (NEURONTIN) 100 MG capsule Take 200 mg by mouth 3 (three) times daily.   Yes Historical Provider, MD  lidocaine (LIDODERM) 5 % Place 1 patch onto the skin daily. Remove & Discard patch within 12 hours or as directed by MD 09/23/16  Yes Ankit Lorie Phenix, MD  traMADol (ULTRAM) 50 MG tablet Take 1 tablet (50 mg total) by mouth 2 (two) times daily as needed. 09/23/16  Yes Ankit Lorie Phenix, MD  gabapentin (NEURONTIN) 100 MG capsule Take 2 capsules (200 mg total) by mouth 3 (three) times daily.  08/26/16 09/25/16  Ankit Lorie Phenix, MD    ALLERGIES:  No Known Allergies  SOCIAL HISTORY:  Social History  Substance Use Topics  . Smoking status: Never Smoker  . Smokeless tobacco: Never Used  . Alcohol use No    FAMILY HISTORY: Family History  Problem Relation Age of Onset  . Cancer Maternal Grandmother     breast  . Diabetes Maternal Grandmother   . Hypertension Maternal Grandmother   . Heart disease Maternal Grandfather   . Hypertension Maternal  Grandfather   . Hypertension Paternal Grandmother   . Hypertension Paternal Grandfather   . Premature birth Daughter   . Asthma Daughter   . Diabetes Maternal Aunt   . Hypertension Maternal Aunt   . Stroke Maternal Aunt   . Diabetes Maternal Uncle   . Hypertension Maternal Uncle   . Hypertension Paternal Aunt   . Hypertension Paternal Uncle   . Headache Neg Hx     EXAM: BP 118/82   Pulse 60   Resp 17   SpO2 99%  CONSTITUTIONAL: Alert and oriented and responds appropriately to questions. Well-appearing; well-nourished HEAD: Normocephalic EYES: Conjunctivae clear, PERRL, EOMI ENT: normal nose; no rhinorrhea; moist mucous membranes NECK: Mild swelling and tenderness to the right side of the neck over the area of the thyroid without erythema, warmth, fluctuance or induration; no cervical adenopathy; no significant posterior midline spinal tenderness; no step-off or deformity; No pharyngeal erythema or petechiae, no tonsillar hypertrophy or exudate, no uvular deviation, no unilateral swelling, no trismus or drooling, no muffled voice, normal phonation, no stridor, no dental caries present, no drainable dental abscess noted, no Ludwig's angina, tongue sits flat in the bottom of the mouth, no angioedema, no facial erythema or warmth, no facial swelling; no pain with movement of the neck CARD: RRR; S1 and S2 appreciated; no murmurs, no clicks, no rubs, no gallops RESP: Normal chest excursion without splinting or tachypnea; breath sounds clear and equal bilaterally; no wheezes, no rhonchi, no rales, no hypoxia or respiratory distress, speaking full sentences ABD/GI: Normal bowel sounds; non-distended; soft, non-tender, no rebound, no guarding, no peritoneal signs, no hepatosplenomegaly BACK:  The back appears normal and is non-tender to palpation, there is no CVA tenderness EXT: Normal ROM in all joints; non-tender to palpation; no edema; normal capillary refill; no cyanosis, no calf tenderness  or swelling    SKIN: Normal color for age and race; warm; no rash NEURO: Slightly diminished sensation in the RLE but otherwise sensation to touch intact diffusely, cranial nerves II through XII intact, normal speech; Strength in all extremities 5/5, normal gait PSYCH: The patient's mood and manner are appropriate. Grooming and personal hygiene are appropriate.  MEDICAL DECISION MAKING: Patient here with anterior right-sided neck pain. Does appear to have a mass over her thyroid. Does not have any symptoms of thyroid storm or myxedema,. Will obtain CT scan of the soft tissues of her neck with contrast to further evaluate. Will obtain TSH, T4 and T3 levels. She declines pain medication at this time. Very low suspicion that this is a CSF leak. Discussed with her that I do not think there is any way that she could have a fluid collection in the anterior part of her neck from her spinal fluid. She denies any new neurologic deficits or new or worsening posterior neck or back pain. This seems to be a chronic issue and she is being followed closely by a neurologist for this. I do not feel she needs a  repeat CT myelogram or emergent anesthesia evaluation for blood patch.  ED PROGRESS: Patient's labs show TSH of 0.089. Free T3 and free T4 pending. Recommended she have her PCP follow-up with this. CT scan shows that she is a 48 mm nodule within the right lobe of the thyroid does displace the trachea leftward there is no airway compromise. Recommended nonemergent thyroid ultrasound. I do not feel she is to be started emergently on propanolol, methimazole but have recommended she call her primary care doctor first thing in the morning. Discussed with her return precautions including signs and symptoms of thyroid storm, thyrotoxicosis which again I do not feel she has currently. Again I do not think this is a CSF leak. She was given IV fluids and reports feeling better. Again declines any pain medication. Will provide  with work note. She is comfortable with this plan for outpatient follow-up.   At this time, I do not feel there is any life-threatening condition present. I have reviewed and discussed all results (EKG, imaging, lab, urine as appropriate) and exam findings with patient/family. I have reviewed nursing notes and appropriate previous records.  I feel the patient is safe to be discharged home without further emergent workup and can continue workup as an outpatient as needed. Discussed usual and customary return precautions. Patient/family verbalize understanding and are comfortable with this plan.  Outpatient follow-up has been provided. All questions have been answered.  I personally performed the services described in this documentation, which was scribed in my presence. The recorded information has been reviewed and is accurate.    Tye, DO 10/04/16 [phone]

## 2016-10-04 ENCOUNTER — Emergency Department (HOSPITAL_COMMUNITY): Payer: Medicaid Other

## 2016-10-04 ENCOUNTER — Encounter (HOSPITAL_COMMUNITY): Payer: Self-pay | Admitting: Radiology

## 2016-10-04 LAB — CBC WITH DIFFERENTIAL/PLATELET
BASOS ABS: 0 10*3/uL (ref 0.0–0.1)
BASOS PCT: 0 %
Eosinophils Absolute: 0.1 10*3/uL (ref 0.0–0.7)
Eosinophils Relative: 1 %
HEMATOCRIT: 39.8 % (ref 36.0–46.0)
HEMOGLOBIN: 13.4 g/dL (ref 12.0–15.0)
Lymphocytes Relative: 37 %
Lymphs Abs: 2.6 10*3/uL (ref 0.7–4.0)
MCH: 31.6 pg (ref 26.0–34.0)
MCHC: 33.7 g/dL (ref 30.0–36.0)
MCV: 93.9 fL (ref 78.0–100.0)
Monocytes Absolute: 0.4 10*3/uL (ref 0.1–1.0)
Monocytes Relative: 6 %
NEUTROS ABS: 3.9 10*3/uL (ref 1.7–7.7)
NEUTROS PCT: 56 %
Platelets: 236 10*3/uL (ref 150–400)
RBC: 4.24 MIL/uL (ref 3.87–5.11)
RDW: 12.2 % (ref 11.5–15.5)
WBC: 7 10*3/uL (ref 4.0–10.5)

## 2016-10-04 LAB — BASIC METABOLIC PANEL
ANION GAP: 8 (ref 5–15)
BUN: 9 mg/dL (ref 6–20)
CALCIUM: 8.8 mg/dL — AB (ref 8.9–10.3)
CHLORIDE: 104 mmol/L (ref 101–111)
CO2: 26 mmol/L (ref 22–32)
Creatinine, Ser: 0.74 mg/dL (ref 0.44–1.00)
GFR calc non Af Amer: >60 mL/min (ref >60)
Glucose, Bld: 86 mg/dL (ref 65–99)
Potassium: 3.3 mmol/L — ABNORMAL LOW (ref 3.5–5.1)
Sodium: 138 mmol/L (ref 135–145)

## 2016-10-04 LAB — I-STAT BETA HCG BLOOD, ED (MC, WL, AP ONLY)

## 2016-10-04 LAB — T4, FREE: FREE T4: 0.95 ng/dL (ref 0.61–1.12)

## 2016-10-04 LAB — TSH: TSH: 0.089 u[IU]/mL — ABNORMAL LOW (ref 0.350–4.500)

## 2016-10-04 MED ORDER — SODIUM CHLORIDE 0.9 % IV BOLUS (SEPSIS)
1000.0000 mL | Freq: Once | INTRAVENOUS | Status: AC
  Administered 2016-10-04: 1000 mL via INTRAVENOUS

## 2016-10-04 MED ORDER — IOPAMIDOL (ISOVUE-300) INJECTION 61%
75.0000 mL | Freq: Once | INTRAVENOUS | Status: AC | PRN
  Administered 2016-10-04: 75 mL via INTRAVENOUS

## 2016-10-04 NOTE — Discharge Instructions (Signed)
Please call your PCP to scheduled follow up and outpatient ultrasound of right 48 mm thyroid nodule.

## 2016-10-05 LAB — T3, FREE: T3, Free: 2.5 pg/mL (ref 2.0–4.4)

## 2016-10-07 ENCOUNTER — Telehealth: Payer: Self-pay | Admitting: Neurology

## 2016-10-07 NOTE — Telephone Encounter (Signed)
Pt was scheduled for 3/12 we had to r/s for Linda Reid to be out of the office that date. Pt given 3/28 but would like to be seen sooner than 3/12 if possible. Can we sue a work in appt slot the week 3/5.

## 2016-10-07 NOTE — Telephone Encounter (Signed)
Confirmed new appt per ahern 10/25/16 at 12 noon. cb

## 2016-10-07 NOTE — Telephone Encounter (Signed)
Please squeeze her in thanks

## 2016-10-19 ENCOUNTER — Ambulatory Visit (INDEPENDENT_AMBULATORY_CARE_PROVIDER_SITE_OTHER): Payer: Medicaid Other | Admitting: Psychology

## 2016-10-19 DIAGNOSIS — F321 Major depressive disorder, single episode, moderate: Secondary | ICD-10-CM | POA: Diagnosis not present

## 2016-10-21 ENCOUNTER — Encounter: Payer: Self-pay | Admitting: Physical Medicine & Rehabilitation

## 2016-10-21 ENCOUNTER — Encounter: Payer: Medicaid Other | Attending: Physical Medicine & Rehabilitation | Admitting: Physical Medicine & Rehabilitation

## 2016-10-21 VITALS — BP 106/74 | HR 71 | Resp 14

## 2016-10-21 DIAGNOSIS — Z8249 Family history of ischemic heart disease and other diseases of the circulatory system: Secondary | ICD-10-CM | POA: Diagnosis not present

## 2016-10-21 DIAGNOSIS — Z833 Family history of diabetes mellitus: Secondary | ICD-10-CM | POA: Diagnosis not present

## 2016-10-21 DIAGNOSIS — M545 Low back pain: Secondary | ICD-10-CM | POA: Insufficient documentation

## 2016-10-21 DIAGNOSIS — Z825 Family history of asthma and other chronic lower respiratory diseases: Secondary | ICD-10-CM | POA: Insufficient documentation

## 2016-10-21 DIAGNOSIS — F419 Anxiety disorder, unspecified: Secondary | ICD-10-CM | POA: Insufficient documentation

## 2016-10-21 DIAGNOSIS — G960 Cerebrospinal fluid leak: Secondary | ICD-10-CM | POA: Diagnosis not present

## 2016-10-21 DIAGNOSIS — M4697 Unspecified inflammatory spondylopathy, lumbosacral region: Secondary | ICD-10-CM | POA: Insufficient documentation

## 2016-10-21 DIAGNOSIS — M791 Myalgia, unspecified site: Secondary | ICD-10-CM

## 2016-10-21 DIAGNOSIS — M5441 Lumbago with sciatica, right side: Secondary | ICD-10-CM

## 2016-10-21 DIAGNOSIS — R269 Unspecified abnormalities of gait and mobility: Secondary | ICD-10-CM | POA: Insufficient documentation

## 2016-10-21 DIAGNOSIS — Z823 Family history of stroke: Secondary | ICD-10-CM | POA: Insufficient documentation

## 2016-10-21 DIAGNOSIS — Z803 Family history of malignant neoplasm of breast: Secondary | ICD-10-CM | POA: Insufficient documentation

## 2016-10-21 DIAGNOSIS — Z79899 Other long term (current) drug therapy: Secondary | ICD-10-CM | POA: Diagnosis not present

## 2016-10-21 DIAGNOSIS — F329 Major depressive disorder, single episode, unspecified: Secondary | ICD-10-CM | POA: Diagnosis not present

## 2016-10-21 DIAGNOSIS — G8929 Other chronic pain: Secondary | ICD-10-CM | POA: Insufficient documentation

## 2016-10-21 DIAGNOSIS — M542 Cervicalgia: Secondary | ICD-10-CM | POA: Insufficient documentation

## 2016-10-21 DIAGNOSIS — G43909 Migraine, unspecified, not intractable, without status migrainosus: Secondary | ICD-10-CM | POA: Insufficient documentation

## 2016-10-21 DIAGNOSIS — G479 Sleep disorder, unspecified: Secondary | ICD-10-CM | POA: Insufficient documentation

## 2016-10-21 MED ORDER — LIDOCAINE 5 % EX PTCH: 1.0000 | MEDICATED_PATCH | CUTANEOUS | 1 refills | Status: AC

## 2016-10-21 NOTE — Progress Notes (Signed)
Subjective:    Patient ID: Linda Reid, female    DOB: 1985/11/03, 31 y.o.   MRN: [id number]  HPI 31 y/o female with pmh of migraines, chronic pain presents for follow up of pain in neck and back > lower back.   Initially stated: Started Aug 2016, during her 4th pregnancy.  Gotten progressively worse.  Rest improves the pain. Activity after rest improves the pain.  Sharp pain.  Radiates to her toes.  Intermittent throughout the day. She has associated numbness, tingling, weakness.  She has tried medications without benefit.  She had ESI, which exacerbated the symptoms and she had a CSF leak.  She has had 3 blood patches, but continues to have back pain. Her neck pain and migraines started after this point.  She had another blood patch Dec 14th, with ?improvement. She denies falls.  Pain limits her from doing ADLs, like putting on her clothes.  She saw Psychology. Pt currently works in [place] doing a lot of ambulation.   Last clinic visit 09/23/16.  Since that time, she went to the ED, notes reviewed and she was told that she has a thyroid goiter. She not has trouble with swallowing.  She is taking the Cymbalta and doing better. She is also doing better with taking the Gabapentin, with little improvement. She continues to Baclofen, but does not notice much benefit.  Lidoderm patches are helping.  Pt still has not been to [facility] because she is awaiting her Neurology appointment.  Tramadol also helps, but makes her drowsy. She was able to go to pool therapy, but does not notice much difference. She now states the pain has started in her LLE as well.   Pain Inventory Average Pain 6 Pain Right Now 8 My pain is sharp, stabbing, tingling and aching  In the last 24 hours, has pain interfered with the following? General activity 7 Relation with others 5 Enjoyment of life 9 What TIME of day is your pain at its worst? all Sleep (in general) Poor  Pain is worse with: walking, bending, sitting and  standing Pain improves with: rest and medication Relief from Meds: 3  Mobility walk without assistance how many minutes can you walk? 30 ability to climb steps?  no do you drive?  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  no nerve study  Physicians involved in your care Primary care . Neurologist Dr. Jaynee Eagles   Family History  Problem Relation Age of Onset  . Cancer Maternal Grandmother     breast  . Diabetes Maternal Grandmother   . Hypertension Maternal Grandmother   . Heart disease Maternal Grandfather   . Hypertension Maternal Grandfather   . Hypertension Paternal Grandmother   . Hypertension Paternal Grandfather   . Premature birth Daughter   . Asthma Daughter   . Diabetes Maternal Aunt   . Hypertension Maternal Aunt   . Stroke Maternal Aunt   . Diabetes Maternal Uncle   . Hypertension Maternal Uncle   . Hypertension Paternal Aunt   . Hypertension Paternal Uncle   . Headache Neg Hx    Social History   Social History  . Marital status: Married    Spouse name: Psychiatrist   . Number of children: 3  . Years of education: 4 Bachelor  Occupational History  . 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 Smoking status: Never Smoker  . Smokeless tobacco: Never Used  . Alcohol use No  . Drug use: No  . Sexual activity: Yes    Birth control/ protection: Implant   Other Topics Concern  . None   Social History Narrative   Patient lives with her fiance and 3 children.    Her fiance is the father of her current pregnancy, other three children were fathered by her previous partner.    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     Caffeine use: none   Past Surgical History:  Procedure Laterality Date  . DG MYLEOGRAM LUMBAR SPINE ([place] [facility]X)    . TONSILLECTOMY     Past Medical History:  Diagnosis Date  . Back pain   . Breast engorgement 07/03/2015  . Groin pain, chronic, right 07/14/2015   Has pain right groin area since about end of August,?swollen lymph node noted today,she said had [name] after baby born to R/O DVT   . History of mastitis 07/14/2015  . Mastitis 07/03/2015  . Preterm labor    BP 106/74   Pulse 71   Resp 14   SpO2 98%   Opioid Risk Score:   Fall Risk Score:  `1  Depression screen PHQ 2/9  Depression screen [facility] 2/9 10/21/2016 08/26/2016  Decreased Interest 2 2  Down, Depressed, Hopeless 2 2  PHQ - 2 Score 4 4  Altered sleeping - 3  Tired, decreased energy - 3  Change in appetite - 2  Feeling bad or failure about yourself  - 2  Trouble concentrating - 1  Moving slowly or fidgety/restless - 0  Suicidal thoughts - 0  PHQ-9 Score - 15  Difficult doing work/chores - Very difficult    Review of Systems  Constitutional: Positive for appetite change, chills and fatigue.  HENT: Negative.   Eyes: Negative.   Respiratory: Positive for cough.   Cardiovascular: Positive for leg swelling.  Gastrointestinal: Positive for nausea.  Endocrine: Negative.   Genitourinary: Negative.   Musculoskeletal: Positive for back pain, neck pain and neck stiffness.       Spasms   Skin: Negative.   Allergic/Immunologic: Negative.   Neurological: Positive for weakness, numbness and headaches.       Tingling  Hematological: Negative.   Psychiatric/Behavioral: Positive for dysphoric mood.  All other systems reviewed and are negative.     Objective:   Physical Exam Gen: NAD. Vital signs reviewed HENT: Normocephalic, Atraumatic Eyes: EOMI. No discharge.  Cardio: RRR. No JVD. Pulm: B/l clear to auscultation.  Effort normal Abd: Soft, BS+ MSK:  Gait  antalgic.   TTP with minimal touch along thoracolumbar PSPs and right thigh.    No edema.  Neuro:   Sensation slightly diminished to light touch RLE vs LLE.   Reflexes hyperrefexic RLE vs LLE  Strength  4/5 in all RLE myotomes (some pain inhibition)    5/5 in all LLE myotomes    SLR ?+RLE Skin: Warm and Dry. Intact    Assessment & Plan:  31 y/o female with pmh of migraines, chronic pain presents for follow up of pain in neck and back > lower back.    1. Chronic low back pain  CT 11/30 reviewed showing CSF leak L1-2.  Disc bulge with facet arthropathy at L5-S1, MRI 01/2016 reviewed, facet arthropathy and L5-S1 foraminal narrowing  Cont pool therapy  Cont heat/cold  Cont follow up with Psychology  Cont Cymbalta 30, encouraged pt to take daily, consider increase to 60 on next visit  Cont Gabapentin to 100 TID due to drowsiness  Cont Baclofen 5 TID PRN  Cont Lidoderm patches  Cont tramadol 50 BID PRN  Will order TENS unit after eval for thryoid  Will defer facet injections at this due to pt fear and side effects  Will hold off on PT until pt better able to tolerate  Pt in process of follow up with [facility]  Will order repeat MRI due to worsening of symptoms  Pt with worsening of symptoms, may be more system in nature.  Will await eval from [facility] and determine plan of care.  Pt may need referral to Rheumatology  2. Migraines  Currently being managed by Neurology  See #1  May consider Botox in future if appropriate  3. Neck pain  Started after ESI  See #1  4. Gait abnormality  Pt does not want assistive device at present  Pt in the process of trying to move to lower level unit  5. Sleep disturbance  See #1  Will consider Elavil in future, hold off for now due to sedation with other meds  6. Reactive anxiety/depression  Cont Cymbalta   7. Myalgia  Will perfrom trigger point injections   Trigger point injection procedure note: Trigger Point Injection: Written consent was  obtained for the patient. Trigger points were identified of the right thoracic and lumbar paraspinals. The areas were cleaned with alcohol, and each of  these trigger points were sprayed with vapocoolant and injected with 1 cc of of 0.5% Marcaine. Needle draw back was performed. There were no complications from the procedure, and it was well tolerated.

## 2016-10-25 ENCOUNTER — Encounter: Payer: Self-pay | Admitting: Neurology

## 2016-10-25 ENCOUNTER — Ambulatory Visit (INDEPENDENT_AMBULATORY_CARE_PROVIDER_SITE_OTHER): Payer: Medicaid Other | Admitting: Neurology

## 2016-10-25 VITALS — BP 126/81 | HR 74 | Ht 69.0 in | Wt 164.6 lb

## 2016-10-25 DIAGNOSIS — R29898 Other symptoms and signs involving the musculoskeletal system: Secondary | ICD-10-CM

## 2016-10-25 DIAGNOSIS — M5412 Radiculopathy, cervical region: Secondary | ICD-10-CM | POA: Diagnosis not present

## 2016-10-25 DIAGNOSIS — G9600 Cerebrospinal fluid leak, unspecified: Secondary | ICD-10-CM

## 2016-10-25 DIAGNOSIS — R202 Paresthesia of skin: Secondary | ICD-10-CM | POA: Diagnosis not present

## 2016-10-25 DIAGNOSIS — M5416 Radiculopathy, lumbar region: Secondary | ICD-10-CM | POA: Diagnosis not present

## 2016-10-25 DIAGNOSIS — R200 Anesthesia of skin: Secondary | ICD-10-CM

## 2016-10-25 DIAGNOSIS — R51 Headache with orthostatic component, not elsewhere classified: Secondary | ICD-10-CM

## 2016-10-25 DIAGNOSIS — M542 Cervicalgia: Secondary | ICD-10-CM

## 2016-10-25 MED ORDER — ROTIGOTINE 1 MG/24HR TD PT24: 1.0000 mg | MEDICATED_PATCH | Freq: Every day | TRANSDERMAL | 6 refills | Status: DC

## 2016-10-25 NOTE — Progress Notes (Addendum)
[facility]    Provider:  Dr Jaynee Eagles Referring Provider: Raiford Simmonds., PA-C Primary Care Physician:  Raiford Simmonds., PA-C  CC:  headache  Interval history 10/25/2016: CT myelogram revealed a csf leak and patient was treated with blood patch (her third) at the site of the leak. She still continues to have symptoms, she has the headaches as well as a sharp intense pain 3-4 times a week on the right around the eye. She continues to have neck pain and pressure in the neck and in the head similar to what she felt after the LP and csf leak. She feels like something is "pulling". Her neck is sore. The neck starts to hurt when sitting for long periods of time. The pain is in the neck and shoulder and she has radiation into the left arm. Will order an MRI cervical spine. She has left arm weakness. Will order an emg/ncs, also physical therapy. If no etiology found then may refer to [facility] dr gray for further eval of csf leaks especially given the root sleeve cysts which could be sites of spontaneous csf leaks.  FINDINGS: CERVICAL, THORACIC, AND LUMBAR MYELOGRAM FINDINGS:  The nerve roots fill normally throughout the lumbar spine. There somewhat bulbous bilaterally at S2. There is prominence of the nerve root sheath at L5 on the left and L2 and L3 on the right. No definite leak is evident on the conventional images.  No significant thoracic disc herniation is evident. No focal leak of contrast is evident within the thoracic spine.  A mild disc osteophyte complex is present with slight blunting of the left-sided nerve root sheath at C5-6. Nerve root she is otherwise fill normally on both sides. Cervical vertebral body height and alignment are normal.  CT CERVICAL MYELOGRAM FINDINGS:  Seven cervical type vertebral bodies are present. Vertebral body heights alignment are maintained. Minimal uncovertebral spurring is present bilaterally at C5-6. There is prominence of  the nerve sheath on the right at C2-3 and on the right at C5-6. No focal CSF leak is evident.  No focal disc herniation or stenosis is present.  CT LUMBAR MYELOGRAM FINDINGS:  Five non rib-bearing lumbar type vertebral bodies are present. Vertebral body heights alignment are maintained.  Limited imaging of the abdomen is unremarkable. The conus medullaris terminates at L1-2, within normal limits.  Mild facet hypertrophy is present at L2-3, L3-4, L4-5, and L5-S1.  Contrast can be seen leaking around the right L1 nerve root at the L1-2 level there is slight prominence of the nerve root sheath on the left.  L1-2: Prominent perineural root sleeve cysts are noted bilaterally.  L3-4: Prominent perineural root sleeve cysts are present bilaterally.  L4-5: A mild broad-based disc protrusion is present without significant stenosis.  L5-S1: Mild disc bulging and facet hypertrophy are present. Moderate foraminal stenosis is worse on the left.  CT THORACIC MYELOGRAM FINDINGS:  Twelve rib-bearing thoracic type vertebral bodies are present. Vertebral body heights and alignment are normal. There is no focal disc protrusion. Asymmetric facet hypertrophy on the right results in mild right foraminal stenosis at T10-11. Contrast is noted along the nerve root sheath on the right at T12-L1. No other focal foraminal contrast is present in the thoracic spine.  IMPRESSION: 1. CSF leak on the right at L1-2. This may be associated with the patient's headaches. Epidural blood patch could be performed at this level. 2. Prominent perineural root sleeve cysts in the lumbar and cervical spine without contrast leak elsewhere. Bulbous perineural  root sleeve cysts can predispose to spontaneous CSF leak. 3. Mild disc bulging and moderate facet hypertrophy at L5-S1 with moderate foraminal stenosis bilaterally, worse on the left. 4. Mild right foraminal narrowing at T10-11 secondary to  facet spurring.    HPI:  Linda Reid is a 31 y.o. female here as a referral from Dr. Darleene Cleaver for headaches. Past medical history of  depressive disorder, preterm labor, abdominal pain. Patient has ongoing headaches since epidural steroid injection.Never had headaches before ESI. ESI was in July 2017 on a Wednesday, the next day had a headaches, worsening, it was excruciating, it was better laying down and worse sitting or standing definitely positional, she felt like she was going to pass out. It was mostly on the back of the head towards the neck. She had a blood patch on the 17th which helped but did not resolved the headache. She started having a lot of neck pain, stiffness and aching, never had double vision or facial weakness (no cranial neuropathies), headache made her feel fatigued. The headache was still there albeit a little better but more random, still every day, wasn;t as severe but was still having them every day. Headache continued for months without improvement, she eventually had a second blood patch without anymore improvement. Currently she still has neck pain, generalized weakness, heaviness of the head centered at the neck and mid back but no arm weakness or radicular symptoms. Headache now is worse when she is up and moving around for a while, still better with laying down, less severe positionally but there still is a definitive positional component. Throughout the day it is more of a pressure in a band around the head, she has had some blurry vision with the headaches,dizziness,  headaches are daily, waxing and waning worse during the day when upright mostly better in the morning but still there. On average is a 8/10 pain and discomfort. She had to stop coaching volleyball, headaches significantly affecting her daily life. Her profession is security. OTC meds have not helped, ibuprofen/tylenol,excedrin, indomethacin. Also continued LBP with radicular symptoms, ESI did not improve  her L5/S1 radicular pain and this is making her security job difficult, no other associated symptoms, modifying factors or focal deficits. + nausea. No current vomiting, diplopia (but does endorse vision changes blurred vision), facial weakness, photophobia.    Reviewed notes, labs and imaging from outside physicians, which showed:  Patient had a successful epidural steroid injection 03/03/2016 where Depo-Medrol and lidocaine via an interlaminar approach was performed on the right L5-S1 which was successful the procedure was well tolerated and the patient was discharged 30 minutes following the injection and good condition. Patient developed a post epidural headache and that a technically successful lumbar epidural blood patch on the right at L5-S1 on 03/08/2016. On 05/28/2016 a lumbar epidural blood patch was repeated due to continued headache. Both blood patches were successful on the right at L5-S1.    BUN 14 and creatinine is 0.93 03/27/2016  Personally reviewed MRI images of the lumbar spine June 2017 and agree with the following: Diffuse lumbar facet degeneration without significant spinal stenosis  Right foraminal narrowing L5-S1 without nerve root compression.  Personally reviewed CT of the head images on 03/27/2016 which showed no acute intracranial pathology. CT showed no acute intracranial abnormalities including mass lesion or mass effect, hydrocephalus, extra-axial fluid collection, midline shift, hemorrhage, or acute infarction, large ischemic events (personally reviewed images).      Review of Systems: Patient complains of symptoms  per HPI as well as the following symptoms: Weight loss, fatigue, blurred vision, swelling in legs, joint pain, joint swelling, cramps, aching muscles, headache, numbness, weakness, insomnia, not enough sleep, change in appetite. Pertinent negatives per HPI. All others negative.    Social History   Social History  . Marital  status: Married    Spouse name: Psychiatrist   . Number of children: 3  . Years of education: 4 Bachelor   Occupational History  . 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 Smoking status: Never Smoker  . Smokeless tobacco: Never Used  . Alcohol use No  . Drug use: No  . Sexual activity: Yes    Birth control/ protection: Implant   Other Topics Concern  . Not on file   Social History Narrative   Patient lives with her fiance and 3 children.    Her fiance is the father of her current pregnancy, other three children were fathered by her previous partner.    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     Caffeine use: none   Right-handed    Family History  Problem Relation Age of Onset  . Cancer Maternal Grandmother     breast  . Diabetes Maternal Grandmother   . Hypertension Maternal Grandmother   . Heart disease Maternal Grandfather   . Hypertension Maternal Grandfather   . Hypertension Paternal Grandmother   . Hypertension Paternal Grandfather   . Premature birth Daughter   . Asthma Daughter   . Diabetes Maternal Aunt   . Hypertension Maternal Aunt   . Stroke Maternal Aunt   . Diabetes Maternal Uncle   . Hypertension Maternal Uncle   . Hypertension Paternal Aunt   . Hypertension Paternal Uncle   . Headache Neg Hx     Past Medical History:  Diagnosis Date  . Back pain   . Breast engorgement 07/03/2015  . Fall   . Groin pain, chronic, right 07/14/2015   Has pain right groin area since about end of August,?swollen lymph node noted today,she said had [name] after baby born to R/O DVT   . History of mastitis 07/14/2015  . Hyperthyroidism   . Mastitis 07/03/2015  . Preterm labor     Past Surgical History:  Procedure Laterality Date  . DG MYLEOGRAM LUMBAR SPINE ([place] [facility]X)    . TONSILLECTOMY      Current  Outpatient Prescriptions  Medication Sig Dispense Refill  . aspirin-acetaminophen-caffeine (EXCEDRIN MIGRAINE) 250-250-65 MG tablet Take 1-2 tablets by mouth daily as needed for headache or migraine.    . baclofen (LIORESAL) 10 MG tablet Take 0.5 tablets (5 mg total) by mouth 3 (three) times daily as needed for muscle spasms. 45 each 1  . DULoxetine (CYMBALTA) 30 MG capsule Take 1 capsule (30 mg total) by mouth daily. 30 capsule 1  . etonogestrel (NEXPLANON) 68 MG IMPL implant 1 each by Subdermal route once.    . gabapentin (NEURONTIN) 100 MG capsule Take 200 mg by mouth 3 (three) times daily.    Marland Kitchen lidocaine (LIDODERM) 5 % Place 1 patch onto the skin daily. Remove & Discard patch within 12 hours or as directed by MD 30 patch 1  . methimazole (TAPAZOLE) 5 MG tablet Take 5 mg by mouth daily.    . traMADol (ULTRAM) 50 MG tablet Take 1  tablet (50 mg total) by mouth 2 (two) times daily as needed. 60 tablet 0   No current facility-administered medications for this visit.     Allergies as of 10/25/2016  . (No Known Allergies)    Vitals: BP 126/81   Pulse 74   Ht 5\' 9"  (1.753 m)   Wt 164 lb 9.6 oz (74.7 kg)   BMI 24.31 kg/m  Last Weight:  Wt Readings from Last 1 Encounters:  10/25/16 164 lb 9.6 oz (74.7 kg)   Last Height:   Ht Readings from Last 1 Encounters:  10/25/16 5\' 9"  (1.753 m)   Physical exam: Exam: Gen: NAD, conversant, well nourised, obese, well groomed                     CV: RRR, no MRG. No Carotid Bruits. No peripheral edema, warm, nontender Eyes: Conjunctivae clear without exudates or hemorrhage  Neuro: Detailed Neurologic Exam  Speech:    Speech is normal; fluent and spontaneous with normal comprehension.  Cognition:    The patient is oriented to person, place, and time;     recent and remote memory intact;     language fluent;     normal attention, concentration,     fund of knowledge Cranial Nerves:    The pupils are equal, round, and reactive to light. The  fundi are normal and spontaneous venous pulsations are present. Visual fields are full to finger confrontation. Extraocular movements are intact. Trigeminal sensation is intact and the muscles of mastication are normal. The face is symmetric. The palate elevates in the midline. Hearing intact. Voice is normal. Shoulder shrug is normal. The tongue has normal motion without fasciculations.   Coordination:    Normal finger to nose and heel to shin. Normal rapid alternating movements.   Gait:    Heel-toe and tandem gait are normal.   Motor Observation:    No asymmetry, no atrophy, and no involuntary movements noted. Tone:    Normal muscle tone.    Posture:    Posture is normal. normal erect    Strength: Left arm triceps weakness. Otherwise strength is V/V in the upper and lower limbs.      Sensation: intact to LT     Reflex Exam:  DTR's:    Deep tendon reflexes in the upper and lower extremities are normal bilaterally.   Toes:    The toes are downgoing bilaterally.   Clonus:    Clonus is absent.    Assessment/Plan:  31 year old female with an orthostatic headache and neck pain likely a low-pressure headache, positional, refractory, after 2 epidural steroid injections. Has had 2 blood patches without complete relief. CT Myelogram showed CSF leak on the right at L1-2 with a 3rd blood patch. She continues to have headache and neck pain. Will refer to [facility] dr gray for further eval of csf leaks refractory to blood patch especially given the prominent perineural root sleeve cysts in the lumbar and cervical Spine; Bulbous perineural root sleeve cysts can predispose to spontaneous CSF leak.Marland Kitchen   Cc: Dr. Jobe Igo, Dr. Maren Reamer, MD  [facility] [address] [place], [place] [address]  Phone [phone] Fax [phone]  A total of 25 minutes was spent face-to-face with this patient. Over half this time was spent on counseling patient on the  low-pressure headache and neck pain due to csf leak  diagnosis and different diagnostic and therapeutic options available.

## 2016-10-25 NOTE — Patient Instructions (Addendum)
Remember to drink plenty of fluid, eat healthy meals and do not skip any meals. Try to eat protein with a every meal and eat a healthy snack such as fruit or nuts in between meals. Try to keep a regular sleep-wake schedule and try to exercise daily, particularly in the form of walking, 20-30 minutes a day, if you can.   As far as your medications are concerned, I would like to suggest: Neupro Patch  As far as diagnostic testing: MRI cervical spine and emg/ncs left arm and left leg  I would like to see you back for emg/ncs, sooner if we need to. Please call us with any interim questions, concerns, problems, updates or refill requests.   Our phone number is [phone]. We also have an after hours call service for urgent matters and there is a physician on-call for urgent questions. For any emergencies you know to call 911 or go to the nearest emergency room  Rotigotine transdermal skin patch What is this medicine? ROTIGOTINE (roe TIG oh teen) is used to control the signs and symptoms of Parkinson's disease or restless legs syndrome. This medicine may be used for other purposes; ask your health care provider or pharmacist if you have questions. COMMON BRAND NAME(S): Neupro What should I tell my health care provider before I take this medicine? They need to know if you have any of these conditions: -heart disease -high blood pressure -lung or breathing disease, like asthma -mental illness -skin cancer -sleep disorder -an unusual or allergic reaction to rotigotine, sulfites, other medicines, foods, dyes, or preservatives -pregnant or trying to get pregnant -breast-feeding How should I use this medicine? This medicine is for external use only. Follow the directions on the prescription label. Use exactly as directed. Wash hands after removing and applying this medicine. Change the patch each day at the same time. Apply the patch to an area of the upper arm or body that is clean, dry, and  hairless. Do not use this patch on skin that is injured, irritated, oily, or calloused. Do not apply where the patch will be rubbed by tight clothing or a waistband. Do not apply to the same place more than once every 14 days in order to prevent skin irritation. Do not cut or trim the patch. Take your medicine at regular intervals. Do not take it more often than directed. Do not stop taking except on your doctor's advice. Always remove the old patch before you apply a new one. Remove patch slowly and carefully to avoid irritation. After removal, fold the patch so that it sticks to itself and throw it away. After removal of patch, wash the area with soap and water to remove any drug or adhesive. Baby oil or mineral oil may be used if needed. Do not use alcohol or other liquids. Talk to your pediatrician regarding the use of this medicine in children. Special care may be needed. Overdosage: If you think you have taken too much of this medicine contact a poison control center or emergency room at once. NOTE: This medicine is only for you. Do not share this medicine with others. What if I miss a dose? If you miss a dose, take it as soon as you can. If it is almost time for your next dose, take only that dose. Do not take double or extra doses. What may interact with this medicine? -alcohol -antihistamines for allergy, cough and cold -certain medicines for sleep -medicines for depression, anxiety, or psychotic disturbances -metoclopramide -narcotic  medicines for pain This list may not describe all possible interactions. Give your health care provider a list of all the medicines, herbs, non-prescription drugs, or dietary supplements you use. Also tell them if you smoke, drink alcohol, or use illegal drugs. Some items may interact with your medicine. What should I watch for while using this medicine? Visit your doctor for regular check ups. Tell your doctor or healthcare professional if your symptoms do not  start to get better or if they get worse. You may get drowsy or dizzy. Do not drive, use machinery, or do anything that needs mental alertness until you know how this medicine affects you. Do not stand or sit up quickly, especially if you are an older patient. This reduces the risk of dizzy or fainting spells. Alcohol may interfere with the effect of this medicine. Avoid alcoholic drinks. If you find that you have sudden feelings of wanting to sleep during normal activities, like cooking, watching television, or while driving or riding in a car, you should contact your health care professional. There have been reports of increased sexual urges or other strong urges such as gambling while taking this medicine. If you experience any of these while taking this medicine, you should report this to your health care provider as soon as possible. This medicine patch is sensitive to certain body heat changes. If your skin gets too hot, more medicine will come out of the patch. Call your healthcare provider if you get a fever. Do not take hot baths. Do not sunbathe. Do not use hot tubs, saunas, hair dryers, heating pads, electric blankets, heated waterbeds, or tanning lamps. Do not do exercise that increases your body temperature. If you are going to have a magnetic resonance imaging (MRI) procedure, tell your MRI technician if you have this patch on your body. It must be removed before a MRI. What side effects may I notice from receiving this medicine? Side effects that you should report to your doctor or health care professional as soon as possible: -allergic reactions like skin rash, itching or hives, swelling of the face, lips, or tongue -anxiety, restlessness -breathing problems -confusion -dizziness -falling asleep during normal activities like driving -fast, irregular or slow heartbeat -feeling faint or lightheaded, falls -hallucination, loss of contact with reality -skin irritation, redness, or  swelling -uncontrollable head, mouth, neck, arm, or leg movements -uncontrollable and excessive urges (examples: gambling, binge eating, shopping, having sex) Side effects that usually do not require medical attention (report to your doctor or health care professional if they continue or are bothersome): -constipation -difficulty sleeping -headache -loss of appetite -nausea, vomiting -stomach pain -weight gain This list may not describe all possible side effects. Call your doctor for medical advice about side effects. You may report side effects to FDA at 1-800-FDA-1088. Where should I keep my medicine? Keep out of the reach of children. Store at room temperature between 15 and 30 degrees C (59 and 86 degrees F). Keep container tightly closed. Store in original pouch until just before use. Throw away any unused medicine after the expiration date. NOTE: This sheet is a summary. It may not cover all possible information. If you have questions about this medicine, talk to your doctor, pharmacist, or health care provider.  2018 Elsevier/Gold Standard (2016-03-19 15:25:23)

## 2016-10-26 ENCOUNTER — Ambulatory Visit: Payer: Medicaid Other | Admitting: Psychology

## 2016-10-28 ENCOUNTER — Encounter: Payer: Medicaid Other | Admitting: Physical Medicine & Rehabilitation

## 2016-11-01 ENCOUNTER — Ambulatory Visit (INDEPENDENT_AMBULATORY_CARE_PROVIDER_SITE_OTHER): Payer: Self-pay | Admitting: Otolaryngology

## 2016-11-01 ENCOUNTER — Ambulatory Visit: Payer: Self-pay | Admitting: Neurology

## 2016-11-01 ENCOUNTER — Ambulatory Visit: Payer: Medicaid Other | Admitting: Psychology

## 2016-11-03 ENCOUNTER — Ambulatory Visit (INDEPENDENT_AMBULATORY_CARE_PROVIDER_SITE_OTHER): Payer: Self-pay | Admitting: Neurology

## 2016-11-03 ENCOUNTER — Ambulatory Visit (INDEPENDENT_AMBULATORY_CARE_PROVIDER_SITE_OTHER): Payer: Medicaid Other | Admitting: Neurology

## 2016-11-03 DIAGNOSIS — M79605 Pain in left leg: Secondary | ICD-10-CM

## 2016-11-03 DIAGNOSIS — M542 Cervicalgia: Secondary | ICD-10-CM

## 2016-11-03 DIAGNOSIS — M5412 Radiculopathy, cervical region: Secondary | ICD-10-CM | POA: Diagnosis not present

## 2016-11-03 DIAGNOSIS — R202 Paresthesia of skin: Secondary | ICD-10-CM

## 2016-11-03 DIAGNOSIS — Z0289 Encounter for other administrative examinations: Secondary | ICD-10-CM

## 2016-11-03 DIAGNOSIS — R200 Anesthesia of skin: Secondary | ICD-10-CM

## 2016-11-03 DIAGNOSIS — M79602 Pain in left arm: Secondary | ICD-10-CM

## 2016-11-03 DIAGNOSIS — R29898 Other symptoms and signs involving the musculoskeletal system: Secondary | ICD-10-CM

## 2016-11-03 DIAGNOSIS — M5416 Radiculopathy, lumbar region: Secondary | ICD-10-CM

## 2016-11-03 NOTE — Addendum Note (Signed)
Addended by: Sarina Ill B on: 11/03/2016 06:22 PM   Modules accepted: Orders

## 2016-11-03 NOTE — Procedures (Signed)
Full Name: Linda Reid Gender: Female MRN #: [id number] Date of Birth: 1985-09-18    Visit Date: 11/03/2016 11:21 Age: 31 Years 21 Months Old Examining Physician: Sarina Ill, MD  Referring Physician: Jaynee Eagles  History: Patient with left arm and left leg radicular symptoms.  Summary:   All muscles (as indicated in the following table) were normal.    All nerves (as indicated in the following table) were normal.      Conclusion: This is a normal emg/ncs of the left lower and left upper extremity.   Sarina Ill, M.D.  [facility] [place], [place] [address] Tel: [phone] Fax: [phone]        [facility]    Nerve / Sites Rec. Site Peak Lat Amp Segments Distance    ms V  cm  L Median - Orthodromic (Dig II, Mid palm)     Dig II Wrist 2.9 41 Dig II - Wrist 13  L Ulnar - Orthodromic, (Dig V, Mid palm)     Dig V Wrist 2.7 13 Dig V - Wrist 11         SNC    Nerve / Sites Rec. Site Peak Lat Ref.  Amp Ref. Segments Distance    ms ms V V  cm  L Sural - Ankle (Calf)     Calf Ankle 3.8 ?4.4 24 ?6 Calf - Ankle 14  L Superficial peroneal - Ankle     Lat leg Ankle 4.2 ?4.4 9 ?6 Lat leg - Ankle 14         MNC    Nerve / Sites Rec. Site Latency Ref. Amplitude Ref. Rel Amp Segments Distance Velocity Ref. Area    ms ms mV mV %  cm m/s m/s mVms  L Median - APB     Wrist APB 3.3 ?4.4 13.5 ?4.0 100 Wrist - APB 7   58.7     Upper arm APB 7.0  13.6  100 Upper arm - Wrist 22 60 ?49 58.5  L Ulnar - ADM     Wrist ADM 2.4 ?3.3 11.9 ?6.0 100 Wrist - ADM 7   36.5     B.Elbow ADM 5.4  10.9  92.3 B.Elbow - Wrist 18 62 ?49 33.6     A.Elbow ADM 6.8  10.4  95.5 A.Elbow - B.Elbow 9 62 ?49 32.8         A.Elbow - Wrist      L Peroneal - EDB     Ankle EDB 4.7 ?6.5 5.5 ?2.0 100 Ankle - EDB 9   17.7     Fib head EDB 10.6  5.6  102 Fib head - Ankle 29 49 ?44 18.2     Pop fossa EDB 12.4  5.7  102 Pop fossa - Fib head 9 49 ?44 18.5         Pop fossa -  Ankle      L Tibial - AH     Ankle AH 3.8 ?5.8 18.4 ?4.0 100 Ankle - AH 9   37.4     Pop fossa AH 11.8  13.5  73.2 Pop fossa - Ankle 38 47 ?41 30.1             F  Wave    Nerve F Lat Ref.   ms ms  L Ulnar - ADM 25.3 ?32.0  L Tibial - AH 50.1 ?56.0         EMG full  EMG Summary Table    Spontaneous MUAP Recruitment  Muscle IA Fib PSW Fasc Other Amp Dur. Poly Pattern  L. Vastus medialis Normal None None None _______ Normal Normal Normal Normal  L. Tibialis anterior Normal None None None _______ Normal Normal Normal Normal  L. Lumbar paraspinals (low) Normal None None None _______ Normal Normal Normal Normal  L. Gastrocnemius (Medial head) Normal None None None _______ Normal Normal Normal Normal  L. Biceps femoris (long head) Normal None None None _______ Normal Normal Normal Normal  L. Gluteus maximus Normal None None None _______ Normal Normal Normal Normal  L. Gluteus medius Normal None None None _______ Normal Normal Normal Normal  L. Deltoid Normal None None None _______ Normal Normal Normal Normal  L. Triceps brachii Normal None None None _______ Normal Normal Normal Normal  L. Pronator teres Normal None None None _______ Normal Normal Normal Normal  L. First dorsal interosseous Normal None None None _______ Normal Normal Normal Normal  L. Opponens pollicis Normal None None None _______ Normal Normal Normal Normal  L. Cervical paraspinals (low) Normal None None None _______ Normal Normal Normal Normal

## 2016-11-03 NOTE — Progress Notes (Signed)
See procedure note.

## 2016-11-03 NOTE — Progress Notes (Addendum)
Full Name: Linda Reid Gender: Female MRN #: [id number] Date of Birth: 10-27-1985    Visit Date: 11/03/2016 11:21 Age: 31 Years 67 Months Old Examining Physician: Sarina Ill, MD  Referring Physician: Jaynee Eagles  History: Patient with left arm and left leg radicular symptoms.  Summary:   All muscles (as indicated in the following table) were normal.    All nerves (as indicated in the following table) were normal.      Conclusion: This is a normal emg/ncs of the left lower and left upper extremity.   Sarina Ill, M.D.  [facility] [place], [place] [address] Tel: [phone] Fax: [phone]        [facility]    Nerve / Sites Rec. Site Peak Lat Amp Segments Distance    ms V  cm  L Median - Orthodromic (Dig II, Mid palm)     Dig II Wrist 2.9 41 Dig II - Wrist 13  L Ulnar - Orthodromic, (Dig V, Mid palm)     Dig V Wrist 2.7 13 Dig V - Wrist 11         SNC    Nerve / Sites Rec. Site Peak Lat Ref.  Amp Ref. Segments Distance    ms ms V V  cm  L Sural - Ankle (Calf)     Calf Ankle 3.8 ?4.4 24 ?6 Calf - Ankle 14  L Superficial peroneal - Ankle     Lat leg Ankle 4.2 ?4.4 9 ?6 Lat leg - Ankle 14         MNC    Nerve / Sites Rec. Site Latency Ref. Amplitude Ref. Rel Amp Segments Distance Velocity Ref. Area    ms ms mV mV %  cm m/s m/s mVms  L Median - APB     Wrist APB 3.3 ?4.4 13.5 ?4.0 100 Wrist - APB 7   58.7     Upper arm APB 7.0  13.6  100 Upper arm - Wrist 22 60 ?49 58.5  L Ulnar - ADM     Wrist ADM 2.4 ?3.3 11.9 ?6.0 100 Wrist - ADM 7   36.5     B.Elbow ADM 5.4  10.9  92.3 B.Elbow - Wrist 18 62 ?49 33.6     A.Elbow ADM 6.8  10.4  95.5 A.Elbow - B.Elbow 9 62 ?49 32.8         A.Elbow - Wrist      L Peroneal - EDB     Ankle EDB 4.7 ?6.5 5.5 ?2.0 100 Ankle - EDB 9   17.7     Fib head EDB 10.6  5.6  102 Fib head - Ankle 29 49 ?44 18.2     Pop fossa EDB 12.4  5.7  102 Pop fossa - Fib head 9 49 ?44 18.5         Pop fossa -  Ankle      L Tibial - AH     Ankle AH 3.8 ?5.8 18.4 ?4.0 100 Ankle - AH 9   37.4     Pop fossa AH 11.8  13.5  73.2 Pop fossa - Ankle 38 47 ?41 30.1             F  Wave    Nerve F Lat Ref.   ms ms  L Ulnar - ADM 25.3 ?32.0  L Tibial - AH 50.1 ?56.0         EMG full  EMG Summary Table    Spontaneous MUAP Recruitment  Muscle IA Fib PSW Fasc Other Amp Dur. Poly Pattern  L. Vastus medialis Normal None None None _______ Normal Normal Normal Normal  L. Tibialis anterior Normal None None None _______ Normal Normal Normal Normal  L. Lumbar paraspinals (low) Normal None None None _______ Normal Normal Normal Normal  L. Gastrocnemius (Medial head) Normal None None None _______ Normal Normal Normal Normal  L. Biceps femoris (long head) Normal None None None _______ Normal Normal Normal Normal  L. Gluteus maximus Normal None None None _______ Normal Normal Normal Normal  L. Gluteus medius Normal None None None _______ Normal Normal Normal Normal  L. Deltoid Normal None None None _______ Normal Normal Normal Normal  L. Triceps brachii Normal None None None _______ Normal Normal Normal Normal  L. Pronator teres Normal None None None _______ Normal Normal Normal Normal  L. First dorsal interosseous Normal None None None _______ Normal Normal Normal Normal  L. Opponens pollicis Normal None None None _______ Normal Normal Normal Normal  L. Cervical paraspinals (low) Normal None None None _______ Normal Normal Normal Normal

## 2016-11-04 ENCOUNTER — Ambulatory Visit (INDEPENDENT_AMBULATORY_CARE_PROVIDER_SITE_OTHER): Payer: Medicaid Other | Admitting: Otolaryngology

## 2016-11-04 ENCOUNTER — Telehealth: Payer: Self-pay | Admitting: Neurology

## 2016-11-04 DIAGNOSIS — R070 Pain in throat: Secondary | ICD-10-CM

## 2016-11-04 DIAGNOSIS — R1312 Dysphagia, oropharyngeal phase: Secondary | ICD-10-CM

## 2016-11-04 DIAGNOSIS — D440 Neoplasm of uncertain behavior of thyroid gland: Secondary | ICD-10-CM | POA: Diagnosis not present

## 2016-11-04 NOTE — Telephone Encounter (Signed)
Hinton Dyer, I have referred patient to [facility]. Can you call and make sure she knows that she has to get her images from [facility] on disk and bring it with her to [facility]? They don;t have access to our images.  thanks

## 2016-11-08 ENCOUNTER — Ambulatory Visit: Payer: Medicaid Other | Attending: Neurology | Admitting: Rehabilitative and Restorative Service Providers"

## 2016-11-08 DIAGNOSIS — M6281 Muscle weakness (generalized): Secondary | ICD-10-CM | POA: Insufficient documentation

## 2016-11-08 DIAGNOSIS — M5440 Lumbago with sciatica, unspecified side: Secondary | ICD-10-CM | POA: Insufficient documentation

## 2016-11-08 DIAGNOSIS — M542 Cervicalgia: Secondary | ICD-10-CM | POA: Diagnosis present

## 2016-11-08 DIAGNOSIS — R293 Abnormal posture: Secondary | ICD-10-CM | POA: Diagnosis present

## 2016-11-08 DIAGNOSIS — G8929 Other chronic pain: Secondary | ICD-10-CM | POA: Insufficient documentation

## 2016-11-08 NOTE — Patient Instructions (Signed)
Pelvic Tilt: Posterior - Legs Bent (Supine)    Tighten stomach and flatten back by rolling pelvis down.  Then begin to lift hips slowly off of the floor.   Hold __5__ seconds. Relax. Repeat __10__ times per set. Do _1___ sets per session. Do __1-2__ sessions per day.  http://orth.exer.us/203   Copyright  VHI. All rights reserved.   Piriformis Stretch    Lying on back, pull right knee toward opposite shoulder. Hold __20__ seconds. Repeat __3__ times. Do __1-2__ sessions per day.  http://[email or web address].exer.us/258   Copyright  VHI. All rights reserved.   Hamstring Stretch, Seated (Strap, Two Chairs)    Sit with one leg extended onto facing chair. Loop strap over outstretched foot at ball of big toe. Lengthen spine. Hold for __20 seconds. Repeat __2__ times each leg.  Copyright  VHI. All rights reserved.   Butterfly, Supine    Lie on back, feet together. Lower knees toward floor. Hold _20__ seconds. Repeat _3__ times per session. Do 1-2___ sessions per day.  Copyright  VHI. All rights reserved.

## 2016-11-09 NOTE — Therapy (Signed)
[facility] [address] [place], [place], [address] Phone: [phone]   Fax:  [phone]  Physical Therapy Evaluation  Patient Details  Name: Linda Reid MRN: [id number] Date of Birth: [birth year] Referring Provider: Dr. Jaynee Eagles, MD  Encounter Date: 11/08/2016      PT End of Session - 11/09/16 1151    Visit Number 1   Number of Visits 8   Date for PT Re-Evaluation 02/20/17  extended date due to upcoming thyroid nodule removal   Authorization Type medicaid *will ask for authorization--falls in 3 visits/year tier.   PT Start Time 1240   PT Stop Time 1320   PT Time Calculation (min) 40 min   Activity Tolerance Patient limited by pain   Behavior During Therapy [facility] for tasks assessed/performed      Past Medical History:  Diagnosis Date  . Back pain   . Breast engorgement 07/03/2015  . Fall   . Groin pain, chronic, right 07/14/2015   Has pain right groin area since about end of August,?swollen lymph node noted today,she said had [name] after baby born to R/O DVT   . History of mastitis 07/14/2015  . Hyperthyroidism   . Mastitis 07/03/2015  . Preterm labor     Past Surgical History:  Procedure Laterality Date  . DG MYLEOGRAM LUMBAR SPINE ([place] [facility])    . TONSILLECTOMY      There were no vitals filed for this visit.       Subjective Assessment - 11/08/16 1242    Subjective The patient reports that onset of back pain began when she was 7 months pregnant.  She went to PT 2x > 1 year ago.  She underwent spinal procedure and she had CSF leak with 3 blood patches.  Her neck pain began after the lumbar puncture (on 03/03/2016).   She feels she is getting worse with back pain noting increased symptoms recently on the L side as well (was worse on the R in the past).     She fell last month and notes some weakness in legs.   In addition to lumbar/leg pain, she also has neck pain that is worsening with L hand  weakness noted.     Pertinent History patient undergoing work-up to have R thyroid removed due to nodule.    How long can you sit comfortably? Sitting up for > 30 minutes increases neck pain to "severe"   How long can you stand comfortably? up to 30 minutes   Patient Stated Goals Reduce pain.   Currently in Pain? Yes  constant   Pain Score 7   can increase   Pain Location Back   Pain Orientation Right  piriformis + left side in thoracic and lumbar region   Pain Descriptors / Indicators Spasm;Aching  L=spasms + sharp pain; R= achy    Pain Type Acute pain;Chronic pain  more acute on the left side   Pain Onset More than a month ago   Pain Frequency Constant   Aggravating Factors  standing and walking   Pain Relieving Factors rest   Multiple Pain Sites Yes   Pain Score 7   Pain Location Neck  feels like "head fullness"   Pain Orientation Posterior   Pain Descriptors / Indicators --  pulling sensation through shoulders   Pain Type Chronic pain   Pain Onset More than a month ago   Pain Frequency Constant   Aggravating Factors  difficulty sleeping;    Pain Relieving  Factors changing positions            [facility] PT Assessment - 11/08/16 1256      Assessment   Medical Diagnosis Cervical radiculopathy, lumbar radiculopathy, CSF leak.   Referring Provider Dr. Jaynee Eagles, MD   Onset Date/Surgical Date --  fall 2016     Precautions   Precautions Fall     Restrictions   Weight Bearing Restrictions No     Balance Screen   Has the patient fallen in the past 6 months Yes   How many times? 1   Has the patient had a decrease in activity level because of a fear of falling?  No   Is the patient reluctant to leave their home because of a fear of falling?  No     Home Ecologist residence     Prior Function   Level of Independence Independent     Observation/Other Assessments   Focus on Therapeutic Outcomes (FOTO)  47%   Other Surveys  Other Surveys    Neck Disability Index  52%     Posture/Postural Control   Posture/Postural Control Postural limitations   Posture Comments Reduced spinal curvature noted in thoracic and cervical spine.     AROM   Overall AROM  Within functional limits for tasks performed;Deficits  ROM provokes pain, especially R end range, normal range   Overall AROM Comments NECK:  patient has pain worse on the right with right rotation.  Neck flexion provokes neck pain right muscles radiating from suboccipitals to scapula.  ROM all WNLs.  LUMBAR:   Lumbar AROM provokes pain for flexion right side and into SI joint.  Extension aggravates R pain at R si joint.  rotation equal bilaterally.  Pain limits full ROM.      Strength   Overall Strength Deficits  R shoulder flex/abduction 4/5, other UE 5/5.    Right/Left Hip Right;Left   Right Hip Flexion 2+/5   Left Hip Flexion 5/5   Right/Left Knee Right;Left   Right Knee Flexion 3/5   Right Knee Extension 3/5  with pain to extend   Left Knee Flexion 4+/5   Left Knee Extension 4+/5   Right/Left Ankle Right;Left   Right Ankle Dorsiflexion 3/5   Left Ankle Dorsiflexion 4/5     Flexibility   Soft Tissue Assessment /Muscle Length --  tightness noted hip adductor, hamstring tightness.      Palpation   Palpation comment Muscle tenderness to palpation in R suboccipitals, scalenes, and R upper trap.  R SI joint tender to palpation, as well as erector spinae muscles and R quadratus lumborum.       Ambulation/Gait   Ambulation/Gait Yes   Gait Comments slowed gait speed overall, ambulates independently on community surfaces.                            PT Education - 11/09/16 1151    Education provided Yes   Education Details HEP: posterior pelvic tilt, hamstring stretch, adductor stretch, piriformis stretch   Person(s) Educated Patient   Methods Explanation;Demonstration;Handout   Comprehension Verbalized understanding;Returned demonstration           PT Short Term Goals - 11/09/16 1219      PT SHORT TERM GOAL #1   Title The patient will be indep with HEP for spinal stabilization, flexibility, and general conditioning.   Baseline Not performing regular HEP:  Target date 12/09/16  Time 4   Period Weeks     PT SHORT TERM GOAL #2   Title The patient will improve R hip flexion to 4/5 for improved functional strength to be able to lift against gravity.   Baseline 2+/5 R hip flexion:  Target date 12/09/16   Time 4   Period Weeks     PT SHORT TERM GOAL #3   Title Patient will report neck pain < or equal to 5/10.   Baseline 7/10 at rest:  Target date 12/09/16   Time 4   Period Weeks     PT SHORT TERM GOAL #4   Title The patient will report lumbar pain < or equal to 5/10.   Baseline 7/10 at rest:  Target date 12/09/16.   Time 4   Period Weeks           PT Long Term Goals - 11/09/16 1221      PT LONG TERM GOAL #1   Title The patient will improve neck disability index from 52% to < or equal to 40% to demo improving self perception of disability.   Baseline 52% baseline: Target date 02/20/2017   Time 8   Period Weeks     PT LONG TERM GOAL #2   Title The patient will be independent with community exercise routine.   Baseline Not able to tolerate community exercise: target date 02/20/2017   Time 8   Period Weeks     PT LONG TERM GOAL #3   Title The patient will be able to walk for > or equal to 45 minutes nonstop with increase in pain < 3/10 from baseline.   Baseline Walks 30 minutes with pain "severe"   Time 8   Period Weeks               Plan - 11/09/16 1213    Clinical Impression Statement The patient is a 31 year old female presenting to OP PT with peripheral nervous system disorders of cervical radiculopathy and lumbar radiculopathy. PT diagnoses are muscle weakness, postural abnormality, neck pain and low back pain.   She has pain limiting function in R low back and SI region, R neck radiating from suboccipitals  to superior shoulder blade.  The patient also presents with weakness noted in both LEs with R more affected.  PT to address deficits to optimize functional mobility.   *Scheduled end date for July as patient is scheduled to undergo surgical procedure due to enlarged R thyroid/removal of nodule.  Would like to schedule 1 visit after surgery to assess neck symptoms post surgery.*   Rehab Potential Good   Clinical Impairments Affecting Rehab Potential m-caid limited to 1 eval + 3 visits/year for musculoskeletal disorders.   PT Frequency 5x / week   PT Treatment/Interventions ADLs/Self Care Home Management;Gait training;Stair training;Functional mobility training;Therapeutic activities;Therapeutic exercise;Balance training;Neuromuscular re-education;Patient/family education;Manual techniques;Energy conservation   PT Next Visit Plan Check initial HEP.  Provide spinal stabilization activities.  Check neck mobility.  *feel that thyroid nodule may be impacting pain--may schedule after removal.   Consulted and Agree with Plan of Care Patient      Patient will benefit from skilled therapeutic intervention in order to improve the following deficits and impairments:  Abnormal gait, Impaired sensation, Decreased activity tolerance, Decreased strength, Pain, Decreased balance, Increased muscle spasms, Decreased coordination, Impaired flexibility, Postural dysfunction  Visit Diagnosis: No diagnosis found.     Problem List Patient Active Problem List   Diagnosis Date Noted  . Nexplanon  insertion 08/26/2015  . History of preterm delivery 08/26/2015  . History of chlamydia 08/26/2015  . Postpartum/situational depression 08/06/2015  . History of mastitis 07/14/2015  . Groin pain, chronic, right 07/14/2015    Kensie Susman, PT 11/09/2016, 12:24 PM  [facility] [address] [place], [place], [address] Phone: [phone]   Fax:   [phone]  Name: Linda Reid MRN: [id number] Date of Birth: 1986-01-15

## 2016-11-10 ENCOUNTER — Ambulatory Visit (INDEPENDENT_AMBULATORY_CARE_PROVIDER_SITE_OTHER): Payer: Medicaid Other | Admitting: Psychology

## 2016-11-10 DIAGNOSIS — F321 Major depressive disorder, single episode, moderate: Secondary | ICD-10-CM

## 2016-11-11 ENCOUNTER — Encounter: Payer: Medicaid Other | Admitting: Physical Medicine & Rehabilitation

## 2016-11-12 ENCOUNTER — Other Ambulatory Visit: Payer: Self-pay | Admitting: Otolaryngology

## 2016-11-12 NOTE — Telephone Encounter (Signed)
Left Patient a detailed message about Picking up her MRI disc location and telephone number.

## 2016-11-17 ENCOUNTER — Ambulatory Visit (INDEPENDENT_AMBULATORY_CARE_PROVIDER_SITE_OTHER): Payer: Medicaid Other | Admitting: Psychology

## 2016-11-17 ENCOUNTER — Ambulatory Visit: Payer: Self-pay | Admitting: Neurology

## 2016-11-17 DIAGNOSIS — F321 Major depressive disorder, single episode, moderate: Secondary | ICD-10-CM

## 2016-11-18 ENCOUNTER — Other Ambulatory Visit: Payer: Self-pay | Admitting: Physical Medicine & Rehabilitation

## 2016-11-18 ENCOUNTER — Telehealth: Payer: Self-pay | Admitting: Neurology

## 2016-11-18 NOTE — Telephone Encounter (Signed)
Linda Reid from [facility] emailed me and informed me that medicaid did not approved the MRI.Marland Kitchen The case number is [id number] and the phone number for the peer to peer is [phone]. She is scheduled to have the exam done on Saturday 11/20/16 at 10:00 AM.

## 2016-11-19 NOTE — Telephone Encounter (Signed)
I referred to Linda Reid at [facility] but she has not been called. Hinton Dyer can you follow up?  [name], medicaid is reviewing the case and we will have an answer by COB Monday. I have informed patient to reschedule her imaging tomorrow to later next week.   Delsa Sale - just fyi in case patient calls next week. thanks  thanks

## 2016-11-20 ENCOUNTER — Other Ambulatory Visit: Payer: Self-pay

## 2016-11-21 DIAGNOSIS — E041 Nontoxic single thyroid nodule: Secondary | ICD-10-CM

## 2016-11-21 HISTORY — DX: Nontoxic single thyroid nodule: E04.1

## 2016-11-22 NOTE — Telephone Encounter (Signed)
[facility] # [id number]

## 2016-11-22 NOTE — Telephone Encounter (Signed)
Hi, Dr. Jaynee Eagles . Called and spoke to Amy at [facility] . Patient can't be scheduled until she has MRI's Done. We are still waiting on insurance for this.   Amy at [facility] she needs images CD mailed to her at  [facility]. Department of Radiology , [place] [address] [place], [place] [address] [place] , [place] [address] .   I have spoke to Patient with all details.

## 2016-11-23 NOTE — Telephone Encounter (Signed)
I called Medicaid and they gave me the authorization number [id number] (exp. 12/16/16)

## 2016-11-23 NOTE — Telephone Encounter (Signed)
Dr. Steve Rattler has approval For MRI Cervical Spine

## 2016-11-26 ENCOUNTER — Telehealth: Payer: Self-pay | Admitting: Rehabilitative and Restorative Service Providers"

## 2016-11-26 ENCOUNTER — Ambulatory Visit: Payer: Medicaid Other | Attending: Neurology | Admitting: Rehabilitative and Restorative Service Providers"

## 2016-11-26 NOTE — Telephone Encounter (Signed)
Attempted to contact the patient due to not showing for PT visit today.  Mailbox full, no message left. Ryin Schillo, PT

## 2016-11-29 NOTE — Telephone Encounter (Signed)
She is scheduled to have these images done at [facility] on Friday 12/10/16.

## 2016-11-30 ENCOUNTER — Ambulatory Visit: Payer: Medicaid Other | Admitting: Psychology

## 2016-12-06 ENCOUNTER — Encounter (HOSPITAL_BASED_OUTPATIENT_CLINIC_OR_DEPARTMENT_OTHER): Payer: Self-pay | Admitting: *Deleted

## 2016-12-06 DIAGNOSIS — R0981 Nasal congestion: Secondary | ICD-10-CM

## 2016-12-06 HISTORY — DX: Nasal congestion: R09.81

## 2016-12-06 NOTE — Pre-Procedure Instructions (Signed)
History of CSF leak and tracheal deviation discussed with Dr. Jenita Seashore; pt. needs a CXR prior to surgery.  Pt. notified; will go to [facility]. Imaging at [facility] this week for CXR.

## 2016-12-07 ENCOUNTER — Ambulatory Visit (INDEPENDENT_AMBULATORY_CARE_PROVIDER_SITE_OTHER): Payer: Medicaid Other | Admitting: Sports Medicine

## 2016-12-07 ENCOUNTER — Ambulatory Visit (INDEPENDENT_AMBULATORY_CARE_PROVIDER_SITE_OTHER): Payer: Medicaid Other

## 2016-12-07 DIAGNOSIS — M2142 Flat foot [pes planus] (acquired), left foot: Secondary | ICD-10-CM | POA: Diagnosis not present

## 2016-12-07 DIAGNOSIS — M79671 Pain in right foot: Secondary | ICD-10-CM

## 2016-12-07 DIAGNOSIS — M2141 Flat foot [pes planus] (acquired), right foot: Secondary | ICD-10-CM | POA: Diagnosis not present

## 2016-12-07 DIAGNOSIS — M7750 Other enthesopathy of unspecified foot: Secondary | ICD-10-CM

## 2016-12-07 DIAGNOSIS — M792 Neuralgia and neuritis, unspecified: Secondary | ICD-10-CM

## 2016-12-07 DIAGNOSIS — M779 Enthesopathy, unspecified: Secondary | ICD-10-CM

## 2016-12-07 MED ORDER — TRIAMCINOLONE ACETONIDE 10 MG/ML IJ SUSP: 10.0000 mg | Freq: Once | INTRAMUSCULAR | Status: AC

## 2016-12-07 NOTE — Patient Instructions (Signed)
[place] Healthy Feet for callus  Capsiacin cream or Aspercreme with lidocaine for numbness, tingling or sharp pain

## 2016-12-07 NOTE — Progress Notes (Signed)
Subjective: Linda Reid is a 31 y.o. female patient who presents to office for evaluation of Right foot pain. Patient complains of progressive pain especially over the last year in the right foot at the side as tingling, numbness and burning. Reports a history of back issues and is seeing a pain mgt and neurologist. Admits to old history of ankle sprain but nothing recent. Patient denies any other pedal complaints. Denies recent injury/trip/fall/sprain/any causative factors.   Patient Active Problem List   Diagnosis Date Noted  . Nexplanon insertion 08/26/2015  . History of preterm delivery 08/26/2015  . History of chlamydia 08/26/2015  . Postpartum/situational depression 08/06/2015  . History of mastitis 07/14/2015  . Groin pain, chronic, right 07/14/2015    Current Outpatient Prescriptions on File Prior to Visit  Medication Sig Dispense Refill  . aspirin-acetaminophen-caffeine (EXCEDRIN MIGRAINE) 250-250-65 MG tablet Take 1-2 tablets by mouth daily as needed for headache or migraine.    . baclofen (LIORESAL) 10 MG tablet Take 10 mg by mouth 3 (three) times daily as needed for muscle spasms.    . DULoxetine (CYMBALTA) 30 MG capsule Take 1 capsule (30 mg total) by mouth daily. 30 capsule 1  . etonogestrel (NEXPLANON) 68 MG IMPL implant 1 each by Subdermal route once.    . gabapentin (NEURONTIN) 100 MG capsule Take 100 mg by mouth 3 (three) times daily.     . methimazole (TAPAZOLE) 5 MG tablet Take 5 mg by mouth daily.    . Rotigotine (NEUPRO) 1 MG/24HR PT24 Place 1 patch (1 mg total) onto the skin at bedtime. 30 patch 6  . traMADol (ULTRAM) 50 MG tablet Take 1 tablet (50 mg total) by mouth 2 (two) times daily as needed. 60 tablet 0   No current facility-administered medications on file prior to visit.     No Known Allergies  Objective:  General: Alert and oriented x3 in no acute distress  Dermatology: No open lesions bilateral lower extremities, no webspace macerations, no  ecchymosis bilateral, all nails x 10 are well manicured.  Vascular: Dorsalis Pedis and Posterior Tibial pedal pulses palpable, Capillary Fill Time 3 seconds,(+) pedal hair growth bilateral, no edema bilateral lower extremities, Temperature gradient within normal limits.  Neurology: Gross sensation intact via light touch bilateral, Protective sensation slightly diminished on right with Thornell Mule Monofilament to all pedal sites, Position sense intact, vibratory intact bilateral, Deep tendon reflexes within normal limits bilateral, No babinski sign present bilateral. (- )Tinels sign bilateral. Subjective numbness and tingling on right foot and toes.  Musculoskeletal: Mild tenderness with palpation at Peroneus brevis insertion and along PT tendon on right,No pain with calf compression bilateral. There is decreased ankle rom with knee extending  vs flexed resembling gastroc equnius bilateral, Subtalar joint range of motion is within normal limits, there is no 1st ray hypermobility noted bilateral, decreased 1st MPJ rom Right>Left with functional limitus noted on weightbearing exam and pes planus. Strength within normal limits in all groups bilateral.   Gait: Antalgic gait  Xrays  Right Foot   Impression:No acute findings   Assessment and Plan: Problem List Items Addressed This Visit    None    Visit Diagnoses    Right foot pain    -  Primary   Relevant Orders   DG Foot 2 Views Right   Neuritis       Relevant Medications   triamcinolone acetonide (KENALOG) 10 MG/ML injection 10 mg   Capsulitis       Relevant Medications  triamcinolone acetonide (KENALOG) 10 MG/ML injection 10 mg   Pes planus of both feet           -Complete examination performed -Xrays reviewed -Discussed treatement options for neuritis secondary to back and foot pain secondary to foot type -After oral consent and aseptic prep, injected a mixture containing 1 ml of 2%  plain lidocaine, 1 ml 0.5% plain marcaine,  0.5 ml of kenalog 10 and 0.5 ml of dexamethasone phosphate into right 5th met base at area of most pain without complication. Post-injection care discussed with patient.  -Recommend OTC capsician cream or aspercreme for pain -Recommend repeat NCV/EMG with current neurologist patient to discuss this with her doctor -Recommend continue with follow up for back issues -Dispensed heel lifts to use bilateal  -Patient to return to office in 1 month or sooner if condition worsens. If no better will order MRI for further eval of right foot pain.   Landis Martins, DPM

## 2016-12-08 ENCOUNTER — Ambulatory Visit (INDEPENDENT_AMBULATORY_CARE_PROVIDER_SITE_OTHER): Payer: Medicaid Other | Admitting: Psychology

## 2016-12-08 DIAGNOSIS — F321 Major depressive disorder, single episode, moderate: Secondary | ICD-10-CM

## 2016-12-09 ENCOUNTER — Ambulatory Visit
Admission: RE | Admit: 2016-12-09 | Discharge: 2016-12-09 | Disposition: A | Discharge status: Home / Self Care | Payer: Medicaid Other | Source: Ambulatory Visit | Attending: Otolaryngology | Admitting: Otolaryngology

## 2016-12-09 ENCOUNTER — Other Ambulatory Visit (INDEPENDENT_AMBULATORY_CARE_PROVIDER_SITE_OTHER): Payer: Self-pay | Admitting: Otolaryngology

## 2016-12-09 DIAGNOSIS — Z01818 Encounter for other preprocedural examination: Secondary | ICD-10-CM

## 2016-12-10 ENCOUNTER — Ambulatory Visit
Admission: RE | Admit: 2016-12-10 | Discharge: 2016-12-10 | Disposition: A | Discharge status: Home / Self Care | Payer: Medicaid Other | Source: Ambulatory Visit | Attending: Physical Medicine & Rehabilitation | Admitting: Physical Medicine & Rehabilitation

## 2016-12-10 ENCOUNTER — Ambulatory Visit
Admission: RE | Admit: 2016-12-10 | Discharge: 2016-12-10 | Disposition: A | Discharge status: Home / Self Care | Payer: Medicaid Other | Source: Ambulatory Visit | Attending: Neurology | Admitting: Neurology

## 2016-12-10 DIAGNOSIS — M5412 Radiculopathy, cervical region: Secondary | ICD-10-CM

## 2016-12-10 DIAGNOSIS — R200 Anesthesia of skin: Secondary | ICD-10-CM

## 2016-12-10 DIAGNOSIS — R202 Paresthesia of skin: Secondary | ICD-10-CM

## 2016-12-10 DIAGNOSIS — G8929 Other chronic pain: Secondary | ICD-10-CM

## 2016-12-10 DIAGNOSIS — R29898 Other symptoms and signs involving the musculoskeletal system: Secondary | ICD-10-CM

## 2016-12-10 DIAGNOSIS — M5441 Lumbago with sciatica, right side: Principal | ICD-10-CM

## 2016-12-10 MED ORDER — GADOBENATE DIMEGLUMINE 529 MG/ML IV SOLN
15.0000 mL | Freq: Once | INTRAVENOUS | Status: AC | PRN
  Administered 2016-12-10: 15 mL via INTRAVENOUS

## 2016-12-13 ENCOUNTER — Encounter (HOSPITAL_BASED_OUTPATIENT_CLINIC_OR_DEPARTMENT_OTHER)
Admission: RE | Disposition: A | Discharge status: Home / Self Care | Payer: Self-pay | Source: Ambulatory Visit | Attending: Otolaryngology

## 2016-12-13 ENCOUNTER — Encounter (HOSPITAL_BASED_OUTPATIENT_CLINIC_OR_DEPARTMENT_OTHER): Payer: Self-pay | Admitting: *Deleted

## 2016-12-13 ENCOUNTER — Ambulatory Visit (HOSPITAL_BASED_OUTPATIENT_CLINIC_OR_DEPARTMENT_OTHER): Payer: Medicaid Other | Admitting: Anesthesiology

## 2016-12-13 ENCOUNTER — Telehealth: Payer: Self-pay | Admitting: *Deleted

## 2016-12-13 ENCOUNTER — Ambulatory Visit (HOSPITAL_BASED_OUTPATIENT_CLINIC_OR_DEPARTMENT_OTHER)
Admission: RE | Admit: 2016-12-13 | Discharge: 2016-12-14 | Disposition: A | Discharge status: Home / Self Care | Payer: Medicaid Other | Source: Ambulatory Visit | Attending: Otolaryngology | Admitting: Otolaryngology

## 2016-12-13 DIAGNOSIS — E079 Disorder of thyroid, unspecified: Secondary | ICD-10-CM | POA: Diagnosis present

## 2016-12-13 DIAGNOSIS — Z9889 Other specified postprocedural states: Secondary | ICD-10-CM

## 2016-12-13 DIAGNOSIS — E890 Postprocedural hypothyroidism: Secondary | ICD-10-CM

## 2016-12-13 DIAGNOSIS — E049 Nontoxic goiter, unspecified: Secondary | ICD-10-CM | POA: Diagnosis not present

## 2016-12-13 DIAGNOSIS — D440 Neoplasm of uncertain behavior of thyroid gland: Secondary | ICD-10-CM | POA: Diagnosis not present

## 2016-12-13 DIAGNOSIS — J398 Other specified diseases of upper respiratory tract: Secondary | ICD-10-CM

## 2016-12-13 HISTORY — DX: Cerebrospinal fluid leak from spinal puncture: G97.0

## 2016-12-13 HISTORY — DX: Restless legs syndrome: G25.81

## 2016-12-13 HISTORY — DX: Nontoxic single thyroid nodule: E04.1

## 2016-12-13 HISTORY — DX: Frequency of micturition: R35.0

## 2016-12-13 HISTORY — DX: Dysphagia, unspecified: R13.10

## 2016-12-13 HISTORY — DX: Other specified diseases of upper respiratory tract: J39.8

## 2016-12-13 HISTORY — DX: Migraine, unspecified, not intractable, without status migrainosus: G43.909

## 2016-12-13 HISTORY — DX: Unspecified osteoarthritis, unspecified site: M19.90

## 2016-12-13 HISTORY — PX: THYROIDECTOMY: SHX17

## 2016-12-13 HISTORY — DX: Other chronic pain: G89.29

## 2016-12-13 HISTORY — DX: Nasal congestion: R09.81

## 2016-12-13 HISTORY — DX: Cervicalgia: M54.2

## 2016-12-13 HISTORY — DX: Polyneuropathy, unspecified: G62.9

## 2016-12-13 LAB — POCT HEMOGLOBIN-HEMACUE: HEMOGLOBIN: 14.6 g/dL (ref 12.0–15.0)

## 2016-12-13 SURGERY — THYROIDECTOMY
Anesthesia: General | Laterality: Right

## 2016-12-13 MED ORDER — DULOXETINE HCL 30 MG PO CPEP: 30.0000 mg | ORAL_CAPSULE | Freq: Every day | ORAL | Status: DC

## 2016-12-13 MED ORDER — GABAPENTIN 100 MG PO CAPS
ORAL_CAPSULE | ORAL | Status: AC
Start: 2016-12-13 — End: 2016-12-13
  Filled 2016-12-13: qty 1

## 2016-12-13 MED ORDER — SUCCINYLCHOLINE CHLORIDE 20 MG/ML IJ SOLN
INTRAMUSCULAR | Status: DC | PRN
  Administered 2016-12-13: 80 mg via INTRAVENOUS

## 2016-12-13 MED ORDER — METHIMAZOLE 5 MG PO TABS: 5.0000 mg | ORAL_TABLET | Freq: Every day | ORAL | Status: DC

## 2016-12-13 MED ORDER — SCOPOLAMINE 1 MG/3DAYS TD PT72: 1.0000 | MEDICATED_PATCH | Freq: Once | TRANSDERMAL | Status: DC | PRN

## 2016-12-13 MED ORDER — OXYCODONE-ACETAMINOPHEN 5-325 MG PO TABS: 1.0000 | ORAL_TABLET | ORAL | 0 refills | Status: DC | PRN

## 2016-12-13 MED ORDER — GABAPENTIN 100 MG PO CAPS
ORAL_CAPSULE | ORAL | Status: AC
  Filled 2016-12-13: qty 1

## 2016-12-13 MED ORDER — MIDAZOLAM HCL 2 MG/2ML IJ SOLN
INTRAMUSCULAR | Status: AC
  Filled 2016-12-13: qty 2

## 2016-12-13 MED ORDER — DEXAMETHASONE SODIUM PHOSPHATE 4 MG/ML IJ SOLN
INTRAMUSCULAR | Status: DC | PRN
  Administered 2016-12-13: 10 mg via INTRAVENOUS

## 2016-12-13 MED ORDER — HYDROMORPHONE HCL 1 MG/ML IJ SOLN
INTRAMUSCULAR | Status: AC
  Filled 2016-12-13: qty 1

## 2016-12-13 MED ORDER — PROMETHAZINE HCL 25 MG/ML IJ SOLN: 6.2500 mg | INTRAMUSCULAR | Status: DC | PRN

## 2016-12-13 MED ORDER — OXYCODONE-ACETAMINOPHEN 5-325 MG PO TABS
1.0000 | ORAL_TABLET | ORAL | Status: DC | PRN
  Administered 2016-12-13 (×2): 2 via ORAL
  Filled 2016-12-13 (×2): qty 2

## 2016-12-13 MED ORDER — FENTANYL CITRATE (PF) 100 MCG/2ML IJ SOLN
INTRAMUSCULAR | Status: AC
  Filled 2016-12-13: qty 2

## 2016-12-13 MED ORDER — GABAPENTIN 300 MG PO CAPS
ORAL_CAPSULE | ORAL | Status: AC
  Filled 2016-12-13: qty 1

## 2016-12-13 MED ORDER — OXYCODONE HCL 5 MG/5ML PO SOLN: 5.0000 mg | Freq: Once | ORAL | Status: DC | PRN

## 2016-12-13 MED ORDER — KETOROLAC TROMETHAMINE 30 MG/ML IJ SOLN: 30.0000 mg | Freq: Once | INTRAMUSCULAR | Status: DC | PRN

## 2016-12-13 MED ORDER — OXYCODONE HCL 5 MG PO TABS: 5.0000 mg | ORAL_TABLET | Freq: Once | ORAL | Status: DC | PRN

## 2016-12-13 MED ORDER — KCL IN DEXTROSE-NACL 20-5-0.45 MEQ/L-%-% IV SOLN
INTRAVENOUS | Status: DC
  Administered 2016-12-13 (×2): via INTRAVENOUS
  Filled 2016-12-13 (×2): qty 1000

## 2016-12-13 MED ORDER — ONDANSETRON HCL 4 MG PO TABS: 4.0000 mg | ORAL_TABLET | ORAL | Status: DC | PRN

## 2016-12-13 MED ORDER — LACTATED RINGERS IV SOLN
INTRAVENOUS | Status: DC
  Administered 2016-12-13: 08:00:00 via INTRAVENOUS

## 2016-12-13 MED ORDER — CEFAZOLIN SODIUM-DEXTROSE 2-4 GM/100ML-% IV SOLN
INTRAVENOUS | Status: AC
  Filled 2016-12-13: qty 100

## 2016-12-13 MED ORDER — MIDAZOLAM HCL 2 MG/2ML IJ SOLN
1.0000 mg | INTRAMUSCULAR | Status: DC | PRN
  Administered 2016-12-13: 2 mg via INTRAVENOUS

## 2016-12-13 MED ORDER — AMOXICILLIN 875 MG PO TABS: 875.0000 mg | ORAL_TABLET | Freq: Two times a day (BID) | ORAL | 0 refills | Status: AC

## 2016-12-13 MED ORDER — FENTANYL CITRATE (PF) 100 MCG/2ML IJ SOLN
50.0000 ug | INTRAMUSCULAR | Status: DC | PRN
  Administered 2016-12-13: 50 ug via INTRAVENOUS
  Administered 2016-12-13: 100 ug via INTRAVENOUS

## 2016-12-13 MED ORDER — CEFAZOLIN SODIUM-DEXTROSE 2-3 GM-% IV SOLR
INTRAVENOUS | Status: DC | PRN
  Administered 2016-12-13: 2 g via INTRAVENOUS

## 2016-12-13 MED ORDER — ONDANSETRON HCL 4 MG/2ML IJ SOLN
4.0000 mg | INTRAMUSCULAR | Status: DC | PRN
  Administered 2016-12-13 (×2): 4 mg via INTRAVENOUS
  Filled 2016-12-13 (×2): qty 2

## 2016-12-13 MED ORDER — MEPERIDINE HCL 25 MG/ML IJ SOLN: 6.2500 mg | INTRAMUSCULAR | Status: DC | PRN

## 2016-12-13 MED ORDER — HYDROMORPHONE HCL 1 MG/ML IJ SOLN
0.2500 mg | INTRAMUSCULAR | Status: DC | PRN
  Administered 2016-12-13 (×3): 0.5 mg via INTRAVENOUS

## 2016-12-13 MED ORDER — PROPOFOL 10 MG/ML IV BOLUS
INTRAVENOUS | Status: DC | PRN
Start: 2016-12-13 — End: 2016-12-13
  Administered 2016-12-13: 100 mg via INTRAVENOUS

## 2016-12-13 MED ORDER — LIDOCAINE-EPINEPHRINE 1 %-1:100000 IJ SOLN
INTRAMUSCULAR | Status: DC | PRN
  Administered 2016-12-13: 2.5 mL

## 2016-12-13 MED ORDER — MORPHINE SULFATE (PF) 2 MG/ML IV SOLN: 2.0000 mg | INTRAVENOUS | Status: DC | PRN

## 2016-12-13 MED ORDER — LIDOCAINE HCL (CARDIAC) 20 MG/ML IV SOLN
INTRAVENOUS | Status: DC | PRN
  Administered 2016-12-13: 100 mg via INTRAVENOUS

## 2016-12-13 MED ORDER — GABAPENTIN 100 MG PO CAPS
100.0000 mg | ORAL_CAPSULE | Freq: Three times a day (TID) | ORAL | Status: DC
  Administered 2016-12-13 (×2): 100 mg via ORAL

## 2016-12-13 SURGICAL SUPPLY — 66 items
ADH SKN CLS APL DERMABOND .7 (GAUZE/BANDAGES/DRESSINGS) ×1
ATTRACTOMAT 16X20 MAGNETIC DRP (DRAPES) IMPLANT
BLADE CLIPPER SURG (BLADE) IMPLANT
BLADE SURG 10 STRL SS (BLADE) IMPLANT
BLADE SURG 15 STRL LF DISP TIS (BLADE) ×1 IMPLANT
BLADE SURG 15 STRL SS (BLADE) ×2
CANISTER SUCT 1200ML W/VALVE (MISCELLANEOUS) ×2 IMPLANT
CLIP TI WIDE RED SMALL 6 (CLIP) IMPLANT
CORDS BIPOLAR (ELECTRODE) ×2 IMPLANT
COVER BACK TABLE 60X90IN (DRAPES) ×2 IMPLANT
COVER MAYO STAND STRL (DRAPES) ×2 IMPLANT
DECANTER SPIKE VIAL GLASS SM (MISCELLANEOUS) ×2 IMPLANT
DERMABOND ADVANCED (GAUZE/BANDAGES/DRESSINGS) ×1
DERMABOND ADVANCED .7 DNX12 (GAUZE/BANDAGES/DRESSINGS) ×1 IMPLANT
DRAIN CHANNEL 10F 3/8 F FF (DRAIN) ×1 IMPLANT
DRAIN CHANNEL 7F FF FLAT (WOUND CARE) IMPLANT
DRAPE U-SHAPE 76X120 STRL (DRAPES) ×2 IMPLANT
ELECT COATED BLADE 2.86 ST (ELECTRODE) ×2 IMPLANT
ELECT PAIRED SUBDERMAL (MISCELLANEOUS) ×2
ELECT REM PT RETURN 9FT ADLT (ELECTROSURGICAL) ×2
ELECTRODE PAIRED SUBDERMAL (MISCELLANEOUS) IMPLANT
ELECTRODE REM PT RTRN 9FT ADLT (ELECTROSURGICAL) ×1 IMPLANT
EVACUATOR SILICONE 100CC (DRAIN) ×1 IMPLANT
FORCEPS BIPOLAR SPETZLER 8 1.0 (NEUROSURGERY SUPPLIES) ×2 IMPLANT
GAUZE SPONGE 4X4 12PLY STRL LF (GAUZE/BANDAGES/DRESSINGS) IMPLANT
GAUZE SPONGE 4X4 16PLY XRAY LF (GAUZE/BANDAGES/DRESSINGS) ×2 IMPLANT
GLOVE BIO SURGEON STRL SZ 6.5 (GLOVE) ×2 IMPLANT
GLOVE BIO SURGEON STRL SZ7 (GLOVE) ×1 IMPLANT
GLOVE BIO SURGEON STRL SZ7.5 (GLOVE) ×2 IMPLANT
GLOVE BIOGEL PI IND STRL 7.0 (GLOVE) IMPLANT
GLOVE BIOGEL PI IND STRL 7.5 (GLOVE) IMPLANT
GLOVE BIOGEL PI INDICATOR 7.0 (GLOVE) ×3
GLOVE BIOGEL PI INDICATOR 7.5 (GLOVE) ×1
GLOVE ECLIPSE 6.5 STRL STRAW (GLOVE) ×1 IMPLANT
GOWN STRL REUS W/ TWL LRG LVL3 (GOWN DISPOSABLE) ×2 IMPLANT
GOWN STRL REUS W/TWL LRG LVL3 (GOWN DISPOSABLE) ×6
HEMOSTAT SURGICEL 2X14 (HEMOSTASIS) IMPLANT
NDL HYPO 25X1 1.5 SAFETY (NEEDLE) ×1 IMPLANT
NEEDLE HYPO 25X1 1.5 SAFETY (NEEDLE) ×2 IMPLANT
NS IRRIG 1000ML POUR BTL (IV SOLUTION) ×2 IMPLANT
PACK BASIN DAY SURGERY FS (CUSTOM PROCEDURE TRAY) ×2 IMPLANT
PENCIL BUTTON HOLSTER BLD 10FT (ELECTRODE) ×2 IMPLANT
PIN SAFETY STERILE (MISCELLANEOUS) ×1 IMPLANT
PROBE NERVBE PRASS .33 (MISCELLANEOUS) ×1 IMPLANT
SHEARS HARMONIC 9CM CVD (BLADE) ×2 IMPLANT
SLEEVE SCD COMPRESS KNEE MED (MISCELLANEOUS) ×1 IMPLANT
SPONGE INTESTINAL PEANUT (DISPOSABLE) ×2 IMPLANT
STAPLER VISISTAT 35W (STAPLE) IMPLANT
SUT ETHILON 3 0 PS 1 (SUTURE) ×2 IMPLANT
SUT PROLENE 5 0 P 3 (SUTURE) IMPLANT
SUT SILK 2 0 SH (SUTURE) ×2 IMPLANT
SUT SILK 2 0 TIES 17X18 (SUTURE)
SUT SILK 2-0 18XBRD TIE BLK (SUTURE) IMPLANT
SUT SILK 3 0 TIES 17X18 (SUTURE) ×4
SUT SILK 3-0 18XBRD TIE BLK (SUTURE) ×2 IMPLANT
SUT VIC AB 3-0 FS2 27 (SUTURE) ×2 IMPLANT
SUT VICRYL 4-0 PS2 18IN ABS (SUTURE) ×2 IMPLANT
SYR BULB 3OZ (MISCELLANEOUS) ×2 IMPLANT
SYR CONTROL 10ML LL (SYRINGE) ×2 IMPLANT
TOWEL OR 17X24 6PK STRL BLUE (TOWEL DISPOSABLE) ×4 IMPLANT
TRAY DSU PREP LF (CUSTOM PROCEDURE TRAY) ×2 IMPLANT
TUBE CONNECTING 20X1/4 (TUBING) ×2 IMPLANT
TUBE ENDOTRAC NIMS EMG 6MM (MISCELLANEOUS) IMPLANT
TUBE ENDOTRAC NIMS EMG 7MM (MISCELLANEOUS) ×1 IMPLANT
TUBE ENDOTRAC NIMS EMG 8MM (MISCELLANEOUS) IMPLANT
TUBE ENDOTRAC NIMS EMG 9MM (MISCELLANEOUS) IMPLANT

## 2016-12-13 NOTE — Anesthesia Postprocedure Evaluation (Signed)
Anesthesia Post Note  Patient: Linda Reid  Procedure(s) Performed: Procedure(s) (LRB): RIGHT HEMI-THYROIDECTOMY (Right)  Patient location during evaluation: PACU Anesthesia Type: General Level of consciousness: sedated and patient cooperative Pain management: pain level controlled Vital Signs Assessment: post-procedure vital signs reviewed and stable Respiratory status: spontaneous breathing Cardiovascular status: stable Anesthetic complications: no       Last Vitals:  Vitals:   12/13/16 1126 12/13/16 1130  BP: 120/72 118/74  Pulse: 83 77  Resp: 14 18  Temp: 36.5 C     Last Pain:  Vitals:   12/13/16 1130  TempSrc:   PainSc: [place]

## 2016-12-13 NOTE — Op Note (Signed)
DATE OF PROCEDURE:  12/13/2016                              OPERATIVE REPORT  SURGEON:  Leta Baptist, MD  PREOPERATIVE DIAGNOSES: 1. Right thyroid mass  POSTOPERATIVE DIAGNOSES:  1. Right thyroid mass  PROCEDURE PERFORMED:  Right hemithyroidectomy  ANESTHESIA:  General endotracheal tube anesthesia.  COMPLICATIONS:  None.  ESTIMATED BLOOD LOSS:  Less than 8ml  INDICATION FOR PROCEDURE:  Linda Reid is a 31 y.o. female with a history of chronic throat discomfort and a large right thyroid mass. The patient was previously evaluated at the [facility] emergency room. Her neck CT scan showed a large 4.8 cm right thyroid mass. The right thyroid mass was noted to compress the trachea, causing significant leftward displacement of the trachea. The patient complains of chronic dysphagia and neck discomfort as a result of the thyroid compression. Based on the above findings, the decision was made for the patient to undergo the right hemithyroidectomy procedure. Likelihood of success in reducing symptoms was also discussed.  The risks, benefits, alternatives, and details of the procedure were discussed with the patient.  Questions were invited and answered.  Informed consent was obtained.  DESCRIPTION:  The patient was taken to the operating room and placed supine on the operating table.  General endotracheal tube anesthesia was administered by the anesthesiologist.  The patient was positioned and prepped and draped in a standard fashion for thyroid surgery. A recurrent laryngeal nerve monitoring system was used. The system was functional throughout the case.  1% lidocaine with 1-100,000 epinephrine was infiltrated at the planned site of incision. A lower neck transverse neck incision was made. The incision was carried down to the level of the platysma muscles. Superior and inferiorly based subplatysmal flaps were elevated in a standard fashion. The strap muscles were divided at midline and  retracted laterally laterally, exposing the thyroid gland. A large right thyroid mass was noted. The right thyroid mass, together with the rest of the right thyroid lobe, was carefully resected free from the surrounding soft tissue. The recurrent laryngeal nerve was identified and preserved. The nerve was noted to be functional throughout the case. The thyroid mass was sent to the pathology department for permanent histologic identification. The surgical site was copiously irrigated. A #10 JP drain was placed.  The strap muscles were reapproximated using running Vicryl sutures. The skin incision was closed in layers with 4-0 Vicryl and Dermabond.     The care of the patient was turned over to the anesthesiologist.  The patient was awakened from anesthesia without difficulty.  The patient was extubated and transferred to the recovery room in good condition.  OPERATIVE FINDINGS:  Nearly 5 cm right thyroid mass was noted.   SPECIMEN: Right thyroid lobe.   FOLLOWUP CARE:  The patient will be observed overnight. She will most likely be discharged home on postop day #1.   Trueman Worlds W Vi Whitesel 12/13/2016 11:20 AM

## 2016-12-13 NOTE — H&P (Signed)
Cc: Right thyroid mass  HPI: The patient is a 31 year old female who presents today for evaluation of throat discomfort and her large right thyroid mass.  The patient is seen in consultation requested by [facility]. According to the patient, she first noted right neck swelling one month ago.  She was evaluated at the [facility].  Her neck CT scan showed a large 4.8 cm right thyroid mass.  The right thyroid mass was noted to compress on the trachea, causing significant leftward displacement of the trachea.  The patient presents today complaining of recurrent dysphagia and neck discomfort.  She previously underwent adenotonsillectomy surgery at age 38.  She has no other history of ENT surgery.  Due to the size of the thyroid mass, the patient would like to have it removed.   The patient's review of systems (constitutional, eyes, ENT, cardiovascular, respiratory, GI, musculoskeletal, skin, neurologic, psychiatric, endocrine, hematologic, allergic) is noted in the ROS questionnaire.  It is reviewed with the patient.  Family health history: None noted. Major events: Tonsillectomy.  Ongoing medical problems: Chest pain, weight loss, arthritis, headache,migraine, thyroid disease.  Social history: The patient is married. She denies the use of tobacco, alcohol or illegal drugs.   Exam General: Communicates without difficulty, well nourished, no acute distress. Head: Normocephalic, no evidence injury, no tenderness, facial buttresses intact without stepoff. Face/sinus: No tenderness to palpation and percussion. Facial movement is normal and symmetric. Eyes: PERRL, EOMI. No scleral icterus, conjunctivae clear. Neuro: CN II exam reveals vision grossly intact.  No nystagmus at any point of gaze. Ears: Auricles well formed without lesions.  Ear canals are intact without mass or lesion.  No erythema or edema is appreciated.  The TMs are intact without fluid. Nose: External evaluation reveals  normal support and skin without lesions.  Dorsum is intact.  Anterior rhinoscopy reveals healthy pink mucosa over anterior aspect of inferior turbinates and intact septum.  No purulence noted. Oral:  Oral cavity and oropharynx are intact, symmetric, without erythema or edema.  Mucosa is moist without lesions. Neck: Full range of motion without pain.  There is no significant lymphadenopathy.  No masses palpable.  Thyroid bed with a large right thyroid lobe.  Parotid glands and submandibular glands equal bilaterally without mass.  Trachea is midline. Neuro:  CN 2-12 grossly intact. Gait normal.   Procedure:  Flexible Fiberoptic Laryngoscopy Risks, benefits, and alternatives of flexible endoscopy were explained to the patient.  Specific mention was made of the risk of throat numbness with difficulty swallowing, possible bleeding from the nose and mouth, and pain from the procedure.  The patient gave oral consent to proceed.  The nasal cavities were decongested and anesthetised with a combination of oxymetazoline and 4% lidocaine solution.  The flexible scope was inserted into the right nasal cavity and advanced towards the nasopharynx.  Visualized mucosa over the turbinates and septum were as described above.  The nasopharynx was clear.  Oropharyngeal walls were symmetric and mobile without lesion, mass, or edema.  Hypopharynx was also without  lesion or edema.  Larynx was mobile without lesions. Supraglottic structures were free of edema, mass, and asymmetry.  True vocal folds were white without mass or lesion.  Base of tongue was within normal limits.   Assessment 1.  The patient has a large 4.8 cm right thyroid mass, causing significant compression and displacement of her trachea.  2.  The patient also has recurrent neck discomfort and dysphagia, secondary to the right neck  mass.  3.  Her laryngoscopy exam is otherwise normal.  Her vocal cords are mobile bilaterally.    Plan  1.  The physical exam and  laryngoscopy findings are reviewed with the patient.  The CT images are also reviewed.  2.  The treatment options of conservative observation versus right hemithyroidectomy are discussed.  The risks, benefits, alternatives and details of the procedure are extensively reviewed.   3.  The patient would like to proceed with the right hemithyroidectomy surgery.  We will schedule the procedure in accordance with the patient's schedule.

## 2016-12-13 NOTE — Progress Notes (Signed)
Pt c/o pressure in her ears and at the base of her skull/ upper posterior neck. On exam no swelling noted.  Dr Benjamine Mola notified via phone. He feels it may be related to the position in the OR and not the surgery.

## 2016-12-13 NOTE — Transfer of Care (Signed)
Immediate Anesthesia Transfer of Care Note  Patient: Linda Reid  Procedure(s) Performed: Procedure(s): RIGHT HEMI-THYROIDECTOMY (Right)  Patient Location: PACU  Anesthesia Type:General  Level of Consciousness: awake, alert  and drowsy  Airway & Oxygen Therapy: Patient Spontanous Breathing and Patient connected to face mask oxygen  Post-op Assessment: Report given to RN and Post -op Vital signs reviewed and stable  Post vital signs: Reviewed and stable  Last Vitals:  Vitals:   12/13/16 0826  BP: 113/70  Pulse: 76  Resp: 18  Temp: 36.9 C    Last Pain:  Vitals:   12/13/16 0826  TempSrc: Oral  PainSc: 4       Patients Stated Pain Goal: 3 (38/18/40 3754)  Complications: No apparent anesthesia complications

## 2016-12-13 NOTE — Telephone Encounter (Signed)
LMOM that per AA, MRI c-spine is normal.  She does not need to return this call unless she has questions/fim

## 2016-12-13 NOTE — Discharge Instructions (Signed)
Thyroidectomy, Care After Refer to this sheet in the next few weeks. These instructions provide you with information about caring for yourself after your procedure. Your health care provider may also give you more specific instructions. Your treatment has been planned according to current medical practices, but problems sometimes occur. Call your health care provider if you have any problems or questions after your procedure. What can I expect after the procedure? After your procedure, it is typical to have:  Mild pain in the neck or upper body, especially when swallowing.  A sore throat.  A weak voice. Follow these instructions at home:  Take medicines only as directed by your health care provider.  Do not take medicines that contain aspirin and ibuprofen until your health care provider says that you can. These medicines can increase your risk of bleeding.  Some pain medicines cause constipation. Drink enough fluid to keep your urine clear or pale yellow. This can help to prevent constipation.  Start slowly with eating. You may need to have only liquids and soft foods for a few days or as directed by your health care provider.  Resume your usual activities as directed by your health care provider.  For the first 10 days after the procedure or as instructed by your health care provider:  Do not lift anything heavier than 20 lb (9.1 kg).  Do not jog, swim, or do other strenuous exercises.  Do not play contact sports.  Keep all follow-up visits as directed by your health care provider. This is important. Contact a health care provider if:  The soreness in your throat gets worse.  You have increased pain at your incision or incisions.  You have increased bleeding from an incision.  Your incision becomes infected. Watch for:  Swelling.  Redness.  Warmth.  Pus.  You notice a bad smell coming from an incision or dressing.  You have a fever.  You feel lightheaded or  faint.  You have numbness, tingling, or muscle spasms in your:  Arms.  Hands.  Feet.  Face.  You have trouble swallowing. Get help right away if:  You develop a rash.  You have difficulty breathing.  You hear whistling noises coming from your chest.  You develop a cough that gets worse.  Your speech changes, or you have hoarseness that gets worse. This information is not intended to replace advice given to you by your health care provider. Make sure you discuss any questions you have with your health care provider. Document Released: 02/26/2005 Document Revised: 04/11/2016 Document Reviewed: 01/09/2014 Elsevier Interactive Patient Education  2017 [employer].

## 2016-12-13 NOTE — Anesthesia Preprocedure Evaluation (Addendum)
Anesthesia Evaluation  Patient identified by MRN, date of birth, ID band Patient awake    Reviewed: Allergy & Precautions, NPO status , Patient's Chart, lab work & pertinent test results  Airway Mallampati: I  TM Distance: >3 FB Neck ROM: Full    Dental no notable dental hx.    Pulmonary neg pulmonary ROS,    Pulmonary exam normal breath sounds clear to auscultation       Cardiovascular negative cardio ROS Normal cardiovascular exam Rhythm:Regular Rate:Normal     Neuro/Psych  Headaches, PSYCHIATRIC DISORDERS Depression    GI/Hepatic negative GI ROS, Neg liver ROS,   Endo/Other  negative endocrine ROSHyperthyroidism   Renal/GU negative Renal ROS     Musculoskeletal  (+) Arthritis ,   Abdominal   Peds  Hematology negative hematology ROS (+)   Anesthesia Other Findings   Reproductive/Obstetrics negative OB ROS                             Anesthesia Physical Anesthesia Plan  ASA: II  Anesthesia Plan: General   Post-op Pain Management:    Induction: Intravenous  Airway Management Planned: Oral ETT  Additional Equipment:   Intra-op Plan:   Post-operative Plan: Extubation in OR  Informed Consent: I have reviewed the patients History and Physical, chart, labs and discussed the procedure including the risks, benefits and alternatives for the proposed anesthesia with the patient or authorized representative who has indicated his/her understanding and acceptance.   Dental advisory given  Plan Discussed with: CRNA  Anesthesia Plan Comments:         Anesthesia Quick Evaluation

## 2016-12-13 NOTE — Telephone Encounter (Signed)
-----   Message from Melvenia Beam, MD sent at 12/11/2016 12:38 PM EDT ----- MRI cervical spine normal thanks

## 2016-12-13 NOTE — Anesthesia Procedure Notes (Signed)
Procedure Name: Intubation Date/Time: 12/13/2016 9:32 AM Performed by: Melynda Ripple D Pre-anesthesia Checklist: Patient identified, Emergency Drugs available, Suction available and Patient being monitored Patient Re-evaluated:Patient Re-evaluated prior to inductionOxygen Delivery Method: Circle system utilized Preoxygenation: Pre-oxygenation with 100% oxygen Intubation Type: IV induction Ventilation: Mask ventilation without difficulty Laryngoscope Size: Mac and 3 Grade View: Grade I Tube type: Oral (7.0 nims) Tube size: 7.0 mm Number of attempts: 1 Airway Equipment and Method: Stylet and Oral airway Placement Confirmation: ETT inserted through vocal cords under direct vision,  positive ETCO2 and breath sounds checked- equal and bilateral Secured at: 18 cm Tube secured with: Tape Dental Injury: Teeth and Oropharynx as per pre-operative assessment

## 2016-12-14 ENCOUNTER — Encounter (HOSPITAL_BASED_OUTPATIENT_CLINIC_OR_DEPARTMENT_OTHER): Payer: Self-pay | Admitting: Otolaryngology

## 2016-12-14 NOTE — Addendum Note (Signed)
Addendum  created 12/14/16 0802 by Marrianne Mood, CRNA   Anesthesia Intra LDAs edited, LDA properties accepted

## 2016-12-14 NOTE — Discharge Summary (Signed)
Physician Discharge Summary  Patient ID: Linda Reid MRN: [id number] DOB/AGE: 1985/08/30 31 y.o.  Admit date: 12/13/2016 Discharge date: 12/14/2016  Admission Diagnoses: Right thyroid mass  Discharge Diagnoses: Right thyroid mass Active Problems:   S/P partial thyroidectomy   Discharged Condition: good  Hospital Course: Pt had an uneventful overnight stay. No bleeding. No stridor.  Consults: None  Significant Diagnostic Studies: None  Treatments: surgery: Right hemithyroidectomy  Discharge Exam: Blood pressure 113/75, pulse 73, temperature 97.7 F (36.5 C), resp. rate 14, height 5\' 9"  (1.753 m), weight 169 lb 12.8 oz (77 kg), SpO2 97 %, not currently breastfeeding. Incision/Wound:c/d/I Voice is normal  Disposition: 01-Home or Self Care  Discharge Instructions    Activity as tolerated - No restrictions    Complete by:  As directed    Diet general    Complete by:  As directed      Allergies as of 12/14/2016   No Known Allergies     Medication List    STOP taking these medications   aspirin-acetaminophen-caffeine 250-250-65 MG tablet Commonly known as:  EXCEDRIN MIGRAINE   traMADol 50 MG tablet Commonly known as:  ULTRAM     TAKE these medications   amoxicillin 875 MG tablet Commonly known as:  AMOXIL Take 1 tablet (875 mg total) by mouth 2 (two) times daily.   baclofen 10 MG tablet Commonly known as:  LIORESAL Take 10 mg by mouth 3 (three) times daily as needed for muscle spasms.   DULoxetine 30 MG capsule Commonly known as:  CYMBALTA Take 1 capsule (30 mg total) by mouth daily.   gabapentin 100 MG capsule Commonly known as:  NEURONTIN Take 100 mg by mouth 3 (three) times daily.   methimazole 5 MG tablet Commonly known as:  TAPAZOLE Take 5 mg by mouth daily.   NEXPLANON 68 MG Impl implant Generic drug:  etonogestrel 1 each by Subdermal route once.   oxyCODONE-acetaminophen 5-325 MG tablet Commonly known as:  ROXICET Take 1 tablet  by mouth every 4 (four) hours as needed for severe pain.   Rotigotine 1 MG/24HR Pt24 Commonly known as:  NEUPRO Place 1 patch (1 mg total) onto the skin at bedtime.      Follow-up Information    Yahel Fuston W Daryus Sowash, MD Follow up in 1 week(s).   Specialty:  Otolaryngology Why:  As scheduled Contact information: [address] [place] [address] [phone]           Signed: Burley Saver 12/14/2016, 7:29 AM

## 2016-12-23 ENCOUNTER — Ambulatory Visit: Payer: Medicaid Other | Admitting: Psychology

## 2016-12-24 ENCOUNTER — Encounter (HOSPITAL_BASED_OUTPATIENT_CLINIC_OR_DEPARTMENT_OTHER): Payer: Self-pay | Admitting: Otolaryngology

## 2016-12-24 ENCOUNTER — Ambulatory Visit: Payer: Medicaid Other | Admitting: Rehabilitative and Restorative Service Providers"

## 2016-12-30 ENCOUNTER — Ambulatory Visit (INDEPENDENT_AMBULATORY_CARE_PROVIDER_SITE_OTHER): Payer: Self-pay | Admitting: Otolaryngology

## 2016-12-30 NOTE — Telephone Encounter (Signed)
mailed

## 2017-01-04 ENCOUNTER — Ambulatory Visit: Payer: Medicaid Other | Admitting: Sports Medicine

## 2017-01-05 ENCOUNTER — Ambulatory Visit (INDEPENDENT_AMBULATORY_CARE_PROVIDER_SITE_OTHER): Payer: Medicaid Other | Admitting: Psychology

## 2017-01-05 DIAGNOSIS — F321 Major depressive disorder, single episode, moderate: Secondary | ICD-10-CM | POA: Diagnosis not present

## 2017-01-11 NOTE — Telephone Encounter (Signed)
I have called Linda Reid and left her a message to please call me back about scheduling.

## 2017-01-12 ENCOUNTER — Ambulatory Visit (INDEPENDENT_AMBULATORY_CARE_PROVIDER_SITE_OTHER): Payer: Medicaid Other | Admitting: Psychology

## 2017-01-12 DIAGNOSIS — F321 Major depressive disorder, single episode, moderate: Secondary | ICD-10-CM

## 2017-01-13 ENCOUNTER — Ambulatory Visit (INDEPENDENT_AMBULATORY_CARE_PROVIDER_SITE_OTHER): Payer: Medicaid Other | Admitting: Otolaryngology

## 2017-01-18 NOTE — Telephone Encounter (Signed)
Called Amy At [facility] asking her to please call me back. Relayed on Amy's voice mail I would be here today all day. I will be out of the  office 05/30 and 05/31 back on Friday June 1st. I need to see status of patient's apt.

## 2017-01-18 NOTE — Telephone Encounter (Signed)
Spoke to Linda Reid Patient needs to Have MRI of Brain first. Patient has not had yet. [facility] # [id number]. I have spoke to Patient and relayed she has to have this MRI before [facility] can accept her.

## 2017-01-19 ENCOUNTER — Ambulatory Visit (INDEPENDENT_AMBULATORY_CARE_PROVIDER_SITE_OTHER): Payer: Medicaid Other | Admitting: Psychology

## 2017-01-19 DIAGNOSIS — F321 Major depressive disorder, single episode, moderate: Secondary | ICD-10-CM

## 2017-01-21 NOTE — Telephone Encounter (Signed)
Patient is scheduled for MRI of The [facility] 01/28/2017. I will get CD and Mail to Amy at [facility]. I have spoke with patient she is aware of details as well.

## 2017-01-26 ENCOUNTER — Ambulatory Visit: Payer: Medicaid Other | Admitting: Psychology

## 2017-01-28 ENCOUNTER — Other Ambulatory Visit: Payer: Self-pay

## 2017-01-28 LAB — TSH: TSH: 0.35 — AB (ref 0.41–5.90)

## 2017-02-02 ENCOUNTER — Ambulatory Visit (INDEPENDENT_AMBULATORY_CARE_PROVIDER_SITE_OTHER): Payer: Medicaid Other | Admitting: Psychology

## 2017-02-02 DIAGNOSIS — F321 Major depressive disorder, single episode, moderate: Secondary | ICD-10-CM | POA: Diagnosis not present

## 2017-02-04 ENCOUNTER — Encounter: Payer: Self-pay | Admitting: Neurology

## 2017-02-07 ENCOUNTER — Ambulatory Visit (INDEPENDENT_AMBULATORY_CARE_PROVIDER_SITE_OTHER): Payer: Medicaid Other | Admitting: Otolaryngology

## 2017-02-09 ENCOUNTER — Telehealth: Payer: Self-pay | Admitting: Neurology

## 2017-02-09 ENCOUNTER — Ambulatory Visit (INDEPENDENT_AMBULATORY_CARE_PROVIDER_SITE_OTHER): Payer: Medicaid Other | Admitting: Psychology

## 2017-02-09 DIAGNOSIS — F321 Major depressive disorder, single episode, moderate: Secondary | ICD-10-CM | POA: Diagnosis not present

## 2017-02-09 NOTE — Telephone Encounter (Signed)
Pt scheduled appt for 1st available  7/23  She is on wait list

## 2017-02-15 NOTE — Telephone Encounter (Signed)
Called and left Amy another message about scheduling.

## 2017-02-16 ENCOUNTER — Ambulatory Visit (INDEPENDENT_AMBULATORY_CARE_PROVIDER_SITE_OTHER): Payer: Medicaid Other | Admitting: Psychology

## 2017-02-16 DIAGNOSIS — F321 Major depressive disorder, single episode, moderate: Secondary | ICD-10-CM

## 2017-02-16 NOTE — Telephone Encounter (Signed)
Patient No- showed her MRI of the [place] . Called and spoke to patient and relayed for her to [place] and reschedule . Explained to patient she can't get an apt with [facility] until she has MRI of the Brain . Patient relayed she understood and would call and reschedule.

## 2017-03-02 ENCOUNTER — Ambulatory Visit: Payer: Medicaid Other | Admitting: Psychology

## 2017-03-09 ENCOUNTER — Encounter: Payer: Self-pay | Admitting: Rehabilitative and Restorative Service Providers"

## 2017-03-09 NOTE — Therapy (Signed)
[place] [place] [address] [place], [place], [address] Phone: [phone]   Fax:  [phone]  Patient Details  Name: Linda Reid MRN: [id number] Date of Birth: 1986-01-01 Referring Provider:  No ref. provider found  Encounter Date: last encounter 11/09/16  PHYSICAL THERAPY DISCHARGE SUMMARY  Visits from Start of Care: eval only--did not return.  Current functional level related to goals / functional outcomes: PT evaluation completed and patient did not show for f/u visit.  See summary for patient status.  Plan: Patient agrees to discharge.  Patient goals were not met. Patient is being discharged due to not returning since the last visit.  ?????        Thank you for the referral of this patient. Rudell Cobb, MPT    [place] 03/09/2017, 2:00 PM  [facility] [address] [place], [place], [address] Phone: [phone]   Fax:  [phone]

## 2017-03-14 ENCOUNTER — Encounter: Payer: Self-pay | Admitting: Neurology

## 2017-03-14 ENCOUNTER — Ambulatory Visit (INDEPENDENT_AMBULATORY_CARE_PROVIDER_SITE_OTHER): Payer: Medicaid Other | Admitting: Neurology

## 2017-03-14 VITALS — BP 111/74 | HR 74 | Ht 69.0 in | Wt 172.9 lb

## 2017-03-14 DIAGNOSIS — G9600 Cerebrospinal fluid leak, unspecified: Secondary | ICD-10-CM

## 2017-03-14 DIAGNOSIS — M79604 Pain in right leg: Secondary | ICD-10-CM

## 2017-03-14 DIAGNOSIS — G2581 Restless legs syndrome: Secondary | ICD-10-CM

## 2017-03-14 DIAGNOSIS — R5382 Chronic fatigue, unspecified: Secondary | ICD-10-CM | POA: Diagnosis not present

## 2017-03-14 MED ORDER — FLUOXETINE HCL 20 MG PO CAPS: 20.0000 mg | ORAL_CAPSULE | Freq: Every day | ORAL | 6 refills | Status: DC

## 2017-03-14 MED ORDER — GABAPENTIN 300 MG PO CAPS: 300.0000 mg | ORAL_CAPSULE | Freq: Three times a day (TID) | ORAL | 11 refills | Status: DC

## 2017-03-14 NOTE — Progress Notes (Signed)
[facility]    Provider:  Dr Jaynee Eagles Referring Provider: Raiford Simmonds., PA-C Primary Care Physician:  Raiford Simmonds., PA-C  CC: headache  Interval history 03/14/2017: She is scheduled for this week for MRI of the brain and is seeing [facility] for persistent headache with csf leak and blood patch x 3. She has had multiple imaging, CT myelogram. She continues to have neck pain. Headaches still occurring but improved. She has a thyroid condition. She had surgery and is following with Endocrinology. She is having a lot of symptoms, hair breaking off. She is going to see [facility] for her supposedly continued csf leak. She has numbness on the right leg, shooting pain down the back, numbness under her knee to the feet. Mainly the right leg. She went to physical therapy and saw christine Weaver. She is having restless legs at night. If she lays still she has a throbbing and aching pain at night, in both  Legs like she has to move them. She takes Naproxen at night, she takes gabapentin twice a day 100mg . Will increase gabapentin to 300mg  three times a day. Will check Ferritin.   Interval history 10/25/2016: CT myelogram revealed a csf leak and patient was treated with blood patch (her third) at the site of the leak. She still continues to have symptoms, she has the headaches as well as a sharp intense pain 3-4 times a week on the right around the eye. She continues to have neck pain and pressure in the neck and in the head similar to what she felt after the LP and csf leak. She feels like something is "pulling". Her neck is sore. The neck starts to hurt when sitting for long periods of time. The pain is in the neck and shoulder and she has radiation into the left arm. Will order an MRI cervical spine. She has left arm weakness. Will order an emg/ncs, also physical therapy. If no etiology found then may refer to [facility] dr gray for further eval of csf leaks especially given the root sleeve cysts  which could be sites of spontaneous csf leaks.  FINDINGS: CERVICAL, THORACIC, AND LUMBAR MYELOGRAM FINDINGS:  The nerve roots fill normally throughout the lumbar spine. There somewhat bulbous bilaterally at S2. There is prominence of the nerve root sheath at L5 on the left and L2 and L3 on the right. No definite leak is evident on the conventional images.  No significant thoracic disc herniation is evident. No focal leak of contrast is evident within the thoracic spine.  A mild disc osteophyte complex is present with slight blunting of the left-sided nerve root sheath at C5-6. Nerve root she is otherwise fill normally on both sides. Cervical vertebral body height and alignment are normal.  CT CERVICAL MYELOGRAM FINDINGS:  Seven cervical type vertebral bodies are present. Vertebral body heights alignment are maintained. Minimal uncovertebral spurring is present bilaterally at C5-6. There is prominence of the nerve sheath on the right at C2-3 and on the right at C5-6. No focal CSF leak is evident.  No focal disc herniation or stenosis is present.  CT LUMBAR MYELOGRAM FINDINGS:  Five non rib-bearing lumbar type vertebral bodies are present. Vertebral body heights alignment are maintained.  Limited imaging of the abdomen is unremarkable. The conus medullaris terminates at L1-2, within normal limits.  Mild facet hypertrophy is present at L2-3, L3-4, L4-5, and L5-S1.  Contrast can be seen leaking around the right L1 nerve root at the L1-2 level there is  slight prominence of the nerve root sheath on the left.  L1-2: Prominent perineural root sleeve cysts are noted bilaterally.  L3-4: Prominent perineural root sleeve cysts are present bilaterally.  L4-5: A mild broad-based disc protrusion is present without significant stenosis.  L5-S1: Mild disc bulging and facet hypertrophy are present. Moderate foraminal stenosis is worse on the left.  CT THORACIC  MYELOGRAM FINDINGS:  Twelve rib-bearing thoracic type vertebral bodies are present. Vertebral body heights and alignment are normal. There is no focal disc protrusion. Asymmetric facet hypertrophy on the right results in mild right foraminal stenosis at T10-11. Contrast is noted along the nerve root sheath on the right at T12-L1. No other focal foraminal contrast is present in the thoracic spine.  IMPRESSION: 1. CSF leak on the right at L1-2. This may be associated with the patient's headaches. Epidural blood patch could be performed at this level. 2. Prominent perineural root sleeve cysts in the lumbar and cervical spine without contrast leak elsewhere. Bulbous perineural root sleeve cysts can predispose to spontaneous CSF leak. 3. Mild disc bulging and moderate facet hypertrophy at L5-S1 with moderate foraminal stenosis bilaterally, worse on the left. 4. Mild right foraminal narrowing at T10-11 secondary to facet spurring.    PPJ:KDTOIZT S Chandleris a 31 y.o.femalehere as a referral from Dr. Noreene Filbert headaches.Past medical history of depressive disorder, preterm labor, abdominal pain. Patient has ongoing headaches since epidural steroid injection.Never had headaches before ESI. ESI was in July 2017 on a Wednesday, the next day had a headaches, worsening, it was excruciating, it was better laying down and worse sitting or standing definitely positional, she felt like she was going to pass out. It was mostly on the back of the head towards the neck. She had a blood patch on the 17th which helped but did not resolved the headache. She started having a lot of neck pain, stiffness and aching, never had double vision orfacial weakness (no cranial neuropathies), headache made her feel fatigued. The headache was still there albeit a little better but more random, still every day, wasn;t as severe but was still having them every day. Headache continuedfor months without improvement,  she eventually had a second blood patch without anymore improvement. Currently she still has neck pain, generalized weakness, heaviness of the head centeredat the neck and mid back but no arm weakness or radicular symptoms. Headache now is worse when she is up and moving around for a while, still better with laying down, less severe positionally but there still is a definitive positional component. Throughout the day it is more of a pressure in a band around the head, she has had some blurry vision with the headaches,dizziness, headaches are daily, waxing and waning worse during the day when upright mostly better in the morning but still there. On average is a 8/10 pain and discomfort. She had to stop coaching volleyball, headachessignificantly affecting her daily life. Her profession is security. OTC meds have not helped, ibuprofen/tylenol,excedrin, indomethacin. Also continued LBP with radicular symptoms, ESI did not improve her L5/S1 radicular pain and this is making her security job difficult, no other associated symptoms, modifying factors or focal deficits. + nausea. No current vomiting, diplopia (but does endorse vision changes blurred vision), facial weakness, photophobia.    Reviewed notes, labs and imaging from outside physicians, which showed:  Patient had a successful epidural steroid injection 03/03/2016 where Depo-Medrol and lidocaine via an interlaminar approach was performed on the right L5-S1 which was successful the procedure was well tolerated  and the patient was discharged 30 minutes following the injection and good condition. Patient developed a post epidural headache and that a technically successful lumbar epidural blood patch on the right at L5-S1 on 03/08/2016. On 05/28/2016 a lumbar epidural blood patch was repeated due to continued headache. Both blood patches were successful on the right at L5-S1.    BUN 14 and creatinine is 0.93 03/27/2016  Personally reviewed MRI  images of the lumbar spine June 2017 and agree with the following: Diffuse lumbar facet degeneration without significant spinal stenosis  Right foraminal narrowing L5-S1 without nerve root compression.  Personally reviewed CT of the head images on 03/27/2016 which showed no acute intracranial pathology. CTshowed no acute intracranial abnormalities including mass lesion or mass effect, hydrocephalus, extra-axial fluid collection, midline shift, hemorrhage, or acute infarction, large ischemic events (personally reviewed images).      Review of Systems: Patient complains of symptoms per HPI as well as the following symptoms: Weight loss, fatigue, blurred vision, swelling in legs, joint pain, joint swelling, cramps, aching muscles, headache, numbness, weakness, insomnia, not enough sleep, change in appetite. Pertinent negatives per HPI. All others negative.    Social History   Social History  . Marital status: Married    Spouse name: Psychiatrist   . Number of children: 3  . Years of education: 4 Bachelor   Occupational History  . 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 Smoking status: Never Smoker  . Smokeless tobacco: Never Used  . Alcohol use No  . Drug use: No  . Sexual activity: Yes    Birth control/ protection: Implant   Other Topics Concern  . Not on file   Social History Narrative   Patient lives with her fiance and 3 children.    Her fiance is the father of her current pregnancy, other three children were fathered by her previous partner.    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     Caffeine use: none   Right-handed    Family History  Problem Relation Age of Onset  . Cancer Maternal Grandmother        breast  . Diabetes Maternal Grandmother   . Hypertension Maternal Grandmother   . Heart  disease Maternal Grandfather   . Hypertension Maternal Grandfather   . Hypertension Paternal Grandmother   . Hypertension Paternal Grandfather   . Premature birth Daughter   . Asthma Daughter   . Diabetes Maternal Aunt   . Hypertension Maternal Aunt   . Stroke Maternal Aunt   . Diabetes Maternal Uncle   . Hypertension Maternal Uncle   . Hypertension Paternal Aunt   . Hypertension Paternal Uncle     Past Medical History:  Diagnosis Date  . Arthritis    lower back, right leg  . Back pain   . Cerebrospinal fluid leak from spinal puncture 03/03/2016   after epidural steroid injection; states has had 2 blood patch procedures   . Difficulty swallowing solids    certain foods  . Hyperthyroidism   . Migraines   . Nasal congestion 12/06/2016  . Neck pain of over 3 months duration    posterior and right side  . Neuropathy    right leg  . Restless leg syndrome   . Thyroid nodule 11/2016   right  . Tracheal deviation  due to thyroid nodule, per pt.  . Urinary frequency     Past Surgical History:  Procedure Laterality Date  . DG MYLEOGRAM LUMBAR SPINE ([place] HX)    . DILATION AND CURETTAGE OF UTERUS  06/25/2002  . THYROIDECTOMY Right 12/13/2016   Procedure: RIGHT HEMI-THYROIDECTOMY;  Surgeon: Leta Baptist, MD;  Location: [place];  Service: ENT;  Laterality: Right;  . TONSILLECTOMY      Current Outpatient Prescriptions  Medication Sig Dispense Refill  . baclofen (LIORESAL) 10 MG tablet Take 10 mg by mouth 3 (three) times daily as needed for muscle spasms.    . DULoxetine (CYMBALTA) 30 MG capsule Take 1 capsule (30 mg total) by mouth daily. 30 capsule 1  . etonogestrel (NEXPLANON) 68 MG IMPL implant 1 each by Subdermal route once.    . gabapentin (NEURONTIN) 300 MG capsule Take 1 capsule (300 mg total) by mouth 3 (three) times daily. 90 capsule 11   Current Facility-Administered Medications  Medication Dose Route Frequency Provider Last Rate Last Dose  .  triamcinolone acetonide (KENALOG) 10 MG/ML injection 10 mg  10 mg Other Once Landis Martins, DPM        Allergies as of 03/14/2017  . (No Known Allergies)    Vitals: BP 111/74   Pulse 74   Ht 5\' 9"  (1.753 m)   Wt 172 lb 14.4 oz (78.4 kg)   BMI 25.53 kg/m  Last Weight:  Wt Readings from Last 1 Encounters:  03/14/17 172 lb 14.4 oz (78.4 kg)   Last Height:   Ht Readings from Last 1 Encounters:  03/14/17 5\' 9"  (1.753 m)   Physical exam: Exam: Gen: NAD, conversant, well nourised,  well groomed                     CV: RRR, no MRG. No Carotid Bruits. No peripheral edema, warm, nontender Eyes: Conjunctivae clear without exudates or hemorrhage  Neuro: Detailed Neurologic Exam  Speech:    Speech is normal; fluent and spontaneous with normal comprehension.  Cognition:    The patient is oriented to person, place, and time;     recent and remote memory intact;     language fluent;     normal attention, concentration,     fund of knowledge Cranial Nerves:    The pupils are equal, round, and reactive to light. The fundi are normal and spontaneous venous pulsations are present. Visual fields are full to finger confrontation. Extraocular movements are intact. Trigeminal sensation is intact and the muscles of mastication are normal. The face is symmetric. The palate elevates in the midline. Hearing intact. Voice is normal. Shoulder shrug is normal. The tongue has normal motion without fasciculations.   Coordination:    Normal finger to nose and heel to shin. Normal rapid alternating movements.   Gait:    Heel-toe and tandem gait are normal.   Motor Observation:    No asymmetry, no atrophy, and no involuntary movements noted. Tone:    Normal muscle tone.    Posture:    Posture is normal. normal erect    Strength:    Strength is V/V in the upper and lower limbs.      Sensation: intact to LT     Reflex Exam:  DTR's:    Deep tendon reflexes in the upper and lower  extremities are normal bilaterally.   Toes:    The toes are downgoing bilaterally.   Clonus:    Clonus is absent.  Assessment/Plan:31 year old female with an orthostatic headache and neck pain likely alow-pressure headache, positional, refractory, after 2 epidural steroid injections. Has had 2 blood patches without complete relief. CT Myelogram showed CSF leak on the right at L1-2 with a 3rd blood patch. She continues to have headache and neck pain. Will refer to [facility] dr gray for further eval of csf leaks refractory to blood patch especially given the prominent perineural root sleeve cysts in the lumbar and cervical Spine; Bulbous perineural root sleeve cysts can predispose to spontaneous CSF leak..  CSF leak: MRI brain pending, follow up with [facility] RLS: Check ferritin, increase Neurontin  Right leg radic: emg/ncs Fatigue: worsened. ;likely related to thyroid problems  Cc: Dr. Jobe Igo, Dr. Maren Reamer, [facility] [address] [place], [place] [address]  Phone [phone] Fax [phone]  A total of 25 minutes was spent face-to-face with this patient. Over half this time was spent on counseling patient on the csf leak, RLS, right leg radiculopathy, fatigue diagnosis and different diagnostic and therapeutic options available.

## 2017-03-14 NOTE — Patient Instructions (Addendum)
Fluoxetine capsules or tablets (Depression/Mood Disorders) What is this medicine? FLUOXETINE (floo OX e teen) belongs to a class of drugs known as selective serotonin reuptake inhibitors (SSRIs). It helps to treat mood problems such as depression, obsessive compulsive disorder, and panic attacks. It can also treat certain eating disorders. This medicine may be used for other purposes; ask your health care provider or pharmacist if you have questions. COMMON BRAND NAME(S): Prozac What should I tell my health care provider before I take this medicine? They need to know if you have any of these conditions: -bipolar disorder or a family history of bipolar disorder -bleeding disorders -glaucoma -heart disease -liver disease -low levels of sodium in the blood -seizures -suicidal thoughts, plans, or attempt; a previous suicide attempt by you or a family member -take MAOIs like Carbex, Eldepryl, Marplan, Nardil, and Parnate -take medicines that treat or prevent blood clots -thyroid disease -an unusual or allergic reaction to fluoxetine, other medicines, foods, dyes, or preservatives -pregnant or trying to get pregnant -breast-feeding How should I use this medicine? Take this medicine by mouth with a glass of water. Follow the directions on the prescription label. You can take this medicine with or without food. Take your medicine at regular intervals. Do not take it more often than directed. Do not stop taking this medicine suddenly except upon the advice of your doctor. Stopping this medicine too quickly may cause serious side effects or your condition may worsen. A special MedGuide will be given to you by the pharmacist with each prescription and refill. Be sure to read this information carefully each time. Talk to your pediatrician regarding the use of this medicine in children. While this drug may be prescribed for children as young as 7 years for selected conditions, precautions do  apply. Overdosage: If you think you have taken too much of this medicine contact a poison control center or emergency room at once. NOTE: This medicine is only for you. Do not share this medicine with others. What if I miss a dose? If you miss a dose, skip the missed dose and go back to your regular dosing schedule. Do not take double or extra doses. What may interact with this medicine? Do not take this medicine with any of the following medications: -other medicines containing fluoxetine, like Sarafem or Symbyax -cisapride -linezolid -MAOIs like Carbex, Eldepryl, Marplan, Nardil, and Parnate -methylene blue (injected into a vein) -pimozide -thioridazine This medicine may also interact with the following medications: -alcohol -amphetamines -aspirin and aspirin-like medicines -carbamazepine -certain medicines for depression, anxiety, or psychotic disturbances -certain medicines for migraine headaches like almotriptan, eletriptan, frovatriptan, naratriptan, rizatriptan, sumatriptan, zolmitriptan -digoxin -diuretics -fentanyl -flecainide -furazolidone -isoniazid -lithium -medicines for sleep -medicines that treat or prevent blood clots like warfarin, enoxaparin, and dalteparin -NSAIDs, medicines for pain and inflammation, like ibuprofen or naproxen -phenytoin -procarbazine -propafenone -rasagiline -ritonavir -supplements like St. John's wort, kava kava, valerian -tramadol -tryptophan -vinblastine This list may not describe all possible interactions. Give your health care provider a list of all the medicines, herbs, non-prescription drugs, or dietary supplements you use. Also tell them if you smoke, drink alcohol, or use illegal drugs. Some items may interact with your medicine. What should I watch for while using this medicine? Tell your doctor if your symptoms do not get better or if they get worse. Visit your doctor or health care professional for regular checks on your  progress. Because it may take several weeks to see the full effects of this medicine, it   is important to continue your treatment as prescribed by your doctor. Patients and their families should watch out for new or worsening thoughts of suicide or depression. Also watch out for sudden changes in feelings such as feeling anxious, agitated, panicky, irritable, hostile, aggressive, impulsive, severely restless, overly excited and hyperactive, or not being able to sleep. If this happens, especially at the beginning of treatment or after a change in dose, call your health care professional. Dennis Bast may get drowsy or dizzy. Do not drive, use machinery, or do anything that needs mental alertness until you know how this medicine affects you. Do not stand or sit up quickly, especially if you are an older patient. This reduces the risk of dizzy or fainting spells. Alcohol may interfere with the effect of this medicine. Avoid alcoholic drinks. Your mouth may get dry. Chewing sugarless gum or sucking hard candy, and drinking plenty of water may help. Contact your doctor if the problem does not go away or is severe. This medicine may affect blood sugar levels. If you have diabetes, check with your doctor or health care professional before you change your diet or the dose of your diabetic medicine. What side effects may I notice from receiving this medicine? Side effects that you should report to your doctor or health care professional as soon as possible: -allergic reactions like skin rash, itching or hives, swelling of the face, lips, or tongue -anxious -black, tarry stools -breathing problems -changes in vision -confusion -elevated mood, decreased need for sleep, racing thoughts, impulsive behavior -eye pain -fast, irregular heartbeat -feeling faint or lightheaded, falls -feeling agitated, angry, or irritable -hallucination, loss of contact with reality -loss of balance or coordination -loss of memory -painful  or prolonged erections -restlessness, pacing, inability to keep still -seizures -stiff muscles -suicidal thoughts or other mood changes -trouble sleeping -unusual bleeding or bruising -unusually weak or tired -vomiting Side effects that usually do not require medical attention (report to your doctor or health care professional if they continue or are bothersome): -change in appetite or weight -change in sex drive or performance -diarrhea -dry mouth -headache -increased sweating -nausea -tremors This list may not describe all possible side effects. Call your doctor for medical advice about side effects. You may report side effects to FDA at 1-800-FDA-1088. Where should I keep my medicine? Keep out of the reach of children. Store at room temperature between 15 and 30 degrees C (59 and 86 degrees F). Throw away any unused medicine after the expiration date. NOTE: This sheet is a summary. It may not cover all possible information. If you have questions about this medicine, talk to your doctor, pharmacist, or health care provider.  2018 Elsevier/Gold Standard (2016-01-10 15:55:27)  Gabapentin capsules or tablets What is this medicine? GABAPENTIN (GA ba pen tin) is used to control partial seizures in adults with epilepsy. It is also used to treat certain types of nerve pain. This medicine may be used for other purposes; ask your health care provider or pharmacist if you have questions. COMMON BRAND NAME(S): Active-PAC with Gabapentin, Gabarone, Neurontin What should I tell my health care provider before I take this medicine? They need to know if you have any of these conditions: -kidney disease -suicidal thoughts, plans, or attempt; a previous suicide attempt by you or a family member -an unusual or allergic reaction to gabapentin, other medicines, foods, dyes, or preservatives -pregnant or trying to get pregnant -breast-feeding How should I use this medicine? Take this medicine by  mouth with  a glass of water. Follow the directions on the prescription label. You can take it with or without food. If it upsets your stomach, take it with food.Take your medicine at regular intervals. Do not take it more often than directed. Do not stop taking except on your doctor's advice. If you are directed to break the 600 or 800 mg tablets in half as part of your dose, the extra half tablet should be used for the next dose. If you have not used the extra half tablet within 28 days, it should be thrown away. A special MedGuide will be given to you by the pharmacist with each prescription and refill. Be sure to read this information carefully each time. Talk to your pediatrician regarding the use of this medicine in children. Special care may be needed. Overdosage: If you think you have taken too much of this medicine contact a poison control center or emergency room at once. NOTE: This medicine is only for you. Do not share this medicine with others. What if I miss a dose? If you miss a dose, take it as soon as you can. If it is almost time for your next dose, take only that dose. Do not take double or extra doses. What may interact with this medicine? Do not take this medicine with any of the following medications: -other gabapentin products This medicine may also interact with the following medications: -alcohol -antacids -antihistamines for allergy, cough and cold -certain medicines for anxiety or sleep -certain medicines for depression or psychotic disturbances -homatropine; hydrocodone -naproxen -narcotic medicines (opiates) for pain -phenothiazines like chlorpromazine, mesoridazine, prochlorperazine, thioridazine This list may not describe all possible interactions. Give your health care provider a list of all the medicines, herbs, non-prescription drugs, or dietary supplements you use. Also tell them if you smoke, drink alcohol, or use illegal drugs. Some items may interact with  your medicine. What should I watch for while using this medicine? Visit your doctor or health care professional for regular checks on your progress. You may want to keep a record at home of how you feel your condition is responding to treatment. You may want to share this information with your doctor or health care professional at each visit. You should contact your doctor or health care professional if your seizures get worse or if you have any new types of seizures. Do not stop taking this medicine or any of your seizure medicines unless instructed by your doctor or health care professional. Stopping your medicine suddenly can increase your seizures or their severity. Wear a medical identification bracelet or chain if you are taking this medicine for seizures, and carry a card that lists all your medications. You may get drowsy, dizzy, or have blurred vision. Do not drive, use machinery, or do anything that needs mental alertness until you know how this medicine affects you. To reduce dizzy or fainting spells, do not sit or stand up quickly, especially if you are an older patient. Alcohol can increase drowsiness and dizziness. Avoid alcoholic drinks. Your mouth may get dry. Chewing sugarless gum or sucking hard candy, and drinking plenty of water will help. The use of this medicine may increase the chance of suicidal thoughts or actions. Pay special attention to how you are responding while on this medicine. Any worsening of mood, or thoughts of suicide or dying should be reported to your health care professional right away. Women who become pregnant while using this medicine may enroll in the [employer] Antiepileptic Drug  Pregnancy Registry by calling [phone]. This registry collects information about the safety of antiepileptic drug use during pregnancy. What side effects may I notice from receiving this medicine? Side effects that you should report to your doctor or health care professional  as soon as possible: -allergic reactions like skin rash, itching or hives, swelling of the face, lips, or tongue -worsening of mood, thoughts or actions of suicide or dying Side effects that usually do not require medical attention (report to your doctor or health care professional if they continue or are bothersome): -constipation -difficulty walking or controlling muscle movements -dizziness -nausea -slurred speech -tiredness -tremors -weight gain This list may not describe all possible side effects. Call your doctor for medical advice about side effects. You may report side effects to FDA at 1-800-FDA-1088. Where should I keep my medicine? Keep out of reach of children. This medicine may cause accidental overdose and death if it taken by other adults, children, or pets. Mix any unused medicine with a substance like cat litter or coffee grounds. Then throw the medicine away in a sealed container like a sealed bag or a coffee can with a lid. Do not use the medicine after the expiration date. Store at room temperature between 15 and 30 degrees C (59 and 86 degrees F). NOTE: This sheet is a summary. It may not cover all possible information. If you have questions about this medicine, talk to your doctor, pharmacist, or health care provider.  2018 Elsevier/Gold Standard (2013-10-05 15:26:50)

## 2017-03-16 ENCOUNTER — Ambulatory Visit (INDEPENDENT_AMBULATORY_CARE_PROVIDER_SITE_OTHER): Payer: Medicaid Other | Admitting: Psychology

## 2017-03-16 ENCOUNTER — Ambulatory Visit (INDEPENDENT_AMBULATORY_CARE_PROVIDER_SITE_OTHER): Payer: Medicaid Other | Admitting: "Endocrinology

## 2017-03-16 ENCOUNTER — Encounter: Payer: Self-pay | Admitting: "Endocrinology

## 2017-03-16 VITALS — BP 121/72 | HR 80 | Ht 69.0 in | Wt 172.0 lb

## 2017-03-16 DIAGNOSIS — E0590 Thyrotoxicosis, unspecified without thyrotoxic crisis or storm: Secondary | ICD-10-CM | POA: Diagnosis not present

## 2017-03-16 DIAGNOSIS — F321 Major depressive disorder, single episode, moderate: Secondary | ICD-10-CM | POA: Diagnosis not present

## 2017-03-16 LAB — T4, FREE: FREE T4: 1 ng/dL (ref 0.8–1.8)

## 2017-03-16 LAB — T3, FREE: T3, Free: 2.8 pg/mL (ref 2.3–4.2)

## 2017-03-16 LAB — TSH: TSH: 0.57 mIU/L

## 2017-03-16 NOTE — Progress Notes (Signed)
Subjective:    Patient ID: Linda Reid, female    DOB: September 06, 1985, PCP Muse, Noel Journey., PA-C   Past Medical History:  Diagnosis Date  . Arthritis    lower back, right leg  . Back pain   . Cerebrospinal fluid leak from spinal puncture 03/03/2016   after epidural steroid injection; states has had 2 blood patch procedures   . Difficulty swallowing solids    certain foods  . Hyperthyroidism   . Migraines   . Nasal congestion 12/06/2016  . Neck pain of over 3 months duration    posterior and right side  . Neuropathy    right leg  . Restless leg syndrome   . Thyroid nodule 11/2016   right  . Tracheal deviation    due to thyroid nodule, per pt.  . Urinary frequency    Past Surgical History:  Procedure Laterality Date  . DG MYLEOGRAM LUMBAR SPINE ([facility])    . DILATION AND CURETTAGE OF UTERUS  06/25/2002  . THYROIDECTOMY Right 12/13/2016   Procedure: RIGHT HEMI-THYROIDECTOMY;  Surgeon: Leta Baptist, MD;  Location: [facility];  Service: ENT;  Laterality: Right;  . TONSILLECTOMY     Social History   Social History  . Marital status: Married    Spouse name: Psychiatrist   . Number of children: 3  . Years of education: 4 Bachelor   Occupational History  . 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 Smoking status: Never Smoker  . Smokeless tobacco: Never Used  . Alcohol use No  . Drug use: No  . Sexual activity: Yes    Birth control/ protection: Implant   Other Topics Concern  . None   Social History Narrative   Patient lives with her fiance and 3 children.    Her fiance is the father of her current pregnancy, other three children were fathered by her previous partner.    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     Caffeine use: none   Right-handed   Outpatient Encounter  Prescriptions as of 03/16/2017  Medication Sig  . baclofen (LIORESAL) 10 MG tablet Take 10 mg by mouth 3 (three) times daily as needed for muscle spasms.  Marland Kitchen etonogestrel (NEXPLANON) 68 MG IMPL implant 1 each by Subdermal route once.  Marland Kitchen FLUoxetine (PROZAC) 20 MG capsule Take 1 capsule (20 mg total) by mouth daily.  Marland Kitchen gabapentin (NEURONTIN) 300 MG capsule Take 1 capsule (300 mg total) by mouth 3 (three) times daily.   Facility-Administered Encounter Medications as of 03/16/2017  Medication  . triamcinolone acetonide (KENALOG) 10 MG/ML injection 10 mg   ALLERGIES: No Known Allergies  VACCINATION STATUS: Immunization History  Administered Date(s) Administered  . Influenza,inj,Quad PF,36+ Mos 05/21/2015  . Tdap 06/21/2015    HPI 31 year old female patient with medical history as above. She is being seen in consultation for hyperthyroidism requested by Dr. Benjamine Mola. -Her history includes recent diagnosis of goiter status post right hemithyroidectomy on 12/13/2016 with benign outcomes. -She did have suppressed TSH of 0.089 along with normal free T3 and free T4 in February 2018. More recently on January 28, 2017 her TSH was found to be 0.35, no associated free T4.  -She complains of fatigue , excessive sleep. She denies palpitations, heat intolerance. She has more or less  stable weight. -She gives history of CSF leak which is being worked up by her neurologist. She has associated headaches on and off. -She denies any family history of thyroid dysfunction nor thyroid cancer. - She is a mother of 4 kids age  Range 87-13. She is not currently on any antithyroid medications nor thyroid hormone replacement.  Review of Systems  Constitutional: no weight gain/loss, + fatigue, no subjective hyperthermia, no subjective hypothermia Eyes: no blurry vision, no xerophthalmia ENT: no sore throat, + nodules palpated in throat status post right hemithyroidectomy in April 2018, no dysphagia/odynophagia, no  hoarseness Cardiovascular: no Chest Pain, no Shortness of Breath, no palpitations, no leg swelling Respiratory: no cough, no SOB Gastrointestinal: no Nausea/Vomiting/Diarhhea Musculoskeletal: no muscle/joint aches Skin: no rashes Neurological: no tremors, no numbness, no tingling, no dizziness Psychiatric: no depression, no anxiety  Objective:    BP 121/72   Pulse 80   Ht 5\' 9"  (1.753 m)   Wt 172 lb (78 kg)   BMI 25.40 kg/m   Wt Readings from Last 3 Encounters:  03/16/17 172 lb (78 kg)  03/14/17 172 lb 14.4 oz (78.4 kg)  12/13/16 169 lb 12.8 oz (77 kg)    Physical Exam  Constitutional: Appropriate weight for height, not in acute distress, normal state of mind Eyes: PERRLA, EOMI, no exophthalmos ENT: moist mucous membranes, + healed scar on lower neck from thyroidectomy, - thyromegaly, no cervical lymphadenopathy Cardiovascular: normal precordial activity, Regular Rate and Rhythm, no Murmur/Rubs/Gallops Respiratory:  adequate breathing efforts, no gross chest deformity, Clear to auscultation bilaterally Gastrointestinal: abdomen soft, Non -tender, No distension, Bowel Sounds present Musculoskeletal: no gross deformities, strength intact in all four extremities Skin: moist, warm, no rashes Neurological: no tremor with outstretched hands, Deep tendon reflexes normal in all four extremities.  CMP ( most recent) CMP     Component Value Date/Time   NA 138 10/04/2016 0024   K 3.3 (L) 10/04/2016 0024   CL 104 10/04/2016 0024   CO2 26 10/04/2016 0024   GLUCOSE 86 10/04/2016 0024   BUN 9 10/04/2016 0024   CREATININE 0.74 10/04/2016 0024   CALCIUM 8.8 (L) 10/04/2016 0024   PROT 8.1 10/18/2015 2135   ALBUMIN 4.6 10/18/2015 2135   AST 18 10/18/2015 2135   ALT 16 10/18/2015 2135   ALKPHOS 67 10/18/2015 2135   BILITOT 0.4 10/18/2015 2135   GFRNONAA >60 10/04/2016 0024   GFRAA >60 10/04/2016 0024   Recent Results (from the past 2160 hour(s))  TSH     Status: Abnormal    Collection Time: 01/28/17 12:00 AM  Result Value Ref Range   TSH 0.35 (A) 0.41 - 5.90  Ferritin     Status: None   Collection Time: 03/14/17 10:31 AM  Result Value Ref Range   Ferritin 98 15 - 150 ng/mL  Vitamin D, 25-hydroxy     Status: Abnormal   Collection Time: 03/14/17 10:31 AM  Result Value Ref Range   Vit D, 25-Hydroxy 18.4 (L) 30.0 - 100.0 ng/mL    Comment: Vitamin D deficiency has been defined by the [place] and an Endocrine Society practice guideline as a level of serum 25-OH vitamin D less than 20 ng/mL (1,2). The Endocrine Society went on to further define vitamin D insufficiency as a level between 21 and 29 ng/mL (2). 1. IOM (Institute of Medicine). 2010. Dietary reference    intakes for calcium and D. [place]: The    [employer]. 2. Holick MF, Binkley  Garza, Bischoff-Ferrari HA, et al.    Evaluation, treatment, and prevention of vitamin D    deficiency: an Endocrine Society clinical practice    guideline. JCEM. 2011 Jul; 96(7):1911-30.   Vitamin B12     Status: None   Collection Time: 03/14/17 10:31 AM  Result Value Ref Range   Vitamin B-12 657 232 - 1,245 pg/mL  Methylmalonic acid, serum     Status: None (Preliminary result)   Collection Time: 03/14/17 10:31 AM  Result Value Ref Range   Methylmalonic Acid WILL FOLLOW    Disclaimer: Comment     Comment: This test was developed and its performance characteristics determined by [facility]. It has not been cleared or approved by the Food and Drug Administration.     Surgical sample of right hemithyroidectomy from 12/13/2016 showed a 5 cm nodule removed along with her right lobe of the thyroid which was 5.3 cm on the longest dimension. Pathology was negative for malignancy.   Assessment & Plan:   1. Subclinical hyperthyroidism - I have reviewed her available thyroid records and clinically evaluated this patient. Based on her use she has subclinical hyperthyroidism with TSH of 0.25 on  01/28/2017. She is also status post right hemithyroidectomy on 12/13/2016 with benign outcomes.  - Her symptoms and signs complexes are nonspecific for thyroid dysfunction. - She will need more complete thyroid function tests including TSH, free T4 and free T3 along with antithyroid antibodies. - She will return in 1 week for treatment decision if necessary. -I did not initiate any prescription today. - She may need repeat thyroid/neck sonogram in 2 years.  - I advised patient to maintain close follow up with Raiford Simmonds., PA-C for primary care needs. Follow up plan: Return in about 1 week (around 03/23/2017) for labs today.  Glade Lloyd, MD Phone: [phone]  Fax: [phone]   03/16/2017, 1:20 PM

## 2017-03-17 ENCOUNTER — Telehealth: Payer: Self-pay | Admitting: *Deleted

## 2017-03-17 LAB — THYROID PEROXIDASE ANTIBODY: Thyroperoxidase Ab SerPl-aCnc: <1 IU/mL (ref <9)

## 2017-03-17 LAB — THYROGLOBULIN ANTIBODY: Thyroglobulin Ab: <1 IU/mL (ref <2)

## 2017-03-17 NOTE — Telephone Encounter (Signed)
Spoke with patient and informed her that her labs are fine except her vitamin D is very low. Advised her that Dr Jaynee Eagles recommends taking OTC vitamin D every day 1000-2000 mg daily. Patient questioned what could cause her vit D to be low; advised not enough time outdoors in the sun, not eating/drinking dairy products. Patient then stated Gabapentin makes her very sleepy. She is now on vacation so stated it doesn't really matter.  This RN advised it may need more time for her body to adjust. Advised she call back if it continues to make her sleepy; schedule of taking medication may be adjusted per Dr Jaynee Eagles to avoid sleepiness in daytime. She verbalized understanding,appreciation of call.

## 2017-03-18 LAB — METHYLMALONIC ACID, SERUM: Methylmalonic Acid: 95 nmol/L (ref 0–378)

## 2017-03-18 LAB — VITAMIN B12: VITAMIN B 12: 657 pg/mL (ref 232–1245)

## 2017-03-18 LAB — FERRITIN: FERRITIN: 98 ng/mL (ref 15–150)

## 2017-03-18 LAB — VITAMIN D 25 HYDROXY (VIT D DEFICIENCY, FRACTURES): VIT D 25 HYDROXY: 18.4 ng/mL — AB (ref 30.0–100.0)

## 2017-03-20 ENCOUNTER — Ambulatory Visit
Admission: RE | Admit: 2017-03-20 | Discharge: 2017-03-20 | Disposition: A | Discharge status: Home / Self Care | Payer: Medicaid Other | Source: Ambulatory Visit | Attending: Neurology | Admitting: Neurology

## 2017-03-20 DIAGNOSIS — R51 Headache with orthostatic component, not elsewhere classified: Secondary | ICD-10-CM

## 2017-03-20 DIAGNOSIS — G9600 Cerebrospinal fluid leak, unspecified: Secondary | ICD-10-CM

## 2017-03-20 DIAGNOSIS — G96810 Intracranial hypotension, unspecified: Secondary | ICD-10-CM

## 2017-03-20 DIAGNOSIS — H539 Unspecified visual disturbance: Secondary | ICD-10-CM

## 2017-03-20 DIAGNOSIS — G9389 Other specified disorders of brain: Principal | ICD-10-CM

## 2017-03-20 DIAGNOSIS — R42 Dizziness and giddiness: Secondary | ICD-10-CM

## 2017-03-20 MED ORDER — GADOBENATE DIMEGLUMINE 529 MG/ML IV SOLN
16.0000 mL | Freq: Once | INTRAVENOUS | Status: AC | PRN
  Administered 2017-03-20: 16 mL via INTRAVENOUS

## 2017-03-22 ENCOUNTER — Telehealth: Payer: Self-pay

## 2017-03-22 NOTE — Telephone Encounter (Signed)
-----   Message from Melvenia Beam, MD sent at 03/21/2017  8:08 PM EDT ----- Mri brain normal

## 2017-03-22 NOTE — Telephone Encounter (Signed)
I called Linda Reid and advised her that Dr. Jaynee Eagles reviewed her MRI brain and found that it is normal. I advised Linda Reid to please keep her NCV/EMG appt later this week. Linda Reid verbalized understanding of results. Linda Reid had no questions at this time but was encouraged to call back if questions arise.

## 2017-03-23 ENCOUNTER — Ambulatory Visit: Payer: Medicaid Other | Admitting: "Endocrinology

## 2017-03-23 ENCOUNTER — Encounter: Payer: Self-pay | Admitting: "Endocrinology

## 2017-03-23 ENCOUNTER — Ambulatory Visit (INDEPENDENT_AMBULATORY_CARE_PROVIDER_SITE_OTHER): Payer: Medicaid Other | Admitting: "Endocrinology

## 2017-03-23 ENCOUNTER — Ambulatory Visit (INDEPENDENT_AMBULATORY_CARE_PROVIDER_SITE_OTHER): Payer: Medicaid Other | Admitting: Psychology

## 2017-03-23 VITALS — BP 120/77 | HR 82 | Ht 69.0 in | Wt 172.0 lb

## 2017-03-23 DIAGNOSIS — E876 Hypokalemia: Secondary | ICD-10-CM

## 2017-03-23 DIAGNOSIS — E0590 Thyrotoxicosis, unspecified without thyrotoxic crisis or storm: Secondary | ICD-10-CM

## 2017-03-23 DIAGNOSIS — F321 Major depressive disorder, single episode, moderate: Secondary | ICD-10-CM | POA: Diagnosis not present

## 2017-03-23 MED ORDER — POTASSIUM CHLORIDE ER 20 MEQ PO TBCR: 1.0000 | EXTENDED_RELEASE_TABLET | Freq: Every day | ORAL | 0 refills | Status: DC

## 2017-03-23 MED ORDER — CALCIUM CARBONATE 1250 (500 CA) MG PO TABS: 1.0000 | ORAL_TABLET | Freq: Three times a day (TID) | ORAL | 0 refills | Status: DC

## 2017-03-23 NOTE — Progress Notes (Signed)
Subjective:    Patient ID: Linda Reid, female    DOB: [birth year], PCP Muse, Noel Journey., PA-C   Past Medical History:  Diagnosis Date  . Arthritis    lower back, right leg  . Back pain   . Cerebrospinal fluid leak from spinal puncture 03/03/2016   after epidural steroid injection; states has had 2 blood patch procedures   . Difficulty swallowing solids    certain foods  . Hyperthyroidism   . Migraines   . Nasal congestion 12/06/2016  . Neck pain of over 3 months duration    posterior and right side  . Neuropathy    right leg  . Restless leg syndrome   . Thyroid nodule 11/2016   right  . Tracheal deviation    due to thyroid nodule, per pt.  . Urinary frequency    Past Surgical History:  Procedure Laterality Date  . DG MYLEOGRAM LUMBAR SPINE ([facility] HX)    . DILATION AND CURETTAGE OF UTERUS  06/25/2002  . THYROIDECTOMY Right 12/13/2016   Procedure: RIGHT HEMI-THYROIDECTOMY;  Surgeon: Leta Baptist, MD;  Location: [facility];  Service: ENT;  Laterality: Right;  . TONSILLECTOMY     Social History   Social History  . Marital status: Married    Spouse name: Psychiatrist   . Number of children: 3  . Years of education: 4 Bachelor   Occupational History  . 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 Smoking status: Never Smoker  . Smokeless tobacco: Never Used  . Alcohol use No  . Drug use: No  . Sexual activity: Yes    Birth control/ protection: Implant   Other Topics Concern  . None   Social History Narrative   Patient lives with her fiance and 3 children.    Her fiance is the father of her current pregnancy, other three children were fathered by her previous partner.    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     Caffeine use: none   Right-handed   Outpatient Encounter  Prescriptions as of 03/23/2017  Medication Sig  . baclofen (LIORESAL) 10 MG tablet Take 10 mg by mouth 3 (three) times daily as needed for muscle spasms.  . calcium carbonate (OS-CAL - DOSED IN MG OF ELEMENTAL CALCIUM) 1250 (500 Ca) MG tablet Take 1 tablet (500 mg of elemental calcium total) by mouth 3 (three) times daily with meals.  Marland Kitchen etonogestrel (NEXPLANON) 68 MG IMPL implant 1 each by Subdermal route once.  Marland Kitchen FLUoxetine (PROZAC) 20 MG capsule Take 1 capsule (20 mg total) by mouth daily.  Marland Kitchen gabapentin (NEURONTIN) 300 MG capsule Take 1 capsule (300 mg total) by mouth 3 (three) times daily.  . Potassium Chloride ER 20 MEQ TBCR Take 1 tablet by mouth daily with breakfast.   Facility-Administered Encounter Medications as of 03/23/2017  Medication  . triamcinolone acetonide (KENALOG) 10 MG/ML injection 10 mg   ALLERGIES: No Known Allergies  VACCINATION STATUS: Immunization History  Administered Date(s) Administered  . Influenza,inj,Quad PF,36+ Mos 05/21/2015  . Tdap 06/21/2015    HPI 31 year old female patient with medical history as above. She is being seen in f/u for subclinical hyperthyroidism. - She had repeat thyroid function tests since last visit. -Her history includes recent diagnosis of goiter status post right hemithyroidectomy on  12/13/2016 with benign outcomes. -She did have suppressed TSH of 0.089 along with normal free T3 and free T4 in February 2018. More recently on January 28, 2017 her TSH was found to be 0.35, no associated free T4.  -She complains of fatigue , excessive sleep. She denies palpitations, heat intolerance. She has more or less stable weight, but complains of muscle cramps. - She is being worked up with neurology in relation to her recent history of CSF leak . She has associated headaches on and off. -She denies any family history of thyroid dysfunction nor thyroid cancer. - She is a mother of 4 kids age  Range 11-13. She is not currently on any antithyroid  medications nor thyroid hormone replacement.  Review of Systems  Constitutional: no weight gain/loss, + fatigue, no subjective hyperthermia, no subjective hypothermia Eyes: no blurry vision, no xerophthalmia ENT: no sore throat, + nodules palpated in throat status post right hemithyroidectomy in April 2018, no dysphagia/odynophagia, no hoarseness Cardiovascular: no Chest Pain, no Shortness of Breath, no palpitations, no leg swelling Respiratory: no cough, no SOB Gastrointestinal: no Nausea/Vomiting/Diarhhea Musculoskeletal: no muscle/joint aches Skin: no rashes Neurological: no tremors, no numbness, no tingling, no dizziness Psychiatric: no depression, no anxiety  Objective:    BP 120/77   Pulse 82   Ht 5\' 9"  (1.753 m)   Wt 172 lb (78 kg)   BMI 25.40 kg/m   Wt Readings from Last 3 Encounters:  03/23/17 172 lb (78 kg)  03/16/17 172 lb (78 kg)  03/14/17 172 lb 14.4 oz (78.4 kg)    Physical Exam  Constitutional: Appropriate weight for height, not in acute distress, normal state of mind Eyes: PERRLA, EOMI, no exophthalmos ENT: moist mucous membranes, + healed scar on lower neck from thyroidectomy, - thyromegaly, no cervical lymphadenopathy Cardiovascular: normal precordial activity, Regular Rate and Rhythm, no Murmur/Rubs/Gallops Respiratory:  adequate breathing efforts, no gross chest deformity, Clear to auscultation bilaterally Gastrointestinal: abdomen soft, Non -tender, No distension, Bowel Sounds present Musculoskeletal: no gross deformities, strength intact in all four extremities Skin: moist, warm, no rashes Neurological: no tremor with outstretched hands, Deep tendon reflexes normal in all four extremities.  CMP ( most recent) CMP     Component Value Date/Time   NA 138 10/04/2016 0024   K 3.3 (L) 10/04/2016 0024   CL 104 10/04/2016 0024   CO2 26 10/04/2016 0024   GLUCOSE 86 10/04/2016 0024   BUN 9 10/04/2016 0024   CREATININE 0.74 10/04/2016 0024   CALCIUM 8.8  (L) 10/04/2016 0024   PROT 8.1 10/18/2015 2135   ALBUMIN 4.6 10/18/2015 2135   AST 18 10/18/2015 2135   ALT 16 10/18/2015 2135   ALKPHOS 67 10/18/2015 2135   BILITOT 0.4 10/18/2015 2135   GFRNONAA >60 10/04/2016 0024   GFRAA >60 10/04/2016 0024   Recent Results (from the past 2160 hour(s))  TSH     Status: Abnormal   Collection Time: 01/28/17 12:00 AM  Result Value Ref Range   TSH 0.35 (A) 0.41 - 5.90  Ferritin     Status: None   Collection Time: 03/14/17 10:31 AM  Result Value Ref Range   Ferritin 98 15 - 150 ng/mL  Vitamin D, 25-hydroxy     Status: Abnormal   Collection Time: 03/14/17 10:31 AM  Result Value Ref Range   Vit D, 25-Hydroxy 18.4 (L) 30.0 - 100.0 ng/mL    Comment: Vitamin D deficiency has been defined by the [place]  practice guideline as a level of serum 25-OH vitamin D less than 20 ng/mL (1,2). The Endocrine Society went on to further define vitamin D insufficiency as a level between 21 and 29 ng/mL (2). 1. IOM (Institute of Medicine). 2010. Dietary reference    intakes for calcium and D. [place]: The    [employer]. 2. Holick MF, Binkley [place], Bischoff-Ferrari HA, et al.    Evaluation, treatment, and prevention of vitamin D    deficiency: an Endocrine Society clinical practice    guideline. JCEM. 2011 Jul; 96(7):1911-30.   Vitamin B12     Status: None   Collection Time: 03/14/17 10:31 AM  Result Value Ref Range   Vitamin B-12 657 232 - 1,245 pg/mL  Methylmalonic acid, serum     Status: None   Collection Time: 03/14/17 10:31 AM  Result Value Ref Range   Methylmalonic Acid 95 0 - 378 nmol/L   Disclaimer: Comment     Comment: This test was developed and its performance characteristics determined by [facility]. It has not been cleared or approved by the Food and Drug Administration.   Thyroglobulin antibody     Status: None   Collection Time: 03/16/17 10:29 AM  Result Value Ref Range   Thyroglobulin  Ab <1 <2 IU/mL  Thyroid peroxidase antibody     Status: None   Collection Time: 03/16/17 10:29 AM  Result Value Ref Range   Thyroperoxidase Ab SerPl-aCnc <1 <9 IU/mL  TSH     Status: None   Collection Time: 03/16/17 10:29 AM  Result Value Ref Range   TSH 0.57 mIU/L    Comment:   Reference Range   > or = 20 Years  0.40-4.50   Pregnancy Range First trimester  0.26-2.66 Second trimester 0.55-2.73 Third trimester  0.43-2.91     T4, free     Status: None   Collection Time: 03/16/17 10:29 AM  Result Value Ref Range   Free T4 1.0 0.8 - 1.8 ng/dL  T3, free     Status: None   Collection Time: 03/16/17 10:29 AM  Result Value Ref Range   T3, Free 2.8 2.3 - 4.2 pg/mL    Surgical sample of right hemithyroidectomy from 12/13/2016 showed a 5 cm nodule removed along with her right lobe of the thyroid which was 5.3 cm on the longest dimension. Pathology was negative for malignancy.   Assessment & Plan:   1. Subclinical hyperthyroidism -  Her repeat thyroid function tests are all within normal limits. She will not require intervention at this time.  She is also status post right hemithyroidectomy on 12/13/2016 with benign outcomes, she will need surveillance thyroid ultrasound in 1 year. - Her symptoms and signs complexes are nonspecific for thyroid dysfunction. Review of her labs showed mild hypokalemia and hypocalcemia. - She may benefit from supplement with potassium and calcium for short-term, hence I'm prescribing potassium chloride 20 mEq daily for 30 days and calcium carbonate 1250 mg 3 times a day before meals for 30 days - no refills - with repeat CMP.  - I advised patient to maintain close follow up with Raiford Simmonds., PA-C for primary care needs. Follow up plan: Return in about 4 weeks (around 04/20/2017) for follow up with pre-visit labs.  Glade Lloyd, MD Phone: [phone]  Fax: [phone]   03/23/2017, 12:01 PM

## 2017-03-24 ENCOUNTER — Ambulatory Visit (INDEPENDENT_AMBULATORY_CARE_PROVIDER_SITE_OTHER): Payer: Self-pay | Admitting: Neurology

## 2017-03-24 ENCOUNTER — Ambulatory Visit (INDEPENDENT_AMBULATORY_CARE_PROVIDER_SITE_OTHER): Payer: Medicaid Other | Admitting: Neurology

## 2017-03-24 DIAGNOSIS — M5416 Radiculopathy, lumbar region: Secondary | ICD-10-CM

## 2017-03-24 DIAGNOSIS — M79604 Pain in right leg: Secondary | ICD-10-CM | POA: Diagnosis not present

## 2017-03-24 DIAGNOSIS — Z0289 Encounter for other administrative examinations: Secondary | ICD-10-CM

## 2017-03-24 NOTE — Procedures (Signed)
        Full Name: Linda Reid Gender: Female MRN #: [id number] Date of Birth: December 01, 1985    Visit Date: 03/24/2017 09:44 Age: 31 Years 0 Months Old Examining Physician: Sarina Ill, MD  Referring Physician: Jaynee Eagles, MD    History: Patient is here for evaluation of right leg radiculopathy.  Summery: All muscles and nerves (as detailed in the following tables) were within normal limits  Conclusion: This is a normal study of the right lower extremity.   Sarina Ill M.D.  [facility] [place], [place] [address] Tel: [phone] Fax: [phone]        [facility]    Nerve / Sites Muscle Latency Ref. Amplitude Ref. Rel Amp Segments Distance Velocity Ref. Area    ms ms mV mV %  cm m/s m/s mVms  R Peroneal - EDB     Ankle EDB 4.9 ?6.5 5.5 ?2.0 100 Ankle - EDB 9   17.5     Fib head EDB 11.0  5.3  96.3 Fib head - Ankle 31 51 ?44 17.0     Pop fossa EDB 13.0  5.0  94.4 Pop fossa - Fib head 10 51 ?44 16.1         Pop fossa - Ankle      R Tibial - AH     Ankle AH 4.2 ?5.8 8.5 ?4.0 100 Ankle - AH 9   28.6     Pop fossa AH 12.4  6.7  78.8 Pop fossa - Ankle 36 44 ?41 26.4         SNC    Nerve / Sites Rec. Site Peak Lat Ref.  Amp Ref. Segments Distance    ms ms V V  cm  R Sural - Ankle (Calf)     Calf Ankle 3.6 ?4.4 12 ?6 Calf - Ankle 14  R Superficial peroneal - Ankle     Lat leg Ankle 4.0 ?4.4 10 ?6 Lat leg - Ankle 14         F  Wave    Nerve F Lat Ref.   ms ms  R Tibial - AH 49.7 ?56.0       H Reflex    Nerve H Lat Lat Hmax   ms ms   Left Right Ref. Left Right Ref.  Tibial - Soleus 28.8 28.4 ?35.0 28.9 29.0 ?35.0         EMG full       EMG Summary Table    Spontaneous MUAP Recruitment  Muscle IA Fib PSW Fasc Other Amp Dur. Poly Pattern  R. Vastus medialis Normal None None None _______ Normal Normal Normal Normal  R. Tibialis anterior Normal None None None _______ Normal Normal Normal Normal  R. Gastrocnemius (Medial head) Normal None  None None _______ Normal Normal Normal Normal  R. Biceps femoris (long head) Normal None None None _______ Normal Normal Normal Normal  R. Gluteus maximus Normal None None None _______ Normal Normal Normal Normal  R. Gluteus medius Normal None None None _______ Normal Normal Normal Normal  R. Lumbar paraspinals (low) Normal None None None _______ Normal Normal Normal Normal

## 2017-03-24 NOTE — Patient Instructions (Signed)
Myofascial Pain Syndrome and Fibromyalgia Myofascial pain syndrome and fibromyalgia are both pain disorders. This pain may be felt mainly in your muscles.  Myofascial pain syndrome: ? Always has trigger points or tender points in the muscle that will cause pain when pressed. The pain may come and go. ? Usually affects your neck, upper back, and shoulder areas. The pain often radiates into your arms and hands.  Fibromyalgia: ? Has muscle pains and tenderness that come and go. ? Is often associated with fatigue and sleep disturbances. ? Has trigger points. ? Tends to be long-lasting (chronic), but is not life-threatening.  Fibromyalgia and myofascial pain are not the same. However, they often occur together. If you have both conditions, each can make the other worse. Both are common and can cause enough pain and fatigue to make day-to-day activities difficult. What are the causes? The exact causes of fibromyalgia and myofascial pain are not known. People with certain gene types may be more likely to develop fibromyalgia. Some factors can be triggers for both conditions, such as:  Spine disorders.  Arthritis.  Severe injury (trauma) and other physical stressors.  Being under a lot of stress.  A medical illness.  What are the signs or symptoms? Fibromyalgia The main symptom of fibromyalgia is widespread pain and tenderness in your muscles. This can vary over time. Pain is sometimes described as stabbing, shooting, or burning. You may have tingling or numbness, too. You may also have sleep problems and fatigue. You may wake up feeling tired and groggy (fibro fog). Other symptoms may include:  Bowel and bladder problems.  Headaches.  Visual problems.  Problems with odors and noises.  Depression or mood changes.  Painful menstrual periods (dysmenorrhea).  Dry skin or eyes.  Myofascial pain syndrome Symptoms of myofascial pain syndrome include:  Tight, ropy bands of  muscle.  Uncomfortable sensations in muscular areas, such as: ? Aching. ? Cramping. ? Burning. ? Numbness. ? Tingling. ? Muscle weakness.  Trouble moving certain muscles freely (range of motion).  How is this diagnosed? There are no specific tests to diagnose fibromyalgia or myofascial pain syndrome. Both can be hard to diagnose because their symptoms are common in many other conditions. Your health care provider may suspect one or both of these conditions based on your symptoms and medical history. Your health care provider will also do a physical exam. The key to diagnosing fibromyalgia is having pain, fatigue, and other symptoms for more than three months that cannot be explained by another condition. The key to diagnosing myofascial pain syndrome is finding trigger points in muscles that are tender and cause pain elsewhere in your body (referred pain). How is this treated? Treating fibromyalgia and myofascial pain often requires a team of health care providers. This usually starts with your primary provider and a physical therapist. You may also find it helpful to work with alternative health care providers, such as massage therapists or acupuncturists. Treatment for fibromyalgia may include medicines. This may include nonsteroidal anti-inflammatory drugs (NSAIDs), along with other medicines. Treatment for myofascial pain may also include:  NSAIDs.  Cooling and stretching of muscles.  Trigger point injections.  Sound wave (ultrasound) treatments to stimulate muscles.  Follow these instructions at home:  Take medicines only as directed by your health care provider.  Exercise as directed by your health care provider or physical therapist.  Try to avoid stressful situations.  Practice relaxation techniques to control your stress. You may want to try: ? Biofeedback. ? Visual   imagery. ? Hypnosis. ? Muscle relaxation. ? Yoga. ? Meditation.  Talk to your health care provider  about alternative treatments, such as acupuncture or massage treatment.  Maintain a healthy lifestyle. This includes eating a healthy diet and getting enough sleep.  Consider joining a support group.  Do not do activities that stress or strain your muscles. That includes repetitive motions and heavy lifting. Where to find more information:  National Fibromyalgia Association: www.fmaware.org  Arthritis Foundation: www.arthritis.org  American Chronic Pain Association: www.theacpa.org/condition/myofascial-pain Contact a health care provider if:  You have new symptoms.  Your symptoms get worse.  You have side effects from your medicines.  You have trouble sleeping.  Your condition is causing depression or anxiety. This information is not intended to replace advice given to you by your health care provider. Make sure you discuss any questions you have with your health care provider. Document Released: 08/09/2005 Document Revised: 01/15/2016 Document Reviewed: 05/15/2014 Elsevier Interactive Patient Education  2018 Elsevier Inc.  

## 2017-03-24 NOTE — Progress Notes (Signed)
See procedure note.

## 2017-03-24 NOTE — Progress Notes (Signed)
        Full Name: Linda Reid Gender: Female MRN #: [id number] Date of Birth: Dec 17, 1985    Visit Date: 03/24/2017 09:44 Age: 31 Years 0 Months Old Examining Physician: Sarina Ill, MD  Referring Physician: Jaynee Eagles, MD    History: Patient is here for evaluation of right leg radiculopathy.  Summery: All muscles and nerves (as detailed in the following tables) were within normal limits  Conclusion: This is a normal study of the right lower extremity.   Sarina Ill M.D.  [facility] [place], [place] [address] Tel: [phone] Fax: [phone]        [facility]    Nerve / Sites Muscle Latency Ref. Amplitude Ref. Rel Amp Segments Distance Velocity Ref. Area    ms ms mV mV %  cm m/s m/s mVms  R Peroneal - EDB     Ankle EDB 4.9 ?6.5 5.5 ?2.0 100 Ankle - EDB 9   17.5     Fib head EDB 11.0  5.3  96.3 Fib head - Ankle 31 51 ?44 17.0     Pop fossa EDB 13.0  5.0  94.4 Pop fossa - Fib head 10 51 ?44 16.1         Pop fossa - Ankle      R Tibial - AH     Ankle AH 4.2 ?5.8 8.5 ?4.0 100 Ankle - AH 9   28.6     Pop fossa AH 12.4  6.7  78.8 Pop fossa - Ankle 36 44 ?41 26.4         SNC    Nerve / Sites Rec. Site Peak Lat Ref.  Amp Ref. Segments Distance    ms ms V V  cm  R Sural - Ankle (Calf)     Calf Ankle 3.6 ?4.4 12 ?6 Calf - Ankle 14  R Superficial peroneal - Ankle     Lat leg Ankle 4.0 ?4.4 10 ?6 Lat leg - Ankle 14         F  Wave    Nerve F Lat Ref.   ms ms  R Tibial - AH 49.7 ?56.0       H Reflex    Nerve H Lat Lat Hmax   ms ms   Left Right Ref. Left Right Ref.  Tibial - Soleus 28.8 28.4 ?35.0 28.9 29.0 ?35.0         EMG full       EMG Summary Table    Spontaneous MUAP Recruitment  Muscle IA Fib PSW Fasc Other Amp Dur. Poly Pattern  R. Vastus medialis Normal None None None _______ Normal Normal Normal Normal  R. Tibialis anterior Normal None None None _______ Normal Normal Normal Normal  R. Gastrocnemius (Medial head) Normal None  None None _______ Normal Normal Normal Normal  R. Biceps femoris (long head) Normal None None None _______ Normal Normal Normal Normal  R. Gluteus maximus Normal None None None _______ Normal Normal Normal Normal  R. Gluteus medius Normal None None None _______ Normal Normal Normal Normal  R. Lumbar paraspinals (low) Normal None None None _______ Normal Normal Normal Normal

## 2017-03-26 LAB — PAN-ANCA
ANCA Proteinase 3: <3.5 U/mL (ref 0.0–3.5)
Myeloperoxidase Ab: <9 U/mL (ref 0.0–9.0)
P-ANCA: <1:20 {titer}

## 2017-03-29 LAB — SJOGREN'S SYNDROME ANTIBODS(SSA + SSB)
ENA SSA (RO) Ab: <0.2 AI (ref 0.0–0.9)
ENA SSB (LA) Ab: <0.2 AI (ref 0.0–0.9)

## 2017-03-29 LAB — VITAMIN B6: Vitamin B6: 6.8 ug/L (ref 2.0–32.8)

## 2017-03-29 LAB — B. BURGDORFI ANTIBODIES: Lyme IgG/IgM Ab: <0.91 {ISR} (ref 0.00–0.90)

## 2017-03-29 LAB — RHEUMATOID FACTOR: Rheumatoid fact SerPl-aCnc: <10 IU/mL (ref 0.0–13.9)

## 2017-03-29 LAB — ANTINUCLEAR ANTIBODIES, IFA: ANA TITER 1: NEGATIVE

## 2017-03-29 LAB — HEAVY METALS, BLOOD
Arsenic: 8 ug/L (ref 2–23)
LEAD, BLOOD: NOT DETECTED ug/dL (ref 0–19)
Mercury: NOT DETECTED ug/L (ref 0.0–14.9)

## 2017-03-29 LAB — VITAMIN B1: Thiamine: 109.6 nmol/L (ref 66.5–200.0)

## 2017-03-30 ENCOUNTER — Ambulatory Visit: Payer: Medicaid Other | Admitting: Psychology

## 2017-04-05 ENCOUNTER — Telehealth: Payer: Self-pay

## 2017-04-05 NOTE — Telephone Encounter (Signed)
-----   Message from Melvenia Beam, MD sent at 03/29/2017 10:56 AM EDT ----- All labs normal

## 2017-04-05 NOTE — Telephone Encounter (Signed)
Called pt w/ normal lab results. Verbalized understanding and asked that a message be relayed to Dr. Jaynee Eagles. Linda Reid that she has 3 aunts that have similar symptoms with 2 of them diagnosed with hereditary neuropathy in the same leg. Voiced appreciation for call.

## 2017-04-06 ENCOUNTER — Ambulatory Visit (INDEPENDENT_AMBULATORY_CARE_PROVIDER_SITE_OTHER): Payer: Medicaid Other | Admitting: Psychology

## 2017-04-06 DIAGNOSIS — F321 Major depressive disorder, single episode, moderate: Secondary | ICD-10-CM

## 2017-04-13 ENCOUNTER — Ambulatory Visit: Payer: Medicaid Other | Admitting: Psychology

## 2017-04-20 ENCOUNTER — Ambulatory Visit: Payer: Self-pay | Admitting: Psychology

## 2017-04-20 ENCOUNTER — Ambulatory Visit: Payer: Medicaid Other | Admitting: "Endocrinology

## 2017-04-20 ENCOUNTER — Other Ambulatory Visit: Payer: Self-pay | Admitting: "Endocrinology

## 2017-04-20 LAB — RENAL FUNCTION PANEL
ALBUMIN: 4.2 g/dL (ref 3.6–5.1)
BUN: 10 mg/dL (ref 7–25)
CO2: 25 mmol/L (ref 20–32)
CREATININE: 0.9 mg/dL (ref 0.50–1.10)
Calcium: 8.6 mg/dL (ref 8.6–10.2)
Chloride: 107 mmol/L (ref 98–110)
GLUCOSE: 86 mg/dL (ref 65–99)
Phosphorus: 2.6 mg/dL (ref 2.5–4.5)
Potassium: 3.8 mmol/L (ref 3.5–5.3)
Sodium: 140 mmol/L (ref 135–146)

## 2017-04-24 ENCOUNTER — Encounter: Payer: Self-pay | Admitting: Neurology

## 2017-04-26 ENCOUNTER — Other Ambulatory Visit: Payer: Self-pay | Admitting: Neurology

## 2017-04-26 MED ORDER — TIZANIDINE HCL 4 MG PO TABS: 4.0000 mg | ORAL_TABLET | Freq: Four times a day (QID) | ORAL | 0 refills | Status: DC | PRN

## 2017-04-27 ENCOUNTER — Ambulatory Visit: Payer: Medicaid Other | Admitting: Psychology

## 2017-05-04 ENCOUNTER — Ambulatory Visit: Payer: Medicaid Other | Admitting: Psychology

## 2017-05-10 ENCOUNTER — Encounter: Payer: Self-pay | Admitting: "Endocrinology

## 2017-05-10 ENCOUNTER — Ambulatory Visit: Payer: Medicaid Other | Admitting: "Endocrinology

## 2017-05-11 ENCOUNTER — Ambulatory Visit: Payer: Medicaid Other | Admitting: Psychology

## 2017-05-18 ENCOUNTER — Ambulatory Visit (INDEPENDENT_AMBULATORY_CARE_PROVIDER_SITE_OTHER): Payer: 59 | Admitting: Psychology

## 2017-05-18 DIAGNOSIS — F321 Major depressive disorder, single episode, moderate: Secondary | ICD-10-CM

## 2017-05-25 ENCOUNTER — Ambulatory Visit: Payer: Self-pay | Admitting: Psychology

## 2017-06-01 ENCOUNTER — Ambulatory Visit: Payer: Self-pay | Admitting: Psychology

## 2017-06-08 ENCOUNTER — Ambulatory Visit: Payer: Self-pay | Admitting: Psychology

## 2017-06-15 ENCOUNTER — Ambulatory Visit: Payer: Self-pay | Admitting: Psychology

## 2017-06-22 ENCOUNTER — Ambulatory Visit: Payer: Self-pay | Admitting: Psychology

## 2017-06-28 ENCOUNTER — Ambulatory Visit: Payer: Medicaid Other | Admitting: Family Medicine

## 2017-06-29 ENCOUNTER — Ambulatory Visit: Payer: Self-pay | Admitting: Psychology

## 2017-07-06 ENCOUNTER — Ambulatory Visit: Payer: Self-pay | Admitting: Psychology

## 2017-07-13 ENCOUNTER — Ambulatory Visit: Payer: Self-pay | Admitting: Psychology

## 2017-08-20 ENCOUNTER — Encounter: Payer: Self-pay | Admitting: Neurology

## 2017-12-19 ENCOUNTER — Ambulatory Visit (INDEPENDENT_AMBULATORY_CARE_PROVIDER_SITE_OTHER): Payer: 59 | Admitting: Psychology

## 2017-12-19 DIAGNOSIS — F321 Major depressive disorder, single episode, moderate: Secondary | ICD-10-CM | POA: Diagnosis not present

## 2018-01-09 ENCOUNTER — Ambulatory Visit (INDEPENDENT_AMBULATORY_CARE_PROVIDER_SITE_OTHER): Payer: 59 | Admitting: Psychology

## 2018-01-09 DIAGNOSIS — F324 Major depressive disorder, single episode, in partial remission: Secondary | ICD-10-CM

## 2018-01-24 ENCOUNTER — Ambulatory Visit: Payer: 59 | Admitting: Psychology

## 2018-02-08 ENCOUNTER — Ambulatory Visit: Payer: 59 | Admitting: Psychology

## 2018-02-09 ENCOUNTER — Ambulatory Visit (INDEPENDENT_AMBULATORY_CARE_PROVIDER_SITE_OTHER): Payer: 59 | Admitting: Psychology

## 2018-02-09 DIAGNOSIS — F324 Major depressive disorder, single episode, in partial remission: Secondary | ICD-10-CM

## 2018-02-15 IMAGING — CT CT T SPINE W/ CM
2 of 3 series · 9 of 33 positions shown, 11 images · non-contrast
Comparison: none

CLINICAL DATA: Intermittent headaches over the last 5 months.
Headaches began after an epidural steroid injection 03/03/2016.
Two-view lumbar epidural blood patch is were performed at L5-S1 on
the right without significant improvement in the patient's symptoms.
TECHNIQUE: Contiguous axial images were obtained through the Cervical,
Thoracic, and Lumbar spine after the intrathecal infusion of
infusion. Coronal and sagittal reconstructions were obtained of the
axial image sets.

[Series 2: t spine soft · axial · 0.25mm/px · z∈[-677,-392]mm · 6 of 124 slices shown, 8 images]
[im 19/124  soft-tissue]
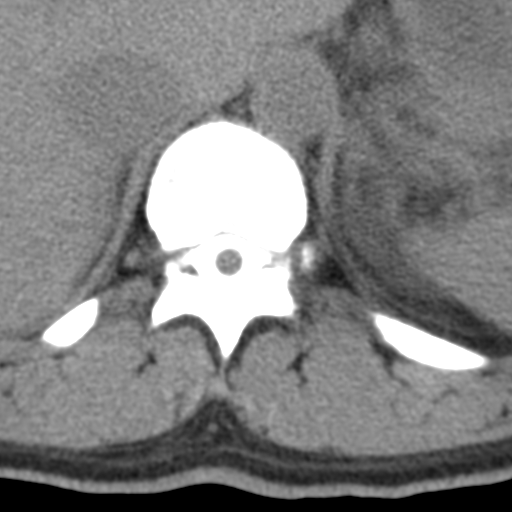
[im 19/124  bone]
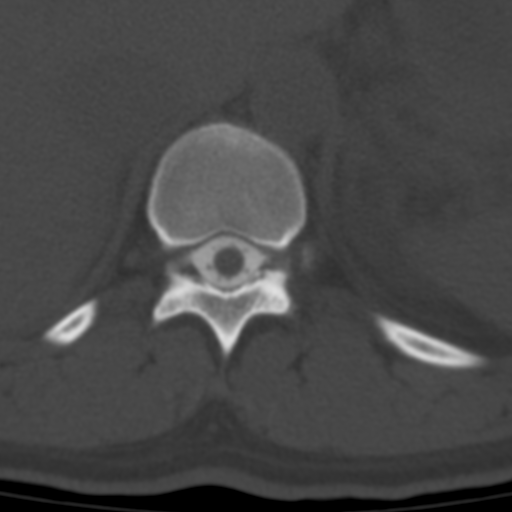
[im 38/124  bone]
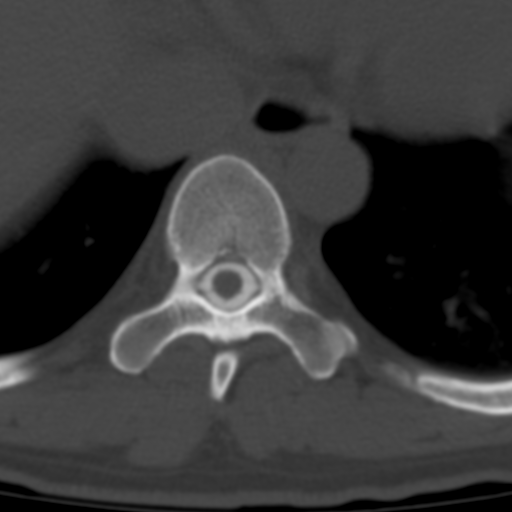
[im 57/124  bone]
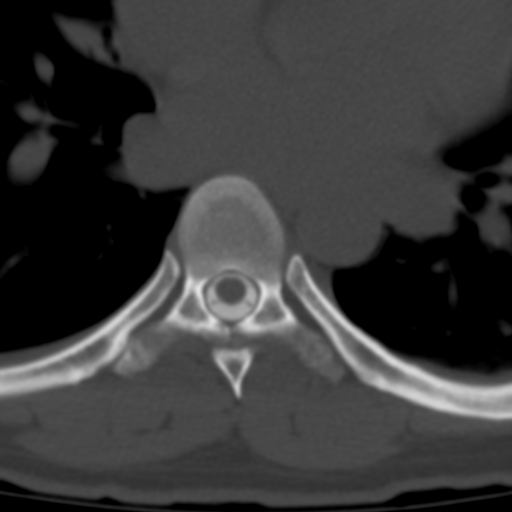
[im 76/124  bone]
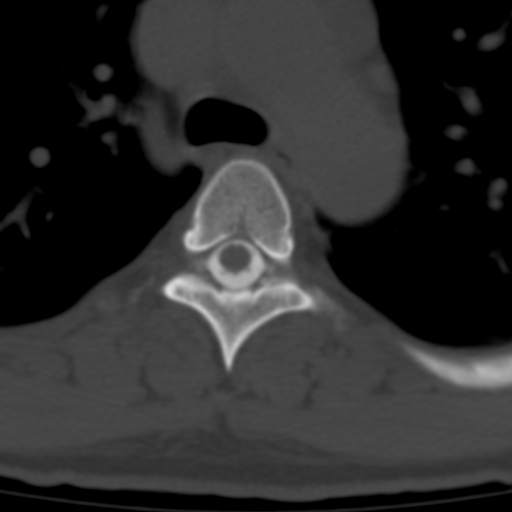
[im 95/124  soft-tissue]
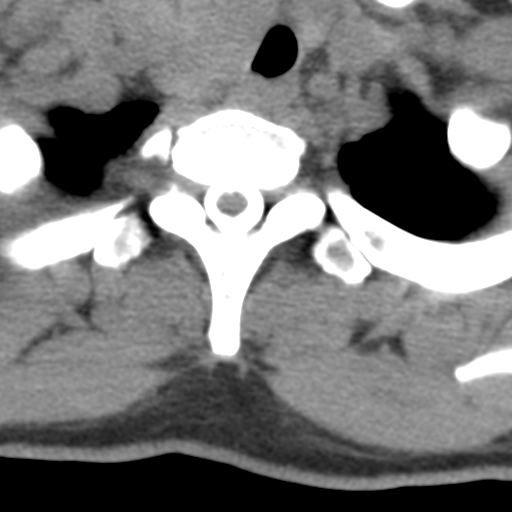
[im 95/124  bone]
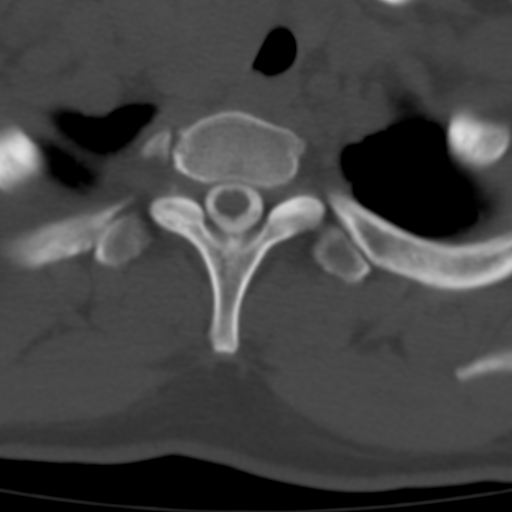
[im 114/124  bone]
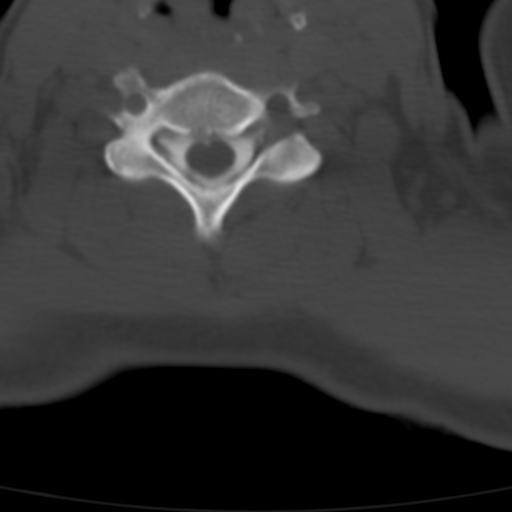

[Series 6: cor · coronal · 0.25mm/px · 3 of 42 slices shown]
[im 9/42  bone]
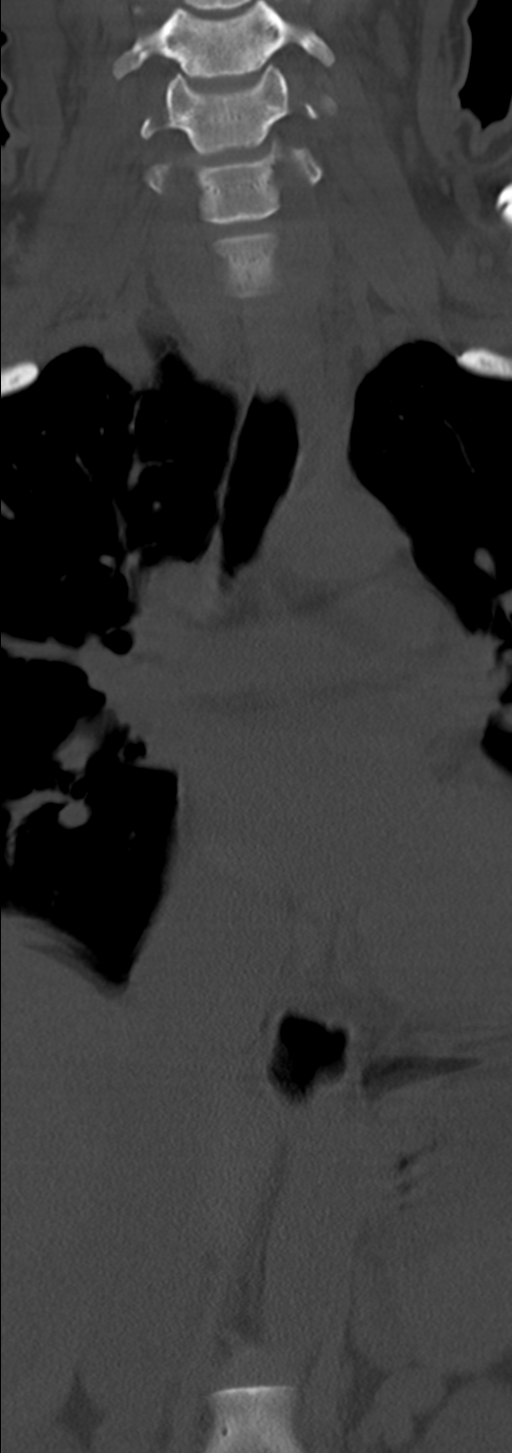
[im 17/42  bone]
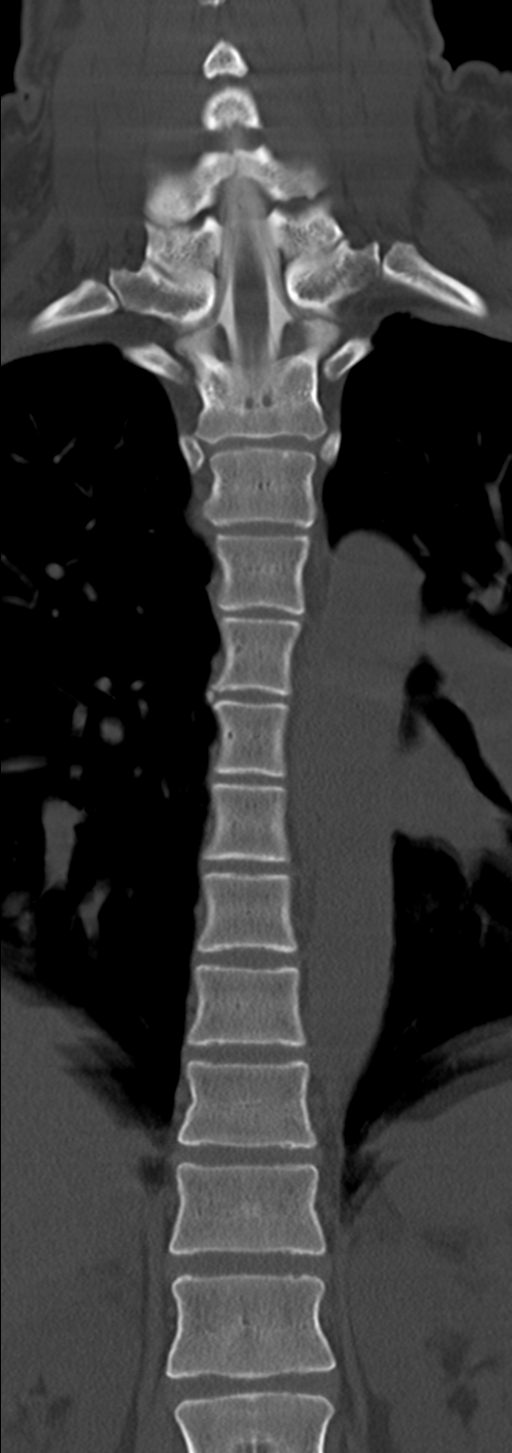
[im 25/42  bone]
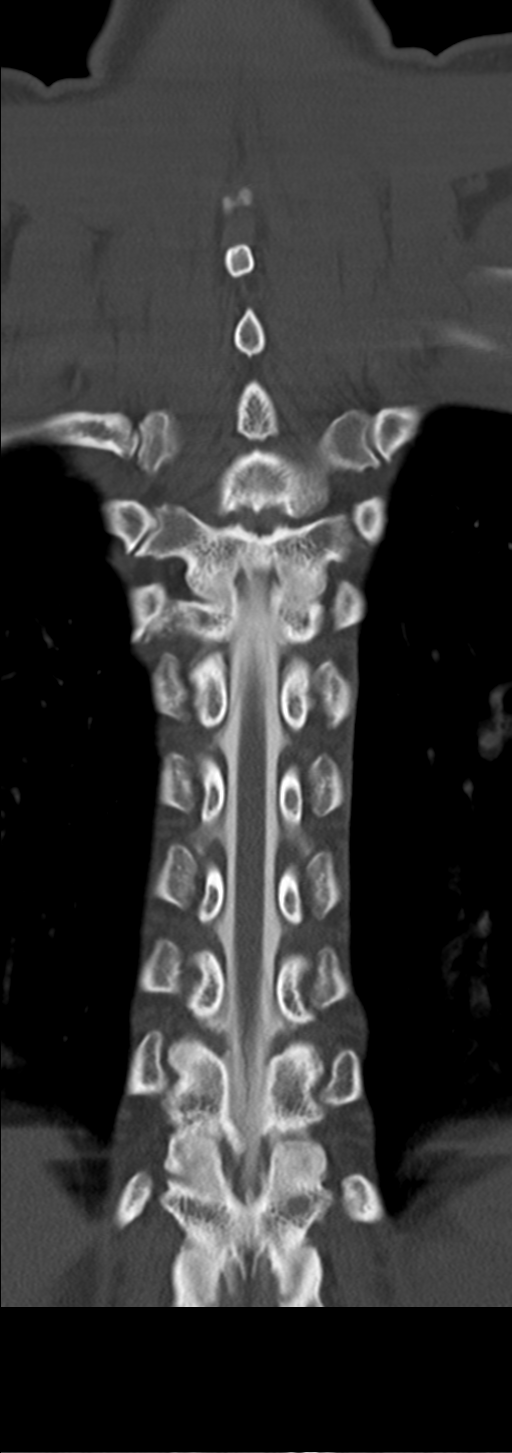

[9 of 33 positions shown; findings below may reference images not displayed]

FLUOROSCOPY TIME:  Radiation Exposure Index (as provided by the
fluoroscopic device): 490.04 uGy*m2

If the device does not provide the exposure index:Fluoroscopy Time:
54 seconds

Number of Acquired Images:  26

PROCEDURE:
LUMBAR PUNCTURE FOR CERVICAL LUMBAR AND THORACIC MYELOGRAM

CERVICAL AND LUMBAR AND THORACIC MYELOGRAM

CT CERVICAL MYELOGRAM

CT LUMBAR MYELOGRAM

CT THORACIC MYELOGRAM

After thorough discussion of risks and benefits of the procedure
including bleeding, infection, injury to nerves, blood vessels,
adjacent structures as well as headache and CSF leak, written and
oral informed consent was obtained. Consent was obtained by Dr.
Nicolina Lok.

Patient was positioned prone on the fluoroscopy table. Local
anesthesia was provided with 1% lidocaine without epinephrine after
prepped and draped in the usual sterile fashion. Puncture was
performed at L1-2 using a 3 1/2 inch 22-gauge spinal needle via left
paramedian approach. Using a single pass through the dura, the
needle was placed within the thecal sac, with return of clear CSF.
10 mL Isovue M 300 was injected into the thecal sac, with normal
opacification of the nerve roots and cauda equina consistent with
free flow within the subarachnoid space. The patient was then moved
to the trendelenburg position and contrast flowed into the Thoracic
and Cervical spine regions.
FINDINGS: CERVICAL, THORACIC, AND LUMBAR MYELOGRAM FINDINGS:

The nerve roots fill normally throughout the lumbar spine. There
somewhat bulbous bilaterally at S2. There is prominence of the nerve
root sheath at L5 on the left and L2 and L3 on the right. No
definite leak is evident on the conventional images.

No significant thoracic disc herniation is evident. No focal leak of
contrast is evident within the thoracic spine.

A mild disc osteophyte complex is present with slight blunting of
the left-sided nerve root sheath at C5-6. Nerve root she is
otherwise fill normally on both sides. Cervical vertebral body
height and alignment are normal.

CT CERVICAL MYELOGRAM FINDINGS:

Seven cervical type vertebral bodies are present. Vertebral body
heights alignment are maintained. Minimal uncovertebral spurring is
present bilaterally at C5-6. There is prominence of the nerve sheath
on the right at C2-3 and on the right at C5-6. No focal CSF leak is
evident.

No focal disc herniation or stenosis is present.

CT LUMBAR MYELOGRAM FINDINGS:

Five non rib-bearing lumbar type vertebral bodies are present.
Vertebral body heights alignment are maintained.

Limited imaging of the abdomen is unremarkable. The conus medullaris
terminates at L1-2, within normal limits.

Mild facet hypertrophy is present at L2-3, L3-4, L4-5, and L5-S1.

Contrast can be seen leaking around the right L1 nerve root at the
L1-2 level there is slight prominence of the nerve root sheath on
the left.

L1-2: Prominent perineural root sleeve cysts are noted bilaterally.

L3-4: Prominent perineural root sleeve cysts are present
bilaterally.

L4-5: A mild broad-based disc protrusion is present without
significant stenosis.

L5-S1: Mild disc bulging and facet hypertrophy are present. Moderate
foraminal stenosis is worse on the left.

CT THORACIC MYELOGRAM FINDINGS:

Twelve rib-bearing thoracic type vertebral bodies are present.
Vertebral body heights and alignment are normal. There is no focal
disc protrusion. Asymmetric facet hypertrophy on the right results
in mild right foraminal stenosis at T10-11. Contrast is noted along
the nerve root sheath on the right at T12-L1. No other focal
foraminal contrast is present in the thoracic spine.
IMPRESSION: 1. CSF leak on the right at L1-2. This may be associated with the
patient's headaches. Epidural blood patch could be performed at this
level.
2. Prominent perineural root sleeve cysts in the lumbar and cervical
spine without contrast leak elsewhere. Bulbous perineural root
sleeve cysts can predispose to spontaneous CSF leak.
3. Mild disc bulging and moderate facet hypertrophy at L5-S1 with
moderate foraminal stenosis bilaterally, worse on the left.
4. Mild right foraminal narrowing at T10-11 secondary to facet
spurring.

## 2018-02-15 IMAGING — CT CT L SPINE W/ CM
3 of 8 series · 10 of 33 positions shown, 11 images · non-contrast
Comparison: none

CLINICAL DATA: Intermittent headaches over the last 5 months.
Headaches began after an epidural steroid injection 03/03/2016.
Two-view lumbar epidural blood patch is were performed at L5-S1 on
the right without significant improvement in the patient's symptoms.
TECHNIQUE: Contiguous axial images were obtained through the Cervical,
Thoracic, and Lumbar spine after the intrathecal infusion of
infusion. Coronal and sagittal reconstructions were obtained of the
axial image sets.

[Series 2: l spine soft · axial · 0.27mm/px · z∈[-865,-763]mm · 2 of 82 slices shown, 3 images]
[im 24/82  soft-tissue]
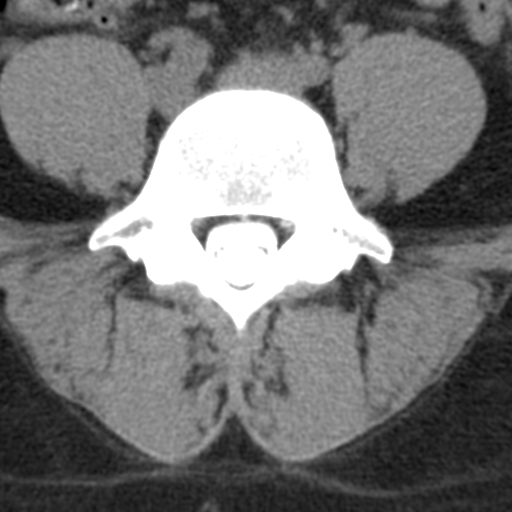
[im 24/82  bone]
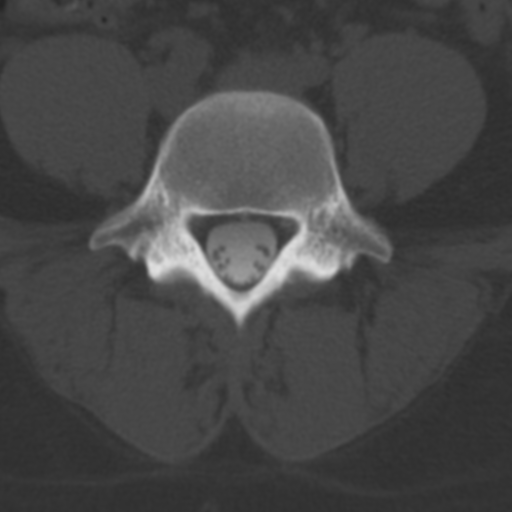
[im 58/82  bone]
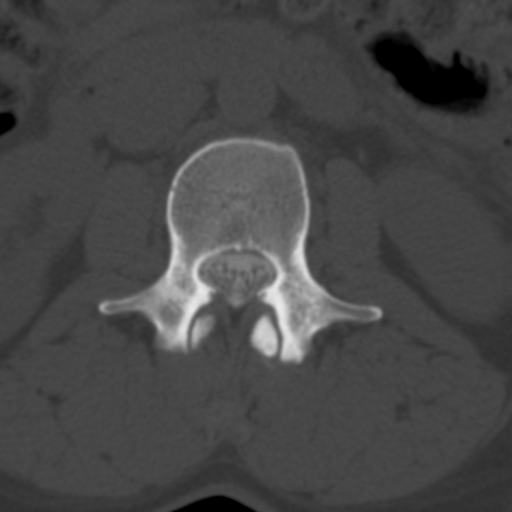

[Series 6: bone cor · coronal · 0.27mm/px · 3 of 47 slices shown]
[im 10/47  bone]
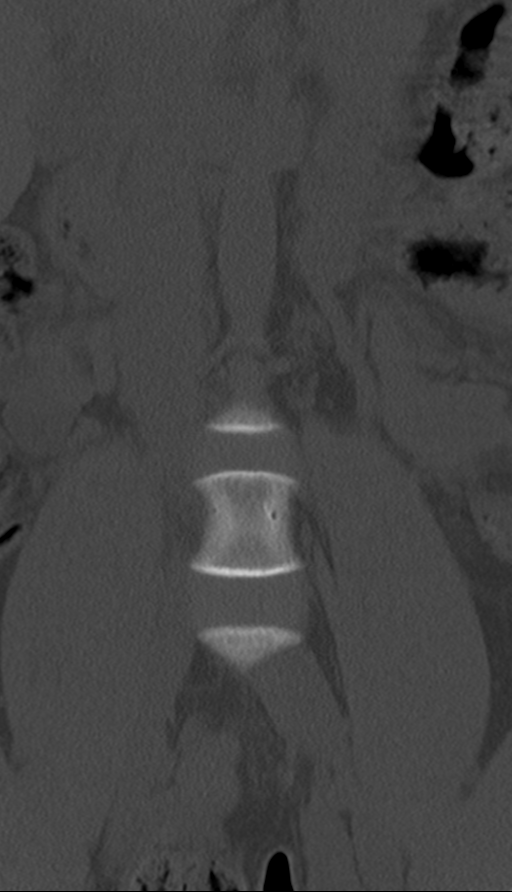
[im 19/47  bone]
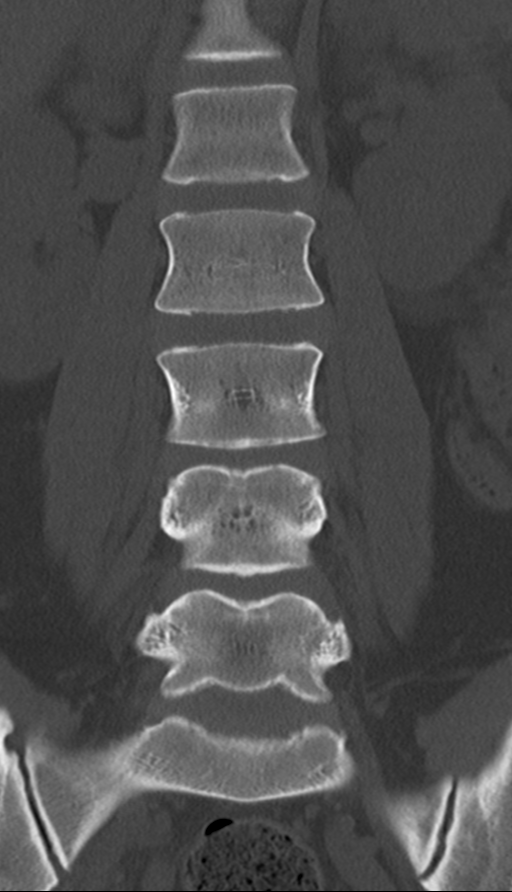
[im 28/47  bone]
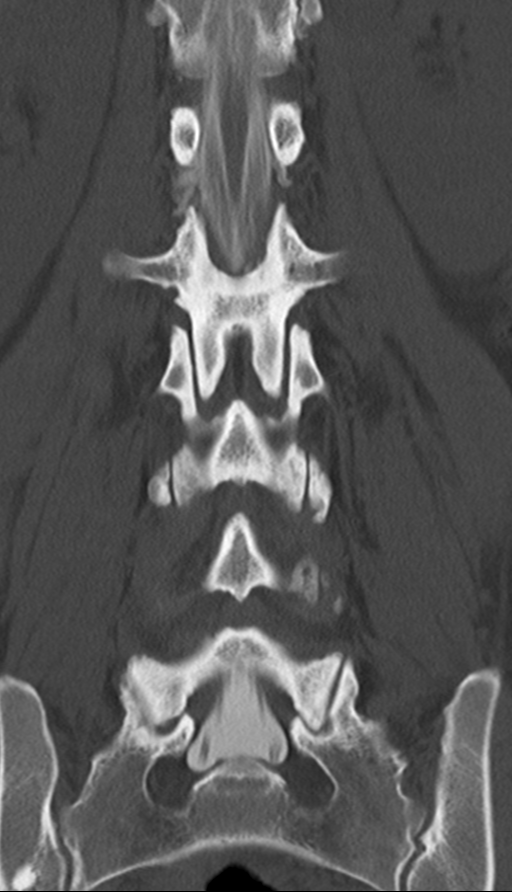

[Series 7: sag bone · sagittal · 0.27mm/px · 5 of 47 slices shown]
[im 16/47  bone]
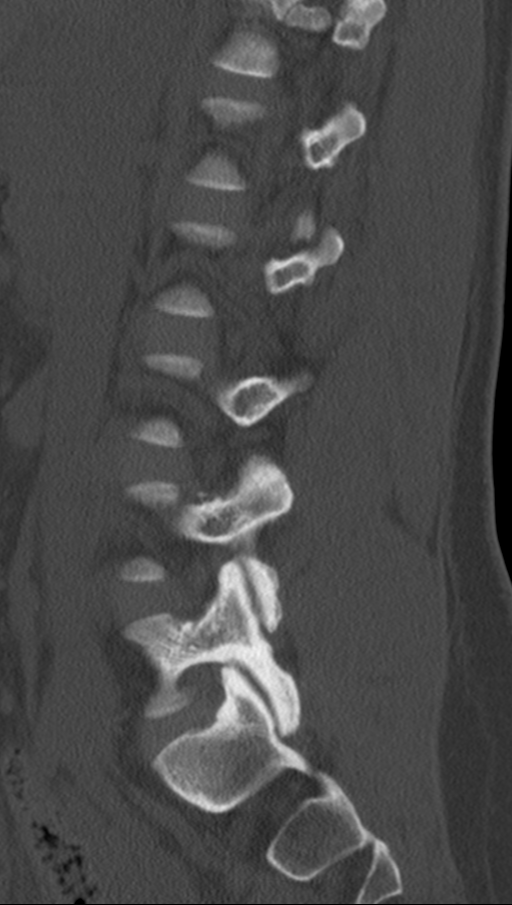
[im 20/47  bone]
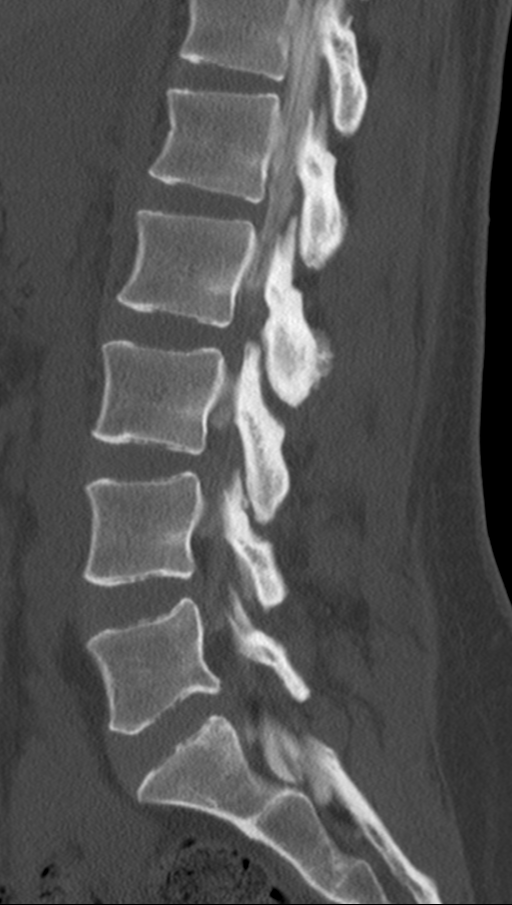
[im 24/47  bone]
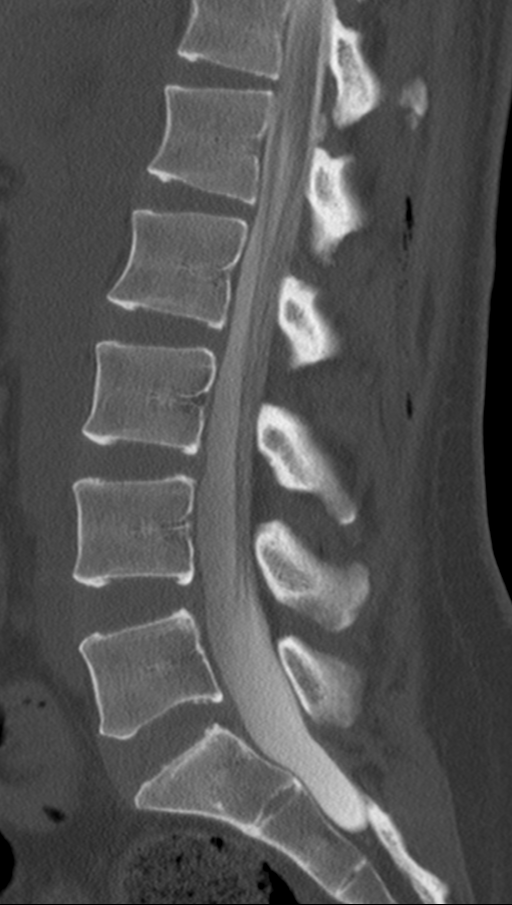
[im 27/47  bone]
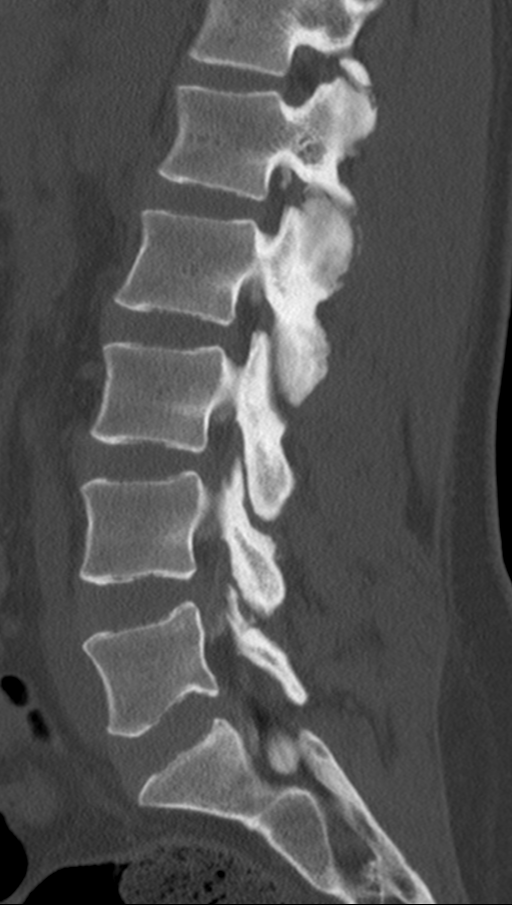
[im 31/47  bone]
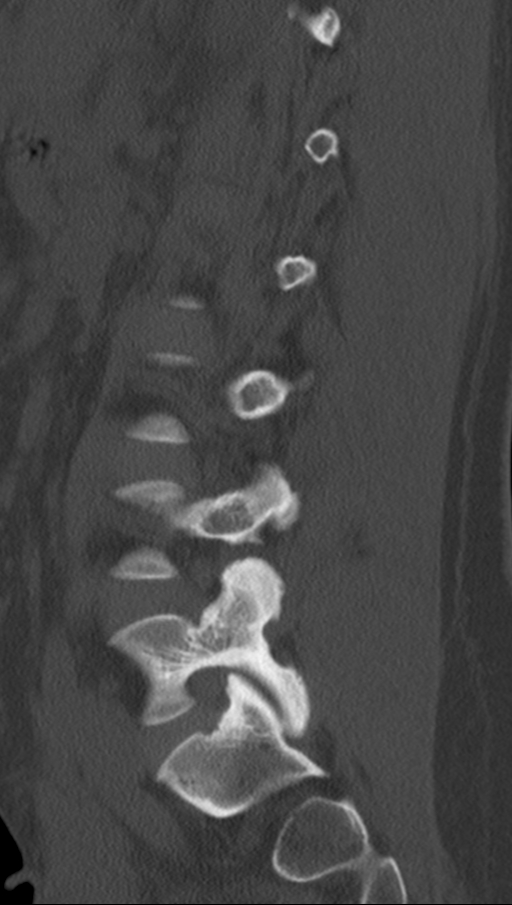

[10 of 33 positions shown; findings below may reference images not displayed]

FLUOROSCOPY TIME:  Radiation Exposure Index (as provided by the
fluoroscopic device): 490.04 uGy*m2

If the device does not provide the exposure index:Fluoroscopy Time:
54 seconds

Number of Acquired Images:  26

PROCEDURE:
LUMBAR PUNCTURE FOR CERVICAL LUMBAR AND THORACIC MYELOGRAM

CERVICAL AND LUMBAR AND THORACIC MYELOGRAM

CT CERVICAL MYELOGRAM

CT LUMBAR MYELOGRAM

CT THORACIC MYELOGRAM

After thorough discussion of risks and benefits of the procedure
including bleeding, infection, injury to nerves, blood vessels,
adjacent structures as well as headache and CSF leak, written and
oral informed consent was obtained. Consent was obtained by Dr.
Nicolina Lok.

Patient was positioned prone on the fluoroscopy table. Local
anesthesia was provided with 1% lidocaine without epinephrine after
prepped and draped in the usual sterile fashion. Puncture was
performed at L1-2 using a 3 1/2 inch 22-gauge spinal needle via left
paramedian approach. Using a single pass through the dura, the
needle was placed within the thecal sac, with return of clear CSF.
10 mL Isovue M 300 was injected into the thecal sac, with normal
opacification of the nerve roots and cauda equina consistent with
free flow within the subarachnoid space. The patient was then moved
to the trendelenburg position and contrast flowed into the Thoracic
and Cervical spine regions.
FINDINGS: CERVICAL, THORACIC, AND LUMBAR MYELOGRAM FINDINGS:

The nerve roots fill normally throughout the lumbar spine. There
somewhat bulbous bilaterally at S2. There is prominence of the nerve
root sheath at L5 on the left and L2 and L3 on the right. No
definite leak is evident on the conventional images.

No significant thoracic disc herniation is evident. No focal leak of
contrast is evident within the thoracic spine.

A mild disc osteophyte complex is present with slight blunting of
the left-sided nerve root sheath at C5-6. Nerve root she is
otherwise fill normally on both sides. Cervical vertebral body
height and alignment are normal.

CT CERVICAL MYELOGRAM FINDINGS:

Seven cervical type vertebral bodies are present. Vertebral body
heights alignment are maintained. Minimal uncovertebral spurring is
present bilaterally at C5-6. There is prominence of the nerve sheath
on the right at C2-3 and on the right at C5-6. No focal CSF leak is
evident.

No focal disc herniation or stenosis is present.

CT LUMBAR MYELOGRAM FINDINGS:

Five non rib-bearing lumbar type vertebral bodies are present.
Vertebral body heights alignment are maintained.

Limited imaging of the abdomen is unremarkable. The conus medullaris
terminates at L1-2, within normal limits.

Mild facet hypertrophy is present at L2-3, L3-4, L4-5, and L5-S1.

Contrast can be seen leaking around the right L1 nerve root at the
L1-2 level there is slight prominence of the nerve root sheath on
the left.

L1-2: Prominent perineural root sleeve cysts are noted bilaterally.

L3-4: Prominent perineural root sleeve cysts are present
bilaterally.

L4-5: A mild broad-based disc protrusion is present without
significant stenosis.

L5-S1: Mild disc bulging and facet hypertrophy are present. Moderate
foraminal stenosis is worse on the left.

CT THORACIC MYELOGRAM FINDINGS:

Twelve rib-bearing thoracic type vertebral bodies are present.
Vertebral body heights and alignment are normal. There is no focal
disc protrusion. Asymmetric facet hypertrophy on the right results
in mild right foraminal stenosis at T10-11. Contrast is noted along
the nerve root sheath on the right at T12-L1. No other focal
foraminal contrast is present in the thoracic spine.
IMPRESSION: 1. CSF leak on the right at L1-2. This may be associated with the
patient's headaches. Epidural blood patch could be performed at this
level.
2. Prominent perineural root sleeve cysts in the lumbar and cervical
spine without contrast leak elsewhere. Bulbous perineural root
sleeve cysts can predispose to spontaneous CSF leak.
3. Mild disc bulging and moderate facet hypertrophy at L5-S1 with
moderate foraminal stenosis bilaterally, worse on the left.
4. Mild right foraminal narrowing at T10-11 secondary to facet
spurring.

## 2018-02-16 ENCOUNTER — Ambulatory Visit (INDEPENDENT_AMBULATORY_CARE_PROVIDER_SITE_OTHER): Payer: Medicaid Other | Admitting: Psychology

## 2018-02-16 DIAGNOSIS — F324 Major depressive disorder, single episode, in partial remission: Secondary | ICD-10-CM

## 2018-03-09 ENCOUNTER — Encounter: Payer: Self-pay | Admitting: "Endocrinology

## 2018-03-09 ENCOUNTER — Ambulatory Visit: Payer: Medicaid Other | Admitting: "Endocrinology

## 2018-03-16 ENCOUNTER — Encounter: Payer: Medicaid Other | Attending: Physical Medicine & Rehabilitation | Admitting: Physical Medicine & Rehabilitation

## 2018-03-16 ENCOUNTER — Encounter

## 2018-03-16 ENCOUNTER — Ambulatory Visit (INDEPENDENT_AMBULATORY_CARE_PROVIDER_SITE_OTHER): Payer: Medicaid Other | Admitting: Psychology

## 2018-03-16 ENCOUNTER — Encounter: Payer: Self-pay | Admitting: Physical Medicine & Rehabilitation

## 2018-03-16 VITALS — BP 107/75 | HR 63 | Ht 69.0 in | Wt 180.0 lb

## 2018-03-16 DIAGNOSIS — G2581 Restless legs syndrome: Secondary | ICD-10-CM | POA: Diagnosis not present

## 2018-03-16 DIAGNOSIS — R269 Unspecified abnormalities of gait and mobility: Secondary | ICD-10-CM

## 2018-03-16 DIAGNOSIS — G8929 Other chronic pain: Secondary | ICD-10-CM | POA: Diagnosis present

## 2018-03-16 DIAGNOSIS — F324 Major depressive disorder, single episode, in partial remission: Secondary | ICD-10-CM

## 2018-03-16 DIAGNOSIS — G894 Chronic pain syndrome: Secondary | ICD-10-CM

## 2018-03-16 DIAGNOSIS — M5441 Lumbago with sciatica, right side: Secondary | ICD-10-CM | POA: Diagnosis not present

## 2018-03-16 DIAGNOSIS — E559 Vitamin D deficiency, unspecified: Secondary | ICD-10-CM | POA: Diagnosis not present

## 2018-03-16 DIAGNOSIS — F329 Major depressive disorder, single episode, unspecified: Secondary | ICD-10-CM

## 2018-03-16 DIAGNOSIS — M545 Low back pain: Secondary | ICD-10-CM | POA: Insufficient documentation

## 2018-03-16 DIAGNOSIS — Z9889 Other specified postprocedural states: Secondary | ICD-10-CM | POA: Insufficient documentation

## 2018-03-16 DIAGNOSIS — G479 Sleep disorder, unspecified: Secondary | ICD-10-CM | POA: Diagnosis not present

## 2018-03-16 DIAGNOSIS — M791 Myalgia, unspecified site: Secondary | ICD-10-CM | POA: Diagnosis not present

## 2018-03-16 DIAGNOSIS — M542 Cervicalgia: Secondary | ICD-10-CM | POA: Diagnosis present

## 2018-03-16 DIAGNOSIS — G43909 Migraine, unspecified, not intractable, without status migrainosus: Secondary | ICD-10-CM | POA: Diagnosis not present

## 2018-03-16 MED ORDER — PREGABALIN 50 MG PO CAPS: 50.0000 mg | ORAL_CAPSULE | Freq: Two times a day (BID) | ORAL | 1 refills | Status: DC

## 2018-03-16 MED ORDER — DULOXETINE HCL 30 MG PO CPEP: 30.0000 mg | ORAL_CAPSULE | Freq: Every day | ORAL | 1 refills | Status: DC

## 2018-03-16 NOTE — Progress Notes (Addendum)
Subjective:    Patient ID: Linda Reid, female    DOB: 1985/08/26, 32 y.o.   MRN: [id number]  HPI 32 y/o female with pmh of migraines, chronic pain presents for follow up of pain in neck and back > lower back.   Initially stated: Started Aug 2016, during her 4th pregnancy.  Gotten progressively worse.  Rest improves the pain. Activity after rest improves the pain.  Sharp pain.  Radiates to her toes.  Intermittent throughout the day. She has associated numbness, tingling, weakness.  She has tried medications without benefit.  She had ESI, which exacerbated the symptoms and she had a CSF leak.  She has had 3 blood patches, but continues to have back pain. Her neck pain and migraines started after this point.  She had another blood patch Dec 14th, with ?improvement. She denies falls.  Pain limits her from doing ADLs, like putting on her clothes.  She saw Psychology. Pt currently works in [place] doing a lot of ambulation.   Last clinic visit 10/21/16. She had trigger point injections of the time. Since that time patient states she has gotten worse.  Pain now with radiation b/l UE and RLE. She had a partial thyroidectomy due to benign thyroid nodule.  She seen Endo and states her lab values are uncontrolled. She does not note much benefit with trigger point injections.  She had PT without benefit.  She saw Psychology and started doing Yoga, which is helping somewhat.  She is using heat/cold therapy. She states she had side effects with Cymbalta. Gabapentin helps, but causes drowsiness. Baclofen helps.  Lidoderm patches without benefit.  She never received call back from [facility]. Fall coming up stairs. She is still on the 3rd floor.    Pain Inventory Average Pain 9 Pain Right Now 7 My pain is intermittent, sharp, dull, tingling and aching  In the last 24 hours, has pain interfered with the following? General activity 9 Relation with others 10 Enjoyment of life 9 What TIME of day is your pain  at its worst? morning Sleep (in general) Poor  Pain is worse with: walking and sitting Pain improves with: rest and medication Relief from Meds: 6  Mobility walk without assistance ability to climb steps?  no do you drive?  yes  Function employed # of hrs/week 40  Neuro/Psych weakness numbness tremor tingling spasms dizziness depression anxiety  Prior Studies Any changes since last visit?  no  Physicians involved in your care Any changes since last visit?  no   Family History  Problem Relation Age of Onset  . Cancer Maternal Grandmother        breast  . Diabetes Maternal Grandmother   . Hypertension Maternal Grandmother   . Heart disease Maternal Grandfather   . Hypertension Maternal Grandfather   . Hypertension Paternal Grandmother   . Hypertension Paternal Grandfather   . Premature birth Daughter   . Asthma Daughter   . Diabetes Maternal Aunt   . Hypertension Maternal Aunt   . Stroke Maternal Aunt   . Diabetes Maternal Uncle   . Hypertension Maternal Uncle   . Hypertension Paternal Aunt   . Hypertension Paternal Uncle    Social History   Socioeconomic History  . Marital status: Married    Spouse name: Psychiatrist   . Number of children: 3  . Years of education: 4 Bachelor  . Highest education level: Not on file  Occupational History  . Occupation: Programmer, applications  Employer: [employer]    Comment: Left position when pregnant  Social Needs  . Financial resource strain: Not on file  . Food insecurity:    Worry: Not on file    Inability: Not on file  . Transportation needs:    Medical: Not on file    Non-medical: Not on file  Tobacco Use  . Smoking status: Never Smoker  . Smokeless tobacco: Never Used  Substance and Sexual Activity  . Alcohol use: No  . Drug use: No  . Sexual activity: Yes    Birth control/protection: Implant  Lifestyle  . Physical activity:    Days per week: Not on file    Minutes per session: Not on file  .  Stress: Not on file  Relationships  . Social connections:    Talks on phone: Not on file    Gets together: Not on file    Attends religious service: Not on file    Active member of club or organization: Not on file    Attends meetings of clubs or organizations: Not on file    Relationship status: Not on file  Other Topics Concern  . Not on file  Social History Narrative   Patient lives with her fiance and 3 children.    Her fiance is the father of her current pregnancy, other three children were fathered by her previous partner.    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     Caffeine use: none   Right-handed   Past Surgical History:  Procedure Laterality Date  . DG MYLEOGRAM LUMBAR SPINE ([place] [facility])    . DILATION AND CURETTAGE OF UTERUS  06/25/2002  . THYROIDECTOMY Right 12/13/2016   Procedure: RIGHT HEMI-THYROIDECTOMY;  Surgeon: Leta Baptist, MD;  Location: [facility];  Service: ENT;  Laterality: Right;  . TONSILLECTOMY     Past Medical History:  Diagnosis Date  . Arthritis    lower back, right leg  . Back pain   . Cerebrospinal fluid leak from spinal puncture 03/03/2016   after epidural steroid injection; states has had 2 blood patch procedures   . Difficulty swallowing solids    certain foods  . Hyperthyroidism   . Migraines   . Nasal congestion 12/06/2016  . Neck pain of over 3 months duration    posterior and right side  . Neuropathy    right leg  . Restless leg syndrome   . Thyroid nodule 11/2016   right  . Tracheal deviation    due to thyroid nodule, per pt.  . Urinary frequency    BP 107/75   Pulse 63   Ht 5\' 9"  (1.753 m)   Wt 180 lb (81.6 kg)   SpO2 99%   BMI 26.58 kg/m   Opioid Risk Score:   Fall Risk Score:  `1  Depression screen PHQ 2/9  Depression screen [facility] 2/9 03/23/2017 03/16/2017 10/21/2016 08/26/2016  Decreased Interest 0 0 2 2  Down,  Depressed, Hopeless 0 0 2 2  PHQ - 2 Score 0 0 4 4  Altered sleeping - - - 3  Tired, decreased energy - - - 3  Change in appetite - - - 2  Feeling bad or failure about yourself  - - - 2  Trouble concentrating - - - 1  Moving slowly or fidgety/restless - - - 0  Suicidal thoughts - - - 0  PHQ-9 Score - - - 15  Difficult doing work/chores - - - Very difficult     Review of Systems  Constitutional: Positive for appetite change and unexpected weight change.  HENT: Negative.   Eyes: Negative.   Respiratory: Negative.   Cardiovascular: Negative.   Gastrointestinal: Positive for nausea.  Endocrine: Negative.   Genitourinary: Negative.   Musculoskeletal: Positive for arthralgias, back pain, myalgias and neck pain.  Skin: Negative.   Allergic/Immunologic: Negative.   Neurological: Positive for tremors, weakness and numbness.  Hematological: Negative.   Psychiatric/Behavioral: Positive for dysphoric mood. The patient is nervous/anxious.   All other systems reviewed and are negative.      Objective:   Physical Exam Gen: NAD. Vital signs reviewed HENT: Normocephalic, Atraumatic Eyes: EOMI. No discharge.  Cardio: RRR. No JVD. Pulm: B/l clear to auscultation.  Effort normal Abd: nondistended, BS+ MSK:   Gait antalgic.              TTP with touch along right thoracolumbar PSPs and right thigh  TTP neck.    No edema.  Neuro:              Sensation intact to light touch b/l LE             Strength          4-/5 in all RLE myotomes (pain inhibition)                                     4+-55/5 in all LLE myotomes Skin: Warm and Dry. Intact    Assessment & Plan:  32 y/o female with pmh of migraines, chronic pain presents for follow up of pain in neck and back > lower back.    1. Chronic low back/neck pain with ?Fibromyalgia             CT 11/30 reviewed showing CSF leak L1-2. Disc bulge with facet arthropathy at L5-S1, MRI 11/2016 reviewed, mild facet arthropathy and L5-S1 foraminal  narrowing  Lidoderm patched ineffective  Gabapentin causes drowsiness  NCS/EMG 2018 reviewed, WNL             Cont heat/cold             Cont follow up with Psychology  Will order Cymbalta 30 with food qhs, will consider increase to 60mg  if tolerated  Will order Lyrica to 50 BID  Cont Baclofen 10 TID PRN             Will order TENS unit   Will order Mag             Will defer facet injections at this due to pt fear and side effects  Will consider pool therapy              2. Migraines/Headaches             Currently being managed by Neurology             See #1             Consider Botox in future if appropriate  3. Gait abnormality             Pt does not want assistive device at present             Pt in the process of trying to move to lower level unit  4. Sleep disturbance  See #1  Will consider Elavil in future, hold off for now due to sedation with other meds  5. Reactive anxiety/depression             Cymbalta ordered  6. Myalgia             Trigger point injections with limited benefit, will consider retrial in future  7. Vit D deficiency  Cont Vit D supplement  Vit D 18.4 on 7/23  Will order labs

## 2018-03-21 LAB — MAGNESIUM: Magnesium: 1.8 mg/dL (ref 1.6–2.3)

## 2018-03-21 LAB — VITAMIN D 25 HYDROXY (VIT D DEFICIENCY, FRACTURES): VIT D 25 HYDROXY: 16.9 ng/mL — AB (ref 30.0–100.0)

## 2018-03-29 ENCOUNTER — Telehealth: Payer: Self-pay | Admitting: *Deleted

## 2018-03-29 ENCOUNTER — Ambulatory Visit: Payer: 59 | Admitting: Psychology

## 2018-03-29 NOTE — Telephone Encounter (Signed)
Prior authorization for Lyrica was submitted to [facility]

## 2018-04-04 ENCOUNTER — Telehealth: Payer: Self-pay | Admitting: *Deleted

## 2018-04-04 ENCOUNTER — Ambulatory Visit: Payer: Medicaid Other | Admitting: Neurology

## 2018-04-04 ENCOUNTER — Encounter: Payer: Self-pay | Admitting: Neurology

## 2018-04-04 ENCOUNTER — Encounter

## 2018-04-04 NOTE — Telephone Encounter (Signed)
Pt no showed f/u appt on 04/04/2018 @ 3:00 pm.

## 2018-04-11 ENCOUNTER — Ambulatory Visit (INDEPENDENT_AMBULATORY_CARE_PROVIDER_SITE_OTHER): Payer: Self-pay | Admitting: Psychology

## 2018-04-11 DIAGNOSIS — F324 Major depressive disorder, single episode, in partial remission: Secondary | ICD-10-CM

## 2018-04-13 ENCOUNTER — Encounter: Payer: Medicaid Other | Attending: Physical Medicine & Rehabilitation | Admitting: Physical Medicine & Rehabilitation

## 2018-04-13 DIAGNOSIS — R269 Unspecified abnormalities of gait and mobility: Secondary | ICD-10-CM | POA: Insufficient documentation

## 2018-04-13 DIAGNOSIS — G43909 Migraine, unspecified, not intractable, without status migrainosus: Secondary | ICD-10-CM | POA: Insufficient documentation

## 2018-04-13 DIAGNOSIS — G479 Sleep disorder, unspecified: Secondary | ICD-10-CM | POA: Insufficient documentation

## 2018-04-13 DIAGNOSIS — E559 Vitamin D deficiency, unspecified: Secondary | ICD-10-CM | POA: Insufficient documentation

## 2018-04-13 DIAGNOSIS — F329 Major depressive disorder, single episode, unspecified: Secondary | ICD-10-CM | POA: Insufficient documentation

## 2018-04-13 DIAGNOSIS — M791 Myalgia, unspecified site: Secondary | ICD-10-CM | POA: Insufficient documentation

## 2018-04-13 DIAGNOSIS — Z9889 Other specified postprocedural states: Secondary | ICD-10-CM | POA: Insufficient documentation

## 2018-04-13 DIAGNOSIS — M545 Low back pain: Secondary | ICD-10-CM | POA: Insufficient documentation

## 2018-04-13 DIAGNOSIS — M542 Cervicalgia: Secondary | ICD-10-CM | POA: Insufficient documentation

## 2018-04-13 DIAGNOSIS — G8929 Other chronic pain: Secondary | ICD-10-CM | POA: Insufficient documentation

## 2018-04-13 DIAGNOSIS — G2581 Restless legs syndrome: Secondary | ICD-10-CM | POA: Insufficient documentation

## 2018-04-27 ENCOUNTER — Ambulatory Visit (INDEPENDENT_AMBULATORY_CARE_PROVIDER_SITE_OTHER): Payer: Medicaid Other | Admitting: Psychology

## 2018-04-27 DIAGNOSIS — F324 Major depressive disorder, single episode, in partial remission: Secondary | ICD-10-CM

## 2018-05-04 ENCOUNTER — Encounter: Payer: Self-pay | Admitting: *Deleted

## 2018-05-04 ENCOUNTER — Other Ambulatory Visit: Payer: Medicaid Other | Admitting: Women's Health

## 2018-05-25 ENCOUNTER — Ambulatory Visit: Payer: Medicaid Other | Admitting: Psychology

## 2018-05-30 ENCOUNTER — Ambulatory Visit: Payer: Medicaid Other | Admitting: "Endocrinology

## 2018-05-30 ENCOUNTER — Other Ambulatory Visit: Payer: Self-pay | Admitting: "Endocrinology

## 2018-05-30 DIAGNOSIS — E0590 Thyrotoxicosis, unspecified without thyrotoxic crisis or storm: Secondary | ICD-10-CM

## 2018-05-30 LAB — TSH: TSH: 0.87 mIU/L

## 2018-05-30 LAB — T3, FREE: T3, Free: 3 pg/mL (ref 2.3–4.2)

## 2018-05-30 LAB — T4, FREE: Free T4: 1 ng/dL (ref 0.8–1.8)

## 2018-06-02 ENCOUNTER — Ambulatory Visit (INDEPENDENT_AMBULATORY_CARE_PROVIDER_SITE_OTHER): Payer: Medicaid Other | Admitting: "Endocrinology

## 2018-06-02 ENCOUNTER — Encounter: Payer: Self-pay | Admitting: "Endocrinology

## 2018-06-02 VITALS — BP 106/70 | HR 81 | Ht 69.0 in | Wt 177.0 lb

## 2018-06-02 DIAGNOSIS — E559 Vitamin D deficiency, unspecified: Secondary | ICD-10-CM | POA: Diagnosis not present

## 2018-06-02 DIAGNOSIS — E042 Nontoxic multinodular goiter: Secondary | ICD-10-CM

## 2018-06-02 DIAGNOSIS — E0590 Thyrotoxicosis, unspecified without thyrotoxic crisis or storm: Secondary | ICD-10-CM

## 2018-06-02 MED ORDER — VITAMIN D3 125 MCG (5000 UT) PO CAPS: 5000.0000 [IU] | ORAL_CAPSULE | Freq: Every day | ORAL | 0 refills | Status: DC

## 2018-06-02 NOTE — Progress Notes (Signed)
Endocrinology follow-up note   Subjective:    Patient ID: Linda Reid, female    DOB: July 09, 1986, PCP Muse, Noel Journey., PA-C   Past Medical History:  Diagnosis Date  . Arthritis    lower back, right leg  . Back pain   . Cerebrospinal fluid leak from spinal puncture 03/03/2016   after epidural steroid injection; states has had 2 blood patch procedures   . Difficulty swallowing solids    certain foods  . Hyperthyroidism   . Migraines   . Nasal congestion 12/06/2016  . Neck pain of over 3 months duration    posterior and right side  . Neuropathy    right leg  . Restless leg syndrome   . Thyroid nodule 11/2016   right  . Tracheal deviation    due to thyroid nodule, per pt.  . Urinary frequency    Past Surgical History:  Procedure Laterality Date  . DG MYLEOGRAM LUMBAR SPINE ([place] HX)    . DILATION AND CURETTAGE OF UTERUS  06/25/2002  . THYROIDECTOMY Right 12/13/2016   Procedure: RIGHT HEMI-THYROIDECTOMY;  Surgeon: Leta Baptist, MD;  Location: [facility];  Service: ENT;  Laterality: Right;  . TONSILLECTOMY     Social History   Socioeconomic History  . Marital status: Married    Spouse name: Psychiatrist   . Number of children: 3  . Years of education: 4 Bachelor  . Highest education level: Not on file  Occupational History  . 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 Financial resource strain: Not on file  . Food insecurity:    Worry: Not on file    Inability: Not on file  . Transportation needs:    Medical: Not on file    Non-medical: Not on file  Tobacco Use  . Smoking status: Never Smoker  . Smokeless tobacco: Never Used  Substance and Sexual Activity  . Alcohol use: No  . Drug use: No  . Sexual activity: Yes    Birth control/protection: Implant  Lifestyle  . Physical activity:    Days per week: Not on file    Minutes per session: Not on file  .  Stress: Not on file  Relationships  . Social connections:    Talks on phone: Not on file    Gets together: Not on file    Attends religious service: Not on file    Active member of club or organization: Not on file    Attends meetings of clubs or organizations: Not on file    Relationship status: Not on file  Other Topics Concern  . Not on file  Social History Narrative   Patient lives with her fiance and 3 children.    Her fiance is the father of her current pregnancy, other three children were fathered by her previous partner.    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     Caffeine use: none   Right-handed   Outpatient Encounter Medications as of 06/02/2018  Medication Sig  . baclofen (LIORESAL) 10 MG tablet Take 10 mg by mouth 3 (three) times daily as needed for muscle spasms.  . Cholecalciferol (VITAMIN D3) 5000 units CAPS Take 1 capsule (5,000 Units total) by mouth daily.  . DULoxetine (CYMBALTA) 30 MG capsule Take 1 capsule (30  mg total) by mouth at bedtime.  Marland Kitchen etonogestrel (NEXPLANON) 68 MG IMPL implant 1 each by Subdermal route once.  Marland Kitchen tiZANidine (ZANAFLEX) 4 MG tablet Take 1 tablet (4 mg total) by mouth every 6 (six) hours as needed for muscle spasms.  . [DISCONTINUED] calcium carbonate (OS-CAL - DOSED IN MG OF ELEMENTAL CALCIUM) 1250 (500 Ca) MG tablet Take 1 tablet (500 mg of elemental calcium total) by mouth 3 (three) times daily with meals.  . [DISCONTINUED] Potassium Chloride ER 20 MEQ TBCR Take 1 tablet by mouth daily with breakfast.  . [DISCONTINUED] pregabalin (LYRICA) 50 MG capsule Take 1 capsule (50 mg total) by mouth 2 (two) times daily.   Facility-Administered Encounter Medications as of 06/02/2018  Medication  . triamcinolone acetonide (KENALOG) 10 MG/ML injection 10 mg   ALLERGIES: No Known Allergies  VACCINATION STATUS: Immunization History   Administered Date(s) Administered  . Influenza,inj,Quad PF,6+ Mos 05/21/2015  . Tdap 06/21/2015    HPI 32 year old female patient with medical history as above.   She is being seen in f/u for subclinical hyperthyroidism.  He has history of right hemi-thyroidectomy in 2018 with benign outcomes. - She had repeat thyroid function tests since last visit, showing normal thyroid function tests. -She still complains of dysphagia, not associated with shortness of breath nor voice change. - She has stable weight.  She was given calcium and potassium supplements during her last visit which helped with the muscle cramps that she complained prior to her last visit.  -She denies any family history of thyroid dysfunction nor thyroid cancer. - She is a mother of 4 kids age  Range 27-14. She is not currently on any antithyroid medications nor thyroid hormone replacement.  Review of Systems  Constitutional: + Steady weight, + fatigue, no subjective hyperthermia, no subjective hypothermia Eyes: no blurry vision, no xerophthalmia ENT: no sore throat, + nodules palpated in the left lobe of the thyroid,  She is status post right hemithyroidectomy in April 2018, + dysphagia/odynophagia, no hoarseness Cardiovascular: no Chest Pain, no Shortness of Breath, no palpitations, no leg swelling Musculoskeletal: no muscle/joint aches Skin: no rashes Neurological: no tremors, no numbness, no tingling, no dizziness Psychiatric: no depression, no anxiety  Objective:    BP 106/70   Pulse 81   Ht 5\' 9"  (1.753 m)   Wt 177 lb (80.3 kg)   BMI 26.14 kg/m   Wt Readings from Last 3 Encounters:  06/02/18 177 lb (80.3 kg)  03/16/18 180 lb (81.6 kg)  03/23/17 172 lb (78 kg)    Physical Exam  Constitutional: Appropriate weight for height, not in acute distress, normal state of mind Eyes: PERRLA, EOMI, no exophthalmos ENT: moist mucous membranes, + healed scar on lower neck from thyroidectomy, + palpable left lobe of  the thyroid, no cervical lymphadenopathy Cardiovascular: normal precordial activity, Regular Rate and Rhythm, no Murmur/Rubs/Gallops Respiratory:  adequate breathing efforts, no gross chest deformity, Clear to auscultation bilaterally Gastrointestinal: abdomen soft, Non -tender, No distension, Bowel Sounds present Musculoskeletal: no gross deformities, strength intact in all four extremities Skin: moist, warm, no rashes Neurological: no tremor with outstretched hands, Deep tendon reflexes normal in all four extremities.  CMP ( most recent) CMP     Component Value Date/Time   NA 140 04/20/2017 0905   K 3.8 04/20/2017 0905   CL 107 04/20/2017 0905   CO2 25 04/20/2017 0905   GLUCOSE 86 04/20/2017 0905   BUN 10 04/20/2017 0905   CREATININE 0.90 04/20/2017 0905  CALCIUM 8.6 04/20/2017 0905   PROT 8.1 10/18/2015 2135   ALBUMIN 4.2 04/20/2017 0905   AST 18 10/18/2015 2135   ALT 16 10/18/2015 2135   ALKPHOS 67 10/18/2015 2135   BILITOT 0.4 10/18/2015 2135   GFRNONAA >60 10/04/2016 0024   GFRAA >60 10/04/2016 0024   Recent Results (from the past 2160 hour(s))  VITAMIN D 25 Hydroxy (Vit-D Deficiency, Fractures)     Status: Abnormal   Collection Time: 03/20/18  1:41 PM  Result Value Ref Range   Vit D, 25-Hydroxy 16.9 (L) 30.0 - 100.0 ng/mL    Comment: Vitamin D deficiency has been defined by the [place] practice guideline as a level of serum 25-OH vitamin D less than 20 ng/mL (1,2). The Endocrine Society went on to further define vitamin D insufficiency as a level between 21 and 29 ng/mL (2). 1. IOM (Institute of Medicine). 2010. Dietary reference    intakes for calcium and D. [place]: The    [employer]. 2. Holick MF, Binkley [place], Bischoff-Ferrari HA, et al.    Evaluation, treatment, and prevention of vitamin D    deficiency: an Endocrine Society clinical practice    guideline. JCEM. 2011 Jul; 96(7):1911-30.   Magnesium      Status: None   Collection Time: 03/20/18  1:41 PM  Result Value Ref Range   Magnesium 1.8 1.6 - 2.3 mg/dL  TSH     Status: None   Collection Time: 05/30/18  9:36 AM  Result Value Ref Range   TSH 0.87 mIU/L    Comment:           Reference Range .           > or = 20 Years  0.40-4.50 .                Pregnancy Ranges           First trimester    0.26-2.66           Second trimester   0.55-2.73           Third trimester    0.43-2.91   T4, Free     Status: None   Collection Time: 05/30/18  9:36 AM  Result Value Ref Range   Free T4 1.0 0.8 - 1.8 ng/dL  T3, Free     Status: None   Collection Time: 05/30/18  9:36 AM  Result Value Ref Range   T3, Free 3.0 2.3 - 4.2 pg/mL    Surgical sample of right hemithyroidectomy from 12/13/2016 showed a 5 cm nodule removed along with her right lobe of the thyroid which was 5.3 cm on the longest dimension. Pathology was negative for malignancy.   Assessment & Plan:   1. Subclinical hyperthyroidism-history of -Her repeat previsit thyroid function tests are within normal limits.  She will not require intervention with thyroid hormone supplement at this time.    She is also status post right hemithyroidectomy on 12/13/2016 with benign outcomes.  Due to her ongoing dysphagia as a complaint, she will be sent for surveillance thyroid ultrasound to be done as soon as possible. -Her electrolyte abnormalities including hypokalemia and hypocalcemia have been corrected.  -If her thyroid ultrasound is unremarkable, she may benefit from reevaluation by ENT or GI.  - I advised patient to maintain close follow up with Muse, Noel Journey., PA-C for primary care needs.  Follow up plan: Return in about 3 months (around 09/02/2018) for  Thyroid / Neck Ultrasound, Follow up with Pre-visit Labs.  Glade Lloyd, MD Phone: [phone]  Fax: [phone]   06/02/2018, 10:50 AM

## 2018-06-11 ENCOUNTER — Other Ambulatory Visit: Payer: Self-pay

## 2018-06-11 ENCOUNTER — Emergency Department (HOSPITAL_COMMUNITY)
Admission: EM | Admit: 2018-06-11 | Discharge: 2018-06-12 | Disposition: A | Discharge status: Home / Self Care | Payer: Medicaid Other | Attending: Emergency Medicine | Admitting: Emergency Medicine

## 2018-06-11 ENCOUNTER — Encounter (HOSPITAL_COMMUNITY): Payer: Self-pay | Admitting: *Deleted

## 2018-06-11 DIAGNOSIS — Z79899 Other long term (current) drug therapy: Secondary | ICD-10-CM | POA: Insufficient documentation

## 2018-06-11 DIAGNOSIS — R1110 Vomiting, unspecified: Secondary | ICD-10-CM | POA: Diagnosis present

## 2018-06-11 DIAGNOSIS — R198 Other specified symptoms and signs involving the digestive system and abdomen: Secondary | ICD-10-CM

## 2018-06-11 DIAGNOSIS — R1310 Dysphagia, unspecified: Secondary | ICD-10-CM | POA: Insufficient documentation

## 2018-06-11 DIAGNOSIS — R0989 Other specified symptoms and signs involving the circulatory and respiratory systems: Secondary | ICD-10-CM | POA: Diagnosis not present

## 2018-06-11 DIAGNOSIS — R1111 Vomiting without nausea: Secondary | ICD-10-CM

## 2018-06-11 DIAGNOSIS — N3000 Acute cystitis without hematuria: Secondary | ICD-10-CM | POA: Insufficient documentation

## 2018-06-11 LAB — CBC WITH DIFFERENTIAL/PLATELET
ABS IMMATURE GRANULOCYTES: 0.01 10*3/uL (ref 0.00–0.07)
Basophils Absolute: 0 10*3/uL (ref 0.0–0.1)
Basophils Relative: 0 %
EOS PCT: 2 %
Eosinophils Absolute: 0.1 10*3/uL (ref 0.0–0.5)
HEMATOCRIT: 40.3 % (ref 36.0–46.0)
HEMOGLOBIN: 12.9 g/dL (ref 12.0–15.0)
Immature Granulocytes: 0 %
LYMPHS PCT: 39 %
Lymphs Abs: 2.5 10*3/uL (ref 0.7–4.0)
MCH: 31.2 pg (ref 26.0–34.0)
MCHC: 32 g/dL (ref 30.0–36.0)
MCV: 97.3 fL (ref 80.0–100.0)
MONO ABS: 0.4 10*3/uL (ref 0.1–1.0)
Monocytes Relative: 6 %
NEUTROS ABS: 3.4 10*3/uL (ref 1.7–7.7)
Neutrophils Relative %: 53 %
Platelets: 279 10*3/uL (ref 150–400)
RBC: 4.14 MIL/uL (ref 3.87–5.11)
RDW: 11.9 % (ref 11.5–15.5)
WBC: 6.4 10*3/uL (ref 4.0–10.5)
nRBC: 0 % (ref 0.0–0.2)

## 2018-06-11 LAB — URINALYSIS, ROUTINE W REFLEX MICROSCOPIC
BILIRUBIN URINE: NEGATIVE
GLUCOSE, UA: NEGATIVE mg/dL
KETONES UR: NEGATIVE mg/dL
Nitrite: POSITIVE — AB
PH: 5 (ref 5.0–8.0)
Protein, ur: NEGATIVE mg/dL
Specific Gravity, Urine: 1.028 (ref 1.005–1.030)

## 2018-06-11 LAB — RAPID URINE DRUG SCREEN, HOSP PERFORMED
Amphetamines: NOT DETECTED
BARBITURATES: NOT DETECTED
BENZODIAZEPINES: NOT DETECTED
Cocaine: NOT DETECTED
OPIATES: NOT DETECTED
Tetrahydrocannabinol: NOT DETECTED

## 2018-06-11 LAB — I-STAT BETA HCG BLOOD, ED (MC, WL, AP ONLY): I-stat hCG, quantitative: <5 m[IU]/mL (ref <5)

## 2018-06-11 NOTE — ED Triage Notes (Signed)
Pt has been vomiting since this morning;

## 2018-06-12 ENCOUNTER — Emergency Department (HOSPITAL_COMMUNITY): Payer: Medicaid Other

## 2018-06-12 LAB — COMPREHENSIVE METABOLIC PANEL
ALBUMIN: 4.3 g/dL (ref 3.5–5.0)
ALT: 13 U/L (ref 0–44)
AST: 16 U/L (ref 15–41)
Alkaline Phosphatase: 49 U/L (ref 38–126)
Anion gap: 6 (ref 5–15)
BILIRUBIN TOTAL: 0.3 mg/dL (ref 0.3–1.2)
BUN: 13 mg/dL (ref 6–20)
CHLORIDE: 107 mmol/L (ref 98–111)
CO2: 26 mmol/L (ref 22–32)
CREATININE: 0.79 mg/dL (ref 0.44–1.00)
Calcium: 8.8 mg/dL — ABNORMAL LOW (ref 8.9–10.3)
GFR calc Af Amer: >60 mL/min (ref >60)
GLUCOSE: 85 mg/dL (ref 70–99)
POTASSIUM: 3 mmol/L — AB (ref 3.5–5.1)
Sodium: 139 mmol/L (ref 135–145)
Total Protein: 8 g/dL (ref 6.5–8.1)

## 2018-06-12 LAB — LIPASE, BLOOD: Lipase: 30 U/L (ref 11–51)

## 2018-06-12 MED ORDER — ONDANSETRON HCL 4 MG PO TABS: 4.0000 mg | ORAL_TABLET | Freq: Three times a day (TID) | ORAL | 0 refills | Status: DC | PRN

## 2018-06-12 MED ORDER — IOPAMIDOL (ISOVUE-300) INJECTION 61%
75.0000 mL | Freq: Once | INTRAVENOUS | Status: AC | PRN
  Administered 2018-06-12: 75 mL via INTRAVENOUS

## 2018-06-12 MED ORDER — FOSFOMYCIN TROMETHAMINE 3 G PO PACK
3.0000 g | PACK | Freq: Once | ORAL | Status: AC
  Administered 2018-06-12: 3 g via ORAL
  Filled 2018-06-12 (×2): qty 3

## 2018-06-12 NOTE — Discharge Instructions (Signed)
Get help right away if: You cannot swallow your saliva. You have shortness of breath or a fever, or both. You have a hoarse voice and also have trouble swallowing.

## 2018-06-12 NOTE — ED Provider Notes (Signed)
[facility] EMERGENCY DEPARTMENT Provider Note   CSN: [id number] Arrival date & time: 06/11/18  2057     History   Chief Complaint Chief Complaint  Patient presents with  . Emesis    HPI Linda Reid is a 32 y.o. female who presents for vomiting. The patient c/o globus sensation in her throat.  She states that when she is tries to eat solid food that sometimes she feels a tightening and then she vomits.  When she feels this in her throat she gets pain in her mid back.  Today the patient was trying to eat bacon and eggs this morning when she had that sensation and she vomited.  Each time she tried to eat solids this occurred.  When she drinks fluids she is able to keep it down.  She does have any belly pain or nausea.  Patient has a history of hemi-thyroidectomy on the right, she says that she had the same sensations prior to having her partial thyroid remove it.  She says that now she is feeling that sensation primarily on the left side of her throat.  She made an appointment with her endocrinologist to set her up for a thyroid ultrasound however at the first available appointment was mid January.  Patient states that she feels like she cannot wait for the next 3 months considering she is frequently having these episodes.  Today was her worst day.  She denies chest pain or shortness of breath.  HPI  Past Medical History:  Diagnosis Date  . Arthritis    lower back, right leg  . Back pain   . Cerebrospinal fluid leak from spinal puncture 03/03/2016   after epidural steroid injection; states has had 2 blood patch procedures   . Difficulty swallowing solids    certain foods  . Hyperthyroidism   . Migraines   . Nasal congestion 12/06/2016  . Neck pain of over 3 months duration    posterior and right side  . Neuropathy    right leg  . Restless leg syndrome   . Thyroid nodule 11/2016   right  . Tracheal deviation    due to thyroid nodule, per pt.  . Urinary frequency       Patient Active Problem List   Diagnosis Date Noted  . Vitamin D deficiency 06/02/2018  . Chronic pain syndrome 03/16/2018  . Hypokalemia 03/23/2017  . Hypocalcemia 03/23/2017  . Subclinical hyperthyroidism 03/16/2017  . S/P partial thyroidectomy 12/13/2016  . Nexplanon insertion 08/26/2015  . History of preterm delivery 08/26/2015  . History of chlamydia 08/26/2015  . Postpartum/situational depression 08/06/2015  . History of mastitis 07/14/2015  . Groin pain, chronic, right 07/14/2015    Past Surgical History:  Procedure Laterality Date  . DG MYLEOGRAM LUMBAR SPINE ([place] HX)    . DILATION AND CURETTAGE OF UTERUS  06/25/2002  . THYROIDECTOMY Right 12/13/2016   Procedure: RIGHT HEMI-THYROIDECTOMY;  Surgeon: Leta Baptist, MD;  Location: [facility];  Service: ENT;  Laterality: Right;  . TONSILLECTOMY       OB History    Gravida  6   Para  4   Term  1   Preterm  3   AB  2   Living  4     SAB  2   TAB  0   Ectopic  0   Multiple  0   Live Births  4            Home Medications  Prior to Admission medications   Medication Sig Start Date End Date Taking? Authorizing Provider  baclofen (LIORESAL) 10 MG tablet Take 10 mg by mouth 3 (three) times daily as needed for muscle spasms.    [provider]  Cholecalciferol (VITAMIN D3) 5000 units CAPS Take 1 capsule (5,000 Units total) by mouth daily. 06/02/18   Cassandria Anger, MD  DULoxetine (CYMBALTA) 30 MG capsule Take 1 capsule (30 mg total) by mouth at bedtime. 03/16/18   Jamse Arn, MD  etonogestrel (NEXPLANON) 68 MG IMPL implant 1 each by Subdermal route once.    [provider]  ondansetron (ZOFRAN) 4 MG tablet Take 1 tablet (4 mg total) by mouth every 8 (eight) hours as needed for nausea or vomiting. 06/12/18   Ayiana Winslett, Vernie Shanks, PA-C  tiZANidine (ZANAFLEX) 4 MG tablet Take 1 tablet (4 mg total) by mouth every 6 (six) hours as needed for muscle spasms. 04/26/17   Melvenia Beam, MD    Family History Family History  Problem Relation Age of Onset  . Cancer Maternal Grandmother        breast  . Diabetes Maternal Grandmother   . Hypertension Maternal Grandmother   . Heart disease Maternal Grandfather   . Hypertension Maternal Grandfather   . Hypertension Paternal Grandmother   . Hypertension Paternal Grandfather   . Premature birth Daughter   . Asthma Daughter   . Diabetes Maternal Aunt   . Hypertension Maternal Aunt   . Stroke Maternal Aunt   . Diabetes Maternal Uncle   . Hypertension Maternal Uncle   . Hypertension Paternal Aunt   . Hypertension Paternal Uncle     Social History Social History   Tobacco Use  . Smoking status: Never Smoker  . Smokeless tobacco: Never Used  Substance Use Topics  . Alcohol use: No  . Drug use: No     Allergies   Patient has no known allergies.   Review of Systems Review of Systems  Ten systems reviewed and are negative for acute change, except as noted in the HPI.   Physical Exam Updated Vital Signs BP 126/78 (BP Location: Right Arm)   Pulse 65   Temp 98.4 F (36.9 C) (Oral)   Resp 17   Ht 5\' 9"  (1.753 m)   Wt 80.7 kg   SpO2 100%   BMI 26.29 kg/m   Physical Exam  Constitutional: She is oriented to person, place, and time. She appears well-developed and well-nourished. No distress.  HENT:  Head: Normocephalic and atraumatic.  Eyes: Conjunctivae are normal. No scleral icterus.  Neck: Normal range of motion. No thyromegaly present.  Cardiovascular: Normal rate, regular rhythm and normal heart sounds. Exam reveals no gallop and no friction rub.  No murmur heard. Pulmonary/Chest: Effort normal and breath sounds normal. No respiratory distress.  Abdominal: Soft. Bowel sounds are normal. She exhibits no distension and no mass. There is no tenderness. There is no guarding.  Neurological: She is alert and oriented to person, place, and time.  Skin: Skin is warm and dry. She is not  diaphoretic.  Psychiatric: Her behavior is normal.  Nursing note and vitals reviewed.    ED Treatments / Results  Labs (all labs ordered are listed, but only abnormal results are displayed) Labs Reviewed  COMPREHENSIVE METABOLIC PANEL - Abnormal; Notable for the following components:      Result Value   Potassium 3.0 (*)    Calcium 8.8 (*)    All other components within normal  limits  URINALYSIS, ROUTINE W REFLEX MICROSCOPIC - Abnormal; Notable for the following components:   APPearance HAZY (*)    Hgb urine dipstick MODERATE (*)    Nitrite POSITIVE (*)    Leukocytes, UA TRACE (*)    Bacteria, UA MANY (*)    All other components within normal limits  URINE CULTURE  LIPASE, BLOOD  CBC WITH DIFFERENTIAL/PLATELET  RAPID URINE DRUG SCREEN, HOSP PERFORMED  I-STAT BETA HCG BLOOD, ED ([facility], [facility], [facility] ONLY)    EKG None  Radiology Ct Soft Tissue Neck W Contrast  Result Date: 06/12/2018 CLINICAL DATA:  Difficulty swallowing. History of thyroid surgery 1 year ago. EXAM: CT NECK WITH CONTRAST TECHNIQUE: Multidetector CT imaging of the neck was performed using the standard protocol following the bolus administration of intravenous contrast. CONTRAST:  15mL ISOVUE-300 IOPAMIDOL (ISOVUE-300) INJECTION 61% COMPARISON:  CT neck 10/04/2016 FINDINGS: PHARYNX AND LARYNX: --Nasopharynx: Fossae of Rosenmuller are clear. Normal adenoid tonsils for age. --Oral cavity and oropharynx: The palatine and lingual tonsils are normal. The visible oral cavity and floor of mouth are normal. --Hypopharynx: Normal vallecula and pyriform sinuses. --Larynx: Normal epiglottis and pre-epiglottic space. Normal aryepiglottic and vocal folds. --Retropharyngeal space: No abscess, effusion or lymphadenopathy. SALIVARY GLANDS: --Parotid: No mass lesion or inflammation. No sialolithiasis or ductal dilatation. --Submandibular: Symmetric without inflammation. No sialolithiasis or ductal dilatation. --Sublingual: Normal. No ranula or  other visible lesion of the base of tongue and floor of mouth. THYROID: Status post right thyroidectomy. Normal appearance of the left lobe. LYMPH NODES: No enlarged or abnormal density lymph nodes. VASCULAR: Major cervical vessels are patent. LIMITED INTRACRANIAL: Normal. VISUALIZED ORBITS: Normal. MASTOIDS AND VISUALIZED PARANASAL SINUSES: No fluid levels or advanced mucosal thickening. No mastoid effusion. SKELETON: No bony spinal canal stenosis. No lytic or blastic lesions. UPPER CHEST: Clear. OTHER: None. IMPRESSION: 1. No acute abnormality of the neck. 2. Status post right thyroidectomy. Normal appearance of the left thyroid lobe. Electronically Signed   By: Ulyses Jarred M.D.   On: 06/12/2018 01:05    Procedures Procedures (including critical care time)  Medications Ordered in ED Medications  iopamidol (ISOVUE-300) 61 % injection 75 mL (75 mLs Intravenous Contrast Given 06/12/18 0031)  fosfomycin (MONUROL) packet 3 g (3 g Oral Given 06/12/18 0157)     Initial Impression / Assessment and Plan / ED Course  I have reviewed the triage vital signs and the nursing notes.  Pertinent labs & imaging results that were available during my care of the patient were reviewed by me and considered in my medical decision making (see chart for details).     Patient appears to have a urinary tract infection.  She was given a single dose of fosfomycin for uncomplicated urinary tract infection in a young female.  Her labs are otherwise without significant abnormality and are reassuring.  Also reviewed the patient's CT scan.  She has a previous right-sided hemithyroidectomy however the left thyroid does not appear to have a enlarged.  I agree with the radiologic interpretation.  There are no other abnormalities on the scan.  Patient is advised to follow-up with gastroenterology for EGD.  She appears otherwise appropriate for discharge at this time  Final Clinical Impressions(s) / ED Diagnoses   Final  diagnoses:  Globus sensation  Acute cystitis without hematuria  Non-intractable vomiting without nausea, unspecified vomiting type  Dysphagia, unspecified type    ED Discharge Orders         Ordered    ondansetron (ZOFRAN) 4 MG  tablet  Every 8 hours PRN     06/12/18 0116           Margarita Mail, PA-C 06/13/18 0013    Rolland Porter, MD 06/20/18 [phone]

## 2018-06-14 LAB — URINE CULTURE: Culture: >=100000 — AB

## 2018-06-15 ENCOUNTER — Telehealth: Payer: Self-pay | Admitting: *Deleted

## 2018-06-15 NOTE — Telephone Encounter (Signed)
Post ED Visit - Positive Culture Follow-up  Culture report reviewed by antimicrobial stewardship pharmacist:  []  Elenor Quinones, Pharm.D. []  Heide Guile, Pharm.D., BCPS AQ-ID []  Parks Neptune, Pharm.D., BCPS []  Alycia Rossetti, Pharm.D., BCPS []  [place], Pharm.D., BCPS, AAHIVP []  Legrand Como, Pharm.D., BCPS, AAHIVP []  Salome Arnt, PharmD, BCPS []  Johnnette Gourd, PharmD, BCPS []  Hughes Better, PharmD, BCPS []  Leeroy Cha, PharmD Salome Arnt, PharmD  Positive urine culture Treated with Fosfomycin, organism sensitive to the same and no further patient follow-up is required at this time.  Harlon Flor [facility] 06/15/2018, 10:29 AM

## 2018-06-21 ENCOUNTER — Encounter: Payer: Self-pay | Admitting: Gastroenterology

## 2018-08-14 ENCOUNTER — Ambulatory Visit: Payer: Medicaid Other | Admitting: Gastroenterology

## 2018-08-14 ENCOUNTER — Encounter: Payer: Self-pay | Admitting: Gastroenterology

## 2018-08-14 ENCOUNTER — Telehealth: Payer: Self-pay

## 2018-08-14 ENCOUNTER — Other Ambulatory Visit: Payer: Self-pay

## 2018-08-14 VITALS — BP 110/74 | HR 97 | Temp 98.1°F | Ht 69.0 in | Wt 177.2 lb

## 2018-08-14 DIAGNOSIS — R0989 Other specified symptoms and signs involving the circulatory and respiratory systems: Secondary | ICD-10-CM

## 2018-08-14 DIAGNOSIS — R198 Other specified symptoms and signs involving the digestive system and abdomen: Secondary | ICD-10-CM | POA: Insufficient documentation

## 2018-08-14 DIAGNOSIS — R110 Nausea: Secondary | ICD-10-CM

## 2018-08-14 DIAGNOSIS — R1310 Dysphagia, unspecified: Secondary | ICD-10-CM

## 2018-08-14 MED ORDER — PANTOPRAZOLE SODIUM 40 MG PO TBEC: 40.0000 mg | DELAYED_RELEASE_TABLET | Freq: Every day | ORAL | 3 refills | Status: DC

## 2018-08-14 MED ORDER — PROMETHAZINE HCL 25 MG PO TABS: 12.5000 mg | ORAL_TABLET | Freq: Four times a day (QID) | ORAL | 0 refills | Status: DC | PRN

## 2018-08-14 NOTE — H&P (View-Only) (Signed)
Primary Care Physician:  Raiford Simmonds., PA-C Primary Gastroenterologist:  Dr. Gala Romney  Chief Complaint  Patient presents with  . Nausea    no recent vomiting  . Abdominal Cramping    comes/goes, more on left side  . Dysphagia    with eating/drinking and then it causes pains in center of back    HPI:   Linda Reid is a 32 y.o. female presenting today to establish care at the request of the ED. Seen Oct 20,2019 at [facility] with globus sensation, vomiting. CBC normal. LFTs normal.    Has history of right thyroidectomy in the past. Chronic symptoms worsening over past few months. Has associated N/V. No vomiting in about a month. Trying to change diet. Feels like food is getting stuck when swallowing. Has back pain right below shoulder blades with dysphagia. Regurgitation, nausea causes discomfort in back. No PPI currently. Globus sensation. Random pain left side of abdomen, intermittently. Not associated with eating/drinking. No relieving factors. Rare Ibuprofen.   ENT: Dr. Benjamine Mola. Last seen at time of thyroidectomy. Endocrinologist: Dr. Dorris Fetch.   Past Medical History:  Diagnosis Date  . Arthritis    lower back, right leg  . Back pain   . Cerebrospinal fluid leak from spinal puncture 03/03/2016   after epidural steroid injection; states has had 2 blood patch procedures   . Difficulty swallowing solids    certain foods  . Hyperthyroidism   . Migraines   . Nasal congestion 12/06/2016  . Neck pain of over 3 months duration    posterior and right side  . Neuropathy    right leg  . Restless leg syndrome   . Thyroid nodule 11/2016   right  . Tracheal deviation    due to thyroid nodule, per pt.  . Urinary frequency     Past Surgical History:  Procedure Laterality Date  . DG MYLEOGRAM LUMBAR SPINE ([place] HX)    . DILATION AND CURETTAGE OF UTERUS  06/25/2002  . THYROIDECTOMY Right 12/13/2016   Procedure: RIGHT HEMI-THYROIDECTOMY;  Surgeon: Leta Baptist, MD;  Location:  [place];  Service: ENT;  Laterality: Right;  . TONSILLECTOMY      Current Outpatient Medications  Medication Sig Dispense Refill  . baclofen (LIORESAL) 10 MG tablet Take 10 mg by mouth 3 (three) times daily as needed for muscle spasms.    . Cholecalciferol (VITAMIN D3) 5000 units CAPS Take 1 capsule (5,000 Units total) by mouth daily. 90 capsule 0  . DULoxetine (CYMBALTA) 30 MG capsule Take 1 capsule (30 mg total) by mouth at bedtime. 30 capsule 1  . etonogestrel (NEXPLANON) 68 MG IMPL implant 1 each by Subdermal route once.    Marland Kitchen tiZANidine (ZANAFLEX) 4 MG tablet Take 1 tablet (4 mg total) by mouth every 6 (six) hours as needed for muscle spasms. 30 tablet 0  . pantoprazole (PROTONIX) 40 MG tablet Take 1 tablet (40 mg total) by mouth daily. 30 minutes before breakfast daily. 30 tablet 3  . promethazine (PHENERGAN) 25 MG tablet Take 0.5 tablets (12.5 mg total) by mouth every 6 (six) hours as needed for nausea or vomiting. 30 tablet 0   Current Facility-Administered Medications  Medication Dose Route Frequency Provider Last Rate Last Dose  . triamcinolone acetonide (KENALOG) 10 MG/ML injection 10 mg  10 mg Other Once Landis Martins, DPM        Allergies as of 08/14/2018  . (No Known Allergies)    Family History  Problem  Relation Age of Onset  . Cancer Maternal Grandmother        breast  . Diabetes Maternal Grandmother   . Hypertension Maternal Grandmother   . Heart disease Maternal Grandfather   . Hypertension Maternal Grandfather   . Hypertension Paternal Grandmother   . Hypertension Paternal Grandfather   . Premature birth Daughter   . Asthma Daughter   . Diabetes Maternal Aunt   . Hypertension Maternal Aunt   . Stroke Maternal Aunt   . Diabetes Maternal Uncle   . Hypertension Maternal Uncle   . Hypertension Paternal Aunt   . Hypertension Paternal Uncle   . Colon cancer Neg Hx   . Colon polyps Neg Hx     Social History   Socioeconomic History  .  Marital status: Married    Spouse name: Psychiatrist   . Number of children: 3  . Years of education: 4 Bachelor  . Highest education level: Not on file  Occupational History  . 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 Financial resource strain: Not on file  . Food insecurity:    Worry: Not on file    Inability: Not on file  . Transportation needs:    Medical: Not on file    Non-medical: Not on file  Tobacco Use  . Smoking status: Never Smoker  . Smokeless tobacco: Never Used  Substance and Sexual Activity  . Alcohol use: No  . Drug use: No  . Sexual activity: Yes    Birth control/protection: Implant  Lifestyle  . Physical activity:    Days per week: Not on file    Minutes per session: Not on file  . Stress: Not on file  Relationships  . Social connections:    Talks on phone: Not on file    Gets together: Not on file    Attends religious service: Not on file    Active member of club or organization: Not on file    Attends meetings of clubs or organizations: Not on file    Relationship status: Not on file  . Intimate partner violence:    Fear of current or ex partner: Not on file    Emotionally abused: Not on file    Physically abused: Not on file    Forced sexual activity: Not on file  Other Topics Concern  . Not on file  Social History Narrative   Patient lives with her fiance and 3 children.    Her fiance is the father of her current pregnancy, other three children were fathered by her previous partner.    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     Caffeine use: none   Right-handed    Review of Systems: Gen: Denies any fever, chills, fatigue, weight loss, lack of appetite.  CV: Denies chest pain, heart palpitations, peripheral edema, syncope.  Resp: Denies shortness of breath at rest  or with exertion. Denies wheezing or cough.  GI: see HPI  GU : Denies urinary burning, urinary frequency, urinary hesitancy MS: Denies joint pain, muscle weakness, cramps, or limitation of movement.  Derm: Denies rash, itching, dry skin Psych: Denies depression, anxiety, memory loss, and confusion Heme: Denies bruising, bleeding, and enlarged lymph nodes.  Physical Exam: BP 110/74   Pulse 97   Temp 98.1 F (36.7 C) (  Oral)   Ht 5\' 9"  (1.753 m)   Wt 177 lb 3.2 oz (80.4 kg)   BMI 26.17 kg/m  General:   Alert and oriented. Pleasant and cooperative. Well-nourished and well-developed.  Head:  Normocephalic and atraumatic. Eyes:  Without icterus, sclera clear and conjunctiva pink.  Ears:  Normal auditory acuity. Nose:  No deformity, discharge,  or lesions. Mouth:  No deformity or lesions, oral mucosa pink.  Lungs:  Clear to auscultation bilaterally.  Heart:  S1, S2 present without murmurs appreciated.  Abdomen:  +BS, soft, mild TTP epigastric and non-distended. No HSM noted. No guarding or rebound. No masses appreciated.  Rectal:  Deferred  Msk:  Symmetrical without gross deformities. Normal posture. Extremities:  Without edema. Neurologic:  Alert and  oriented x4 Skin:  Intact without significant lesions or rashes. Psych:  Alert and cooperative. Normal mood and affect.

## 2018-08-14 NOTE — Patient Instructions (Signed)
We have arranged an upper endoscopy with dilation in the near future by Dr. Gala Romney.  I have sent in Protonix to take in the mornings, 30 minutes before breakfast daily. It is best absorbed on an empty stomach. This is to treat reflux.   I sent in Phenergan to the pharmacy for significant nausea. This can cause drowsiness and sedation. Do not take while driving. I would start with 1/2 tablet. You may take a whole tablet for severe nausea/vomiting.  We will see you back in 4 months!  It was a pleasure to see you today. I strive to create trusting relationships with patients to provide genuine, compassionate, and quality care. I value your feedback. If you receive a survey regarding your visit,  I greatly appreciate you taking time to fill this out.   Annitta Needs, PhD, ANP-BC [facility]

## 2018-08-14 NOTE — Assessment & Plan Note (Signed)
32 year old female with globus sensation and intermittent solid food dysphagia chronically, vague left-sided abdominal pain with no prior EGD. Currently not taking a PPI. Has previously been evaluated by ENT in the past. Will start PPI and pursue EGD/dilation in the near future.  Proceed with upper endoscopy/dilation in the near future with Dr. Gala Romney. The risks, benefits, and alternatives have been discussed in detail with patient. They have stated understanding and desire to proceed.  Propofol Start Protonix once daily

## 2018-08-14 NOTE — Assessment & Plan Note (Signed)
Vague nausea but vomiting now resolved. PPI treatment started empirically, as some of her symptoms may be due to uncontrolled GERD. Return in 4 months.

## 2018-08-14 NOTE — Progress Notes (Signed)
Primary Care Physician:  Raiford Simmonds., PA-C Primary Gastroenterologist:  Dr. Gala Romney  Chief Complaint  Patient presents with  . Nausea    no recent vomiting  . Abdominal Cramping    comes/goes, more on left side  . Dysphagia    with eating/drinking and then it causes pains in center of back    HPI:   Linda Reid is a 32 y.o. female presenting today to establish care at the request of the ED. Seen Oct 20,2019 at [facility] with globus sensation, vomiting. CBC normal. LFTs normal.    Has history of right thyroidectomy in the past. Chronic symptoms worsening over past few months. Has associated N/V. No vomiting in about a month. Trying to change diet. Feels like food is getting stuck when swallowing. Has back pain right below shoulder blades with dysphagia. Regurgitation, nausea causes discomfort in back. No PPI currently. Globus sensation. Random pain left side of abdomen, intermittently. Not associated with eating/drinking. No relieving factors. Rare Ibuprofen.   ENT: Dr. Benjamine Mola. Last seen at time of thyroidectomy. Endocrinologist: Dr. Dorris Fetch.   Past Medical History:  Diagnosis Date  . Arthritis    lower back, right leg  . Back pain   . Cerebrospinal fluid leak from spinal puncture 03/03/2016   after epidural steroid injection; states has had 2 blood patch procedures   . Difficulty swallowing solids    certain foods  . Hyperthyroidism   . Migraines   . Nasal congestion 12/06/2016  . Neck pain of over 3 months duration    posterior and right side  . Neuropathy    right leg  . Restless leg syndrome   . Thyroid nodule 11/2016   right  . Tracheal deviation    due to thyroid nodule, per pt.  . Urinary frequency     Past Surgical History:  Procedure Laterality Date  . DG MYLEOGRAM LUMBAR SPINE ([place] HX)    . DILATION AND CURETTAGE OF UTERUS  06/25/2002  . THYROIDECTOMY Right 12/13/2016   Procedure: RIGHT HEMI-THYROIDECTOMY;  Surgeon: Leta Baptist, MD;  Location:  [place];  Service: ENT;  Laterality: Right;  . TONSILLECTOMY      Current Outpatient Medications  Medication Sig Dispense Refill  . baclofen (LIORESAL) 10 MG tablet Take 10 mg by mouth 3 (three) times daily as needed for muscle spasms.    . Cholecalciferol (VITAMIN D3) 5000 units CAPS Take 1 capsule (5,000 Units total) by mouth daily. 90 capsule 0  . DULoxetine (CYMBALTA) 30 MG capsule Take 1 capsule (30 mg total) by mouth at bedtime. 30 capsule 1  . etonogestrel (NEXPLANON) 68 MG IMPL implant 1 each by Subdermal route once.    Marland Kitchen tiZANidine (ZANAFLEX) 4 MG tablet Take 1 tablet (4 mg total) by mouth every 6 (six) hours as needed for muscle spasms. 30 tablet 0  . pantoprazole (PROTONIX) 40 MG tablet Take 1 tablet (40 mg total) by mouth daily. 30 minutes before breakfast daily. 30 tablet 3  . promethazine (PHENERGAN) 25 MG tablet Take 0.5 tablets (12.5 mg total) by mouth every 6 (six) hours as needed for nausea or vomiting. 30 tablet 0   Current Facility-Administered Medications  Medication Dose Route Frequency Provider Last Rate Last Dose  . triamcinolone acetonide (KENALOG) 10 MG/ML injection 10 mg  10 mg Other Once Landis Martins, DPM        Allergies as of 08/14/2018  . (No Known Allergies)    Family History  Problem  Relation Age of Onset  . Cancer Maternal Grandmother        breast  . Diabetes Maternal Grandmother   . Hypertension Maternal Grandmother   . Heart disease Maternal Grandfather   . Hypertension Maternal Grandfather   . Hypertension Paternal Grandmother   . Hypertension Paternal Grandfather   . Premature birth Daughter   . Asthma Daughter   . Diabetes Maternal Aunt   . Hypertension Maternal Aunt   . Stroke Maternal Aunt   . Diabetes Maternal Uncle   . Hypertension Maternal Uncle   . Hypertension Paternal Aunt   . Hypertension Paternal Uncle   . Colon cancer Neg Hx   . Colon polyps Neg Hx     Social History   Socioeconomic History  .  Marital status: Married    Spouse name: Psychiatrist   . Number of children: 3  . Years of education: 4 Bachelor  . Highest education level: Not on file  Occupational History  . 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 Financial resource strain: Not on file  . Food insecurity:    Worry: Not on file    Inability: Not on file  . Transportation needs:    Medical: Not on file    Non-medical: Not on file  Tobacco Use  . Smoking status: Never Smoker  . Smokeless tobacco: Never Used  Substance and Sexual Activity  . Alcohol use: No  . Drug use: No  . Sexual activity: Yes    Birth control/protection: Implant  Lifestyle  . Physical activity:    Days per week: Not on file    Minutes per session: Not on file  . Stress: Not on file  Relationships  . Social connections:    Talks on phone: Not on file    Gets together: Not on file    Attends religious service: Not on file    Active member of club or organization: Not on file    Attends meetings of clubs or organizations: Not on file    Relationship status: Not on file  . Intimate partner violence:    Fear of current or ex partner: Not on file    Emotionally abused: Not on file    Physically abused: Not on file    Forced sexual activity: Not on file  Other Topics Concern  . Not on file  Social History Narrative   Patient lives with her fiance and 3 children.    Her fiance is the father of her current pregnancy, other three children were fathered by her previous partner.    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     Caffeine use: none   Right-handed    Review of Systems: Gen: Denies any fever, chills, fatigue, weight loss, lack of appetite.  CV: Denies chest pain, heart palpitations, peripheral edema, syncope.  Resp: Denies shortness of breath at rest  or with exertion. Denies wheezing or cough.  GI: see HPI  GU : Denies urinary burning, urinary frequency, urinary hesitancy MS: Denies joint pain, muscle weakness, cramps, or limitation of movement.  Derm: Denies rash, itching, dry skin Psych: Denies depression, anxiety, memory loss, and confusion Heme: Denies bruising, bleeding, and enlarged lymph nodes.  Physical Exam: BP 110/74   Pulse 97   Temp 98.1 F (36.7 C) (  Oral)   Ht 5\' 9"  (1.753 m)   Wt 177 lb 3.2 oz (80.4 kg)   BMI 26.17 kg/m  General:   Alert and oriented. Pleasant and cooperative. Well-nourished and well-developed.  Head:  Normocephalic and atraumatic. Eyes:  Without icterus, sclera clear and conjunctiva pink.  Ears:  Normal auditory acuity. Nose:  No deformity, discharge,  or lesions. Mouth:  No deformity or lesions, oral mucosa pink.  Lungs:  Clear to auscultation bilaterally.  Heart:  S1, S2 present without murmurs appreciated.  Abdomen:  +BS, soft, mild TTP epigastric and non-distended. No HSM noted. No guarding or rebound. No masses appreciated.  Rectal:  Deferred  Msk:  Symmetrical without gross deformities. Normal posture. Extremities:  Without edema. Neurologic:  Alert and  oriented x4 Skin:  Intact without significant lesions or rashes. Psych:  Alert and cooperative. Normal mood and affect.

## 2018-08-14 NOTE — Telephone Encounter (Signed)
Called and informed pt of pre-op appt 08/24/18 at 3:15pm. Letter mailed.

## 2018-08-15 NOTE — Progress Notes (Signed)
CC'D TO PCP °

## 2018-08-22 NOTE — Patient Instructions (Signed)
Linda Reid  08/22/2018     @PREFPERIOPPHARMACY @   Your procedure is scheduled on  08/28/2018 .  Report to [facility] at  1115  A.M.  Call this number if you have problems the morning of surgery:  [phone]   Remember:  Follow the diet and prep instructions given to you by Dr Roseanne Kaufman office.                   Take these medicines the morning of surgery with A SIP OF WATER  Baclofen or zanaflex, (if needed), cymbalta, protonix, phenergan ( if needed).    Do not wear jewelry, make-up or nail polish.  Do not wear lotions, powders, or perfumes, or deodorant.  Do not shave 48 hours prior to surgery.  Men may shave face and neck.  Do not bring valuables to the hospital.  [facility] is not responsible for any belongings or valuables.  Contacts, dentures or bridgework may not be worn into surgery.  Leave your suitcase in the car.  After surgery it may be brought to your room.  For patients admitted to the hospital, discharge time will be determined by your treatment team.  Patients discharged the day of surgery will not be allowed to drive home.   Name and phone number of your driver:   family Special instructions:  None  Please read over the following fact sheets that you were given. Anesthesia Post-op Instructions and Care and Recovery After Surgery       Upper Endoscopy, Adult Upper endoscopy is a procedure to look inside the upper GI (gastrointestinal) tract. The upper GI tract is made up of:  The part of the body that moves food from your mouth to your stomach (esophagus).  The stomach.  The first part of your small intestine (duodenum). This procedure is also called esophagogastroduodenoscopy (EGD) or gastroscopy. In this procedure, your health care provider passes a thin, flexible tube (endoscope) through your mouth and down your esophagus into your stomach. A small camera is attached to the end of the tube. Images from the camera  appear on a monitor in the exam room. During this procedure, your health care provider may also remove a small piece of tissue to be sent to a lab and examined under a microscope (biopsy). Your health care provider may do an upper endoscopy to diagnose cancers of the upper GI tract. You may also have this procedure to find the cause of other conditions, such as:  Stomach pain.  Heartburn.  Pain or problems when swallowing.  Nausea and vomiting.  Stomach bleeding.  Stomach ulcers. Tell a health care provider about:  Any allergies you have.  All medicines you are taking, including vitamins, herbs, eye drops, creams, and over-the-counter medicines.  Any problems you or family members have had with anesthetic medicines.  Any blood disorders you have.  Any surgeries you have had.  Any medical conditions you have.  Whether you are pregnant or may be pregnant. What are the risks? Generally, this is a safe procedure. However, problems may occur, including:  Infection.  Bleeding.  Allergic reactions to medicines.  A tear or hole (perforation) in the esophagus, stomach, or duodenum. What happens before the procedure? Staying hydrated Follow instructions from your health care provider about hydration, which may include:  Up to 2 hours before the procedure - you may continue to drink clear liquids, such as water, clear  fruit juice, black coffee, and plain tea.  Eating and drinking restrictions Follow instructions from your health care provider about eating and drinking, which may include:  8 hours before the procedure - stop eating heavy meals or foods, such as meat, fried foods, or fatty foods.  6 hours before the procedure - stop eating light meals or foods, such as toast or cereal.  6 hours before the procedure - stop drinking milk or drinks that contain milk.  2 hours before the procedure - stop drinking clear liquids. Medicines Ask your health care provider  about:  Changing or stopping your regular medicines. This is especially important if you are taking diabetes medicines or blood thinners.  Taking medicines such as aspirin and ibuprofen. These medicines can thin your blood. Do not take these medicines unless your health care provider tells you to take them.  Taking over-the-counter medicines, vitamins, herbs, and supplements. General instructions  Plan to have someone take you home from the hospital or clinic.  If you will be going home right after the procedure, plan to have someone with you for 24 hours.  Ask your health care provider what steps will be taken to help prevent infection. What happens during the procedure?   An IV will be inserted into one of your veins.  You may be given one or more of the following: ? A medicine to help you relax (sedative). ? A medicine to numb the throat (local anesthetic).  You will lie on your left side on an exam table.  Your health care provider will pass the endoscope through your mouth and down your esophagus.  Your health care provider will use the scope to check the inside of your esophagus, stomach, and duodenum. Biopsies may be taken.  The endoscope will be removed. The procedure may vary among health care providers and hospitals. What happens after the procedure?  Your blood pressure, heart rate, breathing rate, and blood oxygen level will be monitored until you leave the hospital or clinic.  Do not drive for 24 hours if you were given a sedative during your procedure.  When your throat is no longer numb, you may be given some fluids to drink.  It is up to you to get the results of your procedure. Ask your health care provider, or the department that is doing the procedure, when your results will be ready. Summary  Upper endoscopy is a procedure to look inside the upper GI tract.  During the procedure, an IV will be inserted into one of your veins. You may be given a medicine  to help you relax.  A medicine will be used to numb your throat.  The endoscope will be passed through your mouth and down your esophagus. This information is not intended to replace advice given to you by your health care provider. Make sure you discuss any questions you have with your health care provider. Document Released: 08/06/2000 Document Revised: 01/09/2018 Document Reviewed: 01/09/2018 Elsevier Interactive Patient Education  2019 [place] [facility], Adult, Care After This sheet gives you information about how to care for yourself after your procedure. Your health care provider may also give you more specific instructions. If you have problems or questions, contact your health care provider. What can I expect after the procedure? After the procedure, it is common to have:  A sore throat.  Mild stomach pain or discomfort.  Bloating.  Nausea. Follow these instructions at home:   Follow instructions from your health  care provider about what to eat or drink after your procedure.  Return to your normal activities as told by your health care provider. Ask your health care provider what activities are safe for you.  Take over-the-counter and prescription medicines only as told by your health care provider.  Do not drive for 24 hours if you were given a sedative during your procedure.  Keep all follow-up visits as told by your health care provider. This is important. Contact a health care provider if you have:  A sore throat that lasts longer than one day.  Trouble swallowing. Get help right away if:  You vomit blood or your vomit looks like coffee grounds.  You have: ? A fever. ? Bloody, black, or tarry stools. ? A severe sore throat or you cannot swallow. ? Difficulty breathing. ? Severe pain in your chest or abdomen. Summary  After the procedure, it is common to have a sore throat, mild stomach discomfort, bloating, and nausea.  Do not drive for 24  hours if you were given a sedative during the procedure.  Follow instructions from your health care provider about what to eat or drink after your procedure.  Return to your normal activities as told by your health care provider. This information is not intended to replace advice given to you by your health care provider. Make sure you discuss any questions you have with your health care provider. Document Released: 02/08/2012 Document Revised: 01/09/2018 Document Reviewed: 01/09/2018 Elsevier Interactive Patient Education  2019 [employer]  Esophageal Dilatation Esophageal dilatation, also called esophageal dilation, is a procedure to widen or open (dilate) a blocked or narrowed part of the esophagus. The esophagus is the part of the body that moves food and liquid from the mouth to the stomach. You may need this procedure if:  You have a buildup of scar tissue in your esophagus that makes it difficult, painful, or impossible to swallow. This can be caused by gastroesophageal reflux disease (GERD).  You have cancer of the esophagus.  There is a problem with how food moves through your esophagus. In some cases, you may need this procedure repeated at a later time to dilate the esophagus gradually. Tell a health care provider about:  Any allergies you have.  All medicines you are taking, including vitamins, herbs, eye drops, creams, and over-the-counter medicines.  Any problems you or family members have had with anesthetic medicines.  Any blood disorders you have.  Any surgeries you have had.  Any medical conditions you have.  Any antibiotic medicines you are required to take before dental procedures.  Whether you are pregnant or may be pregnant. What are the risks? Generally, this is a safe procedure. However, problems may occur, including:  Bleeding due to a tear in the lining of the esophagus.  A hole (perforation) in the esophagus. What happens before the  procedure?  Follow instructions from your health care provider about eating or drinking restrictions.  Ask your health care provider about changing or stopping your regular medicines. This is especially important if you are taking diabetes medicines or blood thinners.  Plan to have someone take you home from the hospital or clinic.  Plan to have a responsible adult care for you for at least 24 hours after you leave the hospital or clinic. This is important. What happens during the procedure?  You may be given a medicine to help you relax (sedative).  A numbing medicine may be sprayed into the back of your  throat, or you may gargle the medicine.  Your health care provider may perform the dilatation using various surgical instruments, such as: ? Simple dilators. This instrument is carefully placed in the esophagus to stretch it. ? Guided wire bougies. This involves using an endoscope to insert a wire into the esophagus. A dilator is passed over this wire to enlarge the esophagus. Then the wire is removed. ? Balloon dilators. An endoscope with a small balloon at the end is inserted into the esophagus. The balloon is inflated to stretch the esophagus and open it up. The procedure may vary among health care providers and hospitals. What happens after the procedure?  Your blood pressure, heart rate, breathing rate, and blood oxygen level will be monitored until the medicines you were given have worn off.  Your throat may feel slightly sore and numb. This will improve slowly over time.  You will not be allowed to eat or drink until your throat is no longer numb.  When you are able to drink, urinate, and sit on the edge of the bed without nausea or dizziness, you may be able to return home. Follow these instructions at home:  Take over-the-counter and prescription medicines only as told by your health care provider.  Do not drive for 24 hours if you were given a sedative during your  procedure.  You should have a responsible adult with you for 24 hours after the procedure.  Follow instructions from your health care provider about any eating or drinking restrictions.  Do not use any products that contain nicotine or tobacco, such as cigarettes and e-cigarettes. If you need help quitting, ask your health care provider.  Keep all follow-up visits as told by your health care provider. This is important. Get help right away if you:  Have a fever.  Have chest pain.  Have pain that is not relieved by medication.  Have trouble breathing.  Have trouble swallowing.  Vomit blood. Summary  Esophageal dilatation, also called esophageal dilation, is a procedure to widen or open (dilate) a blocked or narrowed part of the esophagus.  Plan to have someone take you home from the hospital or clinic.  For this procedure, a numbing medicine may be sprayed into the back of your throat, or you may gargle the medicine.  Do not drive for 24 hours if you were given a sedative during your procedure. This information is not intended to replace advice given to you by your health care provider. Make sure you discuss any questions you have with your health care provider. Document Released: 09/30/2005 Document Revised: 06/14/2017 Document Reviewed: 06/14/2017 Elsevier Interactive Patient Education  2019 [place] Anesthesia is a term that refers to techniques, procedures, and medicines that help a person stay safe and comfortable during a medical procedure. Monitored anesthesia care, or sedation, is one type of anesthesia. Your anesthesia specialist may recommend sedation if you will be having a procedure that does not require you to be unconscious, such as:  Cataract surgery.  A dental procedure.  A biopsy.  A colonoscopy. During the procedure, you may receive a medicine to help you relax (sedative). There are three levels of sedation:  Mild  sedation. At this level, you may feel awake and relaxed. You will be able to follow directions.  Moderate sedation. At this level, you will be sleepy. You may not remember the procedure.  Deep sedation. At this level, you will be asleep. You will not remember the procedure.  The more medicine you are given, the deeper your level of sedation will be. Depending on how you respond to the procedure, the anesthesia specialist may change your level of sedation or the type of anesthesia to fit your needs. An anesthesia specialist will monitor you closely during the procedure. Let your health care provider know about:  Any allergies you have.  All medicines you are taking, including vitamins, herbs, eye drops, creams, and over-the-counter medicines.  Any use of steroids (by mouth or as a cream).  Any problems you or family members have had with sedatives and anesthetic medicines.  Any blood disorders you have.  Any surgeries you have had.  Any medical conditions you have, such as sleep apnea.  Whether you are pregnant or may be pregnant.  Any use of cigarettes, alcohol, or street drugs. What are the risks? Generally, this is a safe procedure. However, problems may occur, including:  Getting too much medicine (oversedation).  Nausea.  Allergic reaction to medicines.  Trouble breathing. If this happens, a breathing tube may be used to help with breathing. It will be removed when you are awake and breathing on your own.  Heart trouble.  Lung trouble. Before the procedure Staying hydrated Follow instructions from your health care provider about hydration, which may include:  Up to 2 hours before the procedure - you may continue to drink clear liquids, such as water, clear fruit juice, black coffee, and plain tea. Eating and drinking restrictions Follow instructions from your health care provider about eating and drinking, which may include:  8 hours before the procedure - stop  eating heavy meals or foods such as meat, fried foods, or fatty foods.  6 hours before the procedure - stop eating light meals or foods, such as toast or cereal.  6 hours before the procedure - stop drinking milk or drinks that contain milk.  2 hours before the procedure - stop drinking clear liquids. Medicines Ask your health care provider about:  Changing or stopping your regular medicines. This is especially important if you are taking diabetes medicines or blood thinners.  Taking medicines such as aspirin and ibuprofen. These medicines can thin your blood. Do not take these medicines before your procedure if your health care provider instructs you not to. Tests and exams  You will have a physical exam.  You may have blood tests done to show: ? How well your kidneys and liver are working. ? How well your blood can clot. General instructions  Plan to have someone take you home from the hospital or clinic.  If you will be going home right after the procedure, plan to have someone with you for 24 hours.  What happens during the procedure?  Your blood pressure, heart rate, breathing, level of pain and overall condition will be monitored.  An IV tube will be inserted into one of your veins.  Your anesthesia specialist will give you medicines as needed to keep you comfortable during the procedure. This may mean changing the level of sedation.  The procedure will be performed. After the procedure  Your blood pressure, heart rate, breathing rate, and blood oxygen level will be monitored until the medicines you were given have worn off.  Do not drive for 24 hours if you received a sedative.  You may: ? Feel sleepy, clumsy, or nauseous. ? Feel forgetful about what happened after the procedure. ? Have a sore throat if you had a breathing tube during the procedure. ? Vomit. This  information is not intended to replace advice given to you by your health care provider. Make sure you  discuss any questions you have with your health care provider. Document Released: 05/05/2005 Document Revised: 01/16/2016 Document Reviewed: 11/30/2015 Elsevier Interactive Patient Education  2019 [place], Care After These instructions provide you with information about caring for yourself after your procedure. Your health care provider may also give you more specific instructions. Your treatment has been planned according to current medical practices, but problems sometimes occur. Call your health care provider if you have any problems or questions after your procedure. What can I expect after the procedure? After your procedure, you may:  Feel sleepy for several hours.  Feel clumsy and have poor balance for several hours.  Feel forgetful about what happened after the procedure.  Have poor judgment for several hours.  Feel nauseous or vomit.  Have a sore throat if you had a breathing tube during the procedure. Follow these instructions at home: For at least 24 hours after the procedure:      Have a responsible adult stay with you. It is important to have someone help care for you until you are awake and alert.  Rest as needed.  Do not: ? Participate in activities in which you could fall or become injured. ? Drive. ? Use heavy machinery. ? Drink alcohol. ? Take sleeping pills or medicines that cause drowsiness. ? Make important decisions or sign legal documents. ? Take care of children on your own. Eating and drinking  Follow the diet that is recommended by your health care provider.  If you vomit, drink water, juice, or soup when you can drink without vomiting.  Make sure you have little or no nausea before eating solid foods. General instructions  Take over-the-counter and prescription medicines only as told by your health care provider.  If you have sleep apnea, surgery and certain medicines can increase your risk for breathing  problems. Follow instructions from your health care provider about wearing your sleep device: ? Anytime you are sleeping, including during daytime naps. ? While taking prescription pain medicines, sleeping medicines, or medicines that make you drowsy.  If you smoke, do not smoke without supervision.  Keep all follow-up visits as told by your health care provider. This is important. Contact a health care provider if:  You keep feeling nauseous or you keep vomiting.  You feel light-headed.  You develop a rash.  You have a fever. Get help right away if:  You have trouble breathing. Summary  For several hours after your procedure, you may feel sleepy and have poor judgment.  Have a responsible adult stay with you for at least 24 hours or until you are awake and alert. This information is not intended to replace advice given to you by your health care provider. Make sure you discuss any questions you have with your health care provider. Document Released: 11/30/2015 Document Revised: 03/25/2017 Document Reviewed: 11/30/2015 Elsevier Interactive Patient Education  2019 [employer].

## 2018-08-24 ENCOUNTER — Other Ambulatory Visit: Payer: Self-pay | Admitting: "Endocrinology

## 2018-08-24 ENCOUNTER — Encounter (HOSPITAL_COMMUNITY)
Admission: RE | Admit: 2018-08-24 | Discharge: 2018-08-24 | Disposition: A | Discharge status: Home / Self Care | Payer: Medicaid Other | Source: Ambulatory Visit | Attending: Internal Medicine | Admitting: Internal Medicine

## 2018-08-24 ENCOUNTER — Encounter (HOSPITAL_COMMUNITY): Payer: Self-pay

## 2018-08-24 ENCOUNTER — Other Ambulatory Visit: Payer: Self-pay

## 2018-08-24 DIAGNOSIS — Z01812 Encounter for preprocedural laboratory examination: Secondary | ICD-10-CM | POA: Insufficient documentation

## 2018-08-24 HISTORY — DX: Anxiety disorder, unspecified: F41.9

## 2018-08-24 HISTORY — DX: Gastro-esophageal reflux disease without esophagitis: K21.9

## 2018-08-24 LAB — CBC WITH DIFFERENTIAL/PLATELET
Abs Immature Granulocytes: 0.02 10*3/uL (ref 0.00–0.07)
Basophils Absolute: 0 10*3/uL (ref 0.0–0.1)
Basophils Relative: 0 %
EOS ABS: 0.1 10*3/uL (ref 0.0–0.5)
Eosinophils Relative: 2 %
HEMATOCRIT: 41.8 % (ref 36.0–46.0)
Hemoglobin: 13.4 g/dL (ref 12.0–15.0)
IMMATURE GRANULOCYTES: 0 %
Lymphocytes Relative: 26 %
Lymphs Abs: 1.4 10*3/uL (ref 0.7–4.0)
MCH: 31 pg (ref 26.0–34.0)
MCHC: 32.1 g/dL (ref 30.0–36.0)
MCV: 96.8 fL (ref 80.0–100.0)
MONOS PCT: 7 %
Monocytes Absolute: 0.4 10*3/uL (ref 0.1–1.0)
NEUTROS PCT: 65 %
NRBC: 0 % (ref 0.0–0.2)
Neutro Abs: 3.5 10*3/uL (ref 1.7–7.7)
Platelets: 230 10*3/uL (ref 150–400)
RBC: 4.32 MIL/uL (ref 3.87–5.11)
RDW: 11.9 % (ref 11.5–15.5)
WBC: 5.4 10*3/uL (ref 4.0–10.5)

## 2018-08-24 LAB — BASIC METABOLIC PANEL
ANION GAP: 4 — AB (ref 5–15)
BUN: 9 mg/dL (ref 6–20)
CALCIUM: 8.7 mg/dL — AB (ref 8.9–10.3)
CO2: 27 mmol/L (ref 22–32)
CREATININE: 0.71 mg/dL (ref 0.44–1.00)
Chloride: 107 mmol/L (ref 98–111)
GFR calc Af Amer: >60 mL/min (ref >60)
GLUCOSE: 97 mg/dL (ref 70–99)
Potassium: 3.7 mmol/L (ref 3.5–5.1)
Sodium: 138 mmol/L (ref 135–145)

## 2018-08-24 LAB — HCG, SERUM, QUALITATIVE: Preg, Serum: NEGATIVE

## 2018-08-28 ENCOUNTER — Encounter (HOSPITAL_COMMUNITY)
Admission: RE | Disposition: A | Discharge status: Home / Self Care | Payer: Self-pay | Source: Home / Self Care | Attending: Internal Medicine

## 2018-08-28 ENCOUNTER — Other Ambulatory Visit: Payer: Self-pay

## 2018-08-28 ENCOUNTER — Ambulatory Visit (HOSPITAL_COMMUNITY)
Admission: RE | Admit: 2018-08-28 | Discharge: 2018-08-28 | Disposition: A | Discharge status: Home / Self Care | Payer: Medicaid Other | Attending: Internal Medicine | Admitting: Internal Medicine

## 2018-08-28 ENCOUNTER — Encounter (HOSPITAL_COMMUNITY): Payer: Self-pay | Admitting: *Deleted

## 2018-08-28 ENCOUNTER — Ambulatory Visit (HOSPITAL_COMMUNITY): Payer: Medicaid Other | Admitting: Anesthesiology

## 2018-08-28 DIAGNOSIS — R0989 Other specified symptoms and signs involving the circulatory and respiratory systems: Secondary | ICD-10-CM

## 2018-08-28 DIAGNOSIS — K222 Esophageal obstruction: Secondary | ICD-10-CM | POA: Diagnosis not present

## 2018-08-28 DIAGNOSIS — Z793 Long term (current) use of hormonal contraceptives: Secondary | ICD-10-CM | POA: Insufficient documentation

## 2018-08-28 DIAGNOSIS — R1310 Dysphagia, unspecified: Secondary | ICD-10-CM | POA: Diagnosis present

## 2018-08-28 DIAGNOSIS — Z79899 Other long term (current) drug therapy: Secondary | ICD-10-CM | POA: Insufficient documentation

## 2018-08-28 DIAGNOSIS — R198 Other specified symptoms and signs involving the digestive system and abdomen: Secondary | ICD-10-CM

## 2018-08-28 DIAGNOSIS — K219 Gastro-esophageal reflux disease without esophagitis: Secondary | ICD-10-CM | POA: Diagnosis not present

## 2018-08-28 HISTORY — PX: ESOPHAGOGASTRODUODENOSCOPY (EGD) WITH PROPOFOL: SHX5813

## 2018-08-28 HISTORY — PX: MALONEY DILATION: SHX5535

## 2018-08-28 SURGERY — ESOPHAGOGASTRODUODENOSCOPY (EGD) WITH PROPOFOL
Anesthesia: Monitor Anesthesia Care

## 2018-08-28 MED ORDER — GLYCOPYRROLATE 0.2 MG/ML IJ SOLN
INTRAMUSCULAR | Status: DC | PRN
  Administered 2018-08-28: 0.2 mg via INTRAVENOUS

## 2018-08-28 MED ORDER — CHLORHEXIDINE GLUCONATE CLOTH 2 % EX PADS: 6.0000 | MEDICATED_PAD | Freq: Once | CUTANEOUS | Status: DC

## 2018-08-28 MED ORDER — HYDROMORPHONE HCL 1 MG/ML IJ SOLN: 0.2500 mg | INTRAMUSCULAR | Status: DC | PRN

## 2018-08-28 MED ORDER — LACTATED RINGERS IV SOLN: INTRAVENOUS | Status: DC

## 2018-08-28 MED ORDER — PROPOFOL 10 MG/ML IV BOLUS
INTRAVENOUS | Status: DC | PRN
  Administered 2018-08-28 (×2): 30 mg via INTRAVENOUS

## 2018-08-28 MED ORDER — MEPERIDINE HCL 100 MG/ML IJ SOLN: 6.2500 mg | INTRAMUSCULAR | Status: DC | PRN

## 2018-08-28 MED ORDER — PROPOFOL 500 MG/50ML IV EMUL
INTRAVENOUS | Status: DC | PRN
  Administered 2018-08-28: 150 ug/kg/min via INTRAVENOUS

## 2018-08-28 MED ORDER — PROMETHAZINE HCL 25 MG/ML IJ SOLN: 6.2500 mg | INTRAMUSCULAR | Status: DC | PRN

## 2018-08-28 MED ORDER — LIDOCAINE HCL (CARDIAC) PF 100 MG/5ML IV SOSY
PREFILLED_SYRINGE | INTRAVENOUS | Status: DC | PRN
  Administered 2018-08-28: 30 mg via INTRAVENOUS

## 2018-08-28 MED ORDER — LACTATED RINGERS IV SOLN
INTRAVENOUS | Status: DC | PRN
  Administered 2018-08-28: 11:00:00 via INTRAVENOUS

## 2018-08-28 MED ORDER — HYDROCODONE-ACETAMINOPHEN 7.5-325 MG PO TABS: 1.0000 | ORAL_TABLET | Freq: Once | ORAL | Status: DC | PRN

## 2018-08-28 NOTE — Op Note (Signed)
[facility] Patient Name: Linda Reid Procedure Date: 08/28/2018 11:22 AM MRN: [id number] Date of Birth: 12-Jan-1986 Attending MD: Norvel Richards , MD CSN: [id number] Age: 33 Admit Type: Outpatient Procedure:                Upper GI endoscopy Indications:              Dysphagia Providers:                Norvel Richards, MD, Jeanann Lewandowsky. Sharon Seller, RN,                            Aram Candela Referring MD:              Medicines:                Propofol per Anesthesia Complications:            No immediate complications. Estimated Blood Loss:     Estimated blood loss was minimal. Procedure:                Pre-Anesthesia Assessment:                           - Prior to the procedure, a History and Physical                            was performed, and patient medications and                            allergies were reviewed. The patient's tolerance of                            previous anesthesia was also reviewed. The risks                            and benefits of the procedure and the sedation                            options and risks were discussed with the patient.                            All questions were answered, and informed consent                            was obtained. Prior Anticoagulants: The patient has                            taken no previous anticoagulant or antiplatelet                            agents. ASA Grade Assessment: II - A patient with                            mild systemic disease. After reviewing the risks  and benefits, the patient was deemed in                            satisfactory condition to undergo the procedure.                           After obtaining informed consent, the endoscope was                            passed under direct vision. Throughout the                            procedure, the patient's blood pressure, pulse, and                            oxygen saturations were  monitored continuously. The                            [id number]-H190 ([id number]) scope was introduced through the                            and advanced to the second part of duodenum. The                            upper GI endoscopy was accomplished without                            difficulty. The patient tolerated the procedure                            well. Scope In: 95:62:13 AM Scope Out: 11:54:06 AM Total Procedure Duration: 0 hours 7 minutes 48 seconds  Findings:      A mild Schatzki ring was found at the gastroesophageal junction.      The entire examined stomach was normal.      The duodenal bulb and second portion of the duodenum were normal. The       scope was withdrawn. Dilation was performed with a Maloney dilator with       mild resistance at 26 Fr. The scope was withdrawn. Dilation was       performed with a Maloney dilator with mild resistance at 56 Fr. The       dilation site was examined following endoscope reinsertion and showed       moderate mucosal disruption. Estimated blood loss was minimal. Impression:               - Mild Schatzki ring. Dilated.                           - Normal stomach.                           - Normal duodenal bulb and second portion of the                            duodenum.                           -  No specimens collected. Moderate Sedation:      Moderate (conscious) sedation was personally administered by an       anesthesia professional. The following parameters were monitored: oxygen       saturation, heart rate, blood pressure, respiratory rate, EKG, adequacy       of pulmonary ventilation, and response to care. Recommendation:           - Patient has a contact number available for                            emergencies. The signs and symptoms of potential                            delayed complications were discussed with the                            patient. Return to normal activities tomorrow.                             Written discharge instructions were provided to the                            patient.                           - Resume previous diet.                           - Continue present medications. Continue Protonix                            40 mg daily                           - No repeat upper endoscopy.                           - Return to GI office in 3 months. Procedure Code(s):        --- Professional ---                           [phone], Esophagogastroduodenoscopy, flexible,                            transoral; diagnostic, including collection of                            specimen(s) by brushing or washing, when performed                            (separate procedure)                           43450, Dilation of esophagus, by unguided sound or                            bougie, single or multiple  passes Diagnosis Code(s):        --- Professional ---                           K22.2, Esophageal obstruction                           R13.10, Dysphagia, unspecified CPT copyright 2018 American Medical Association. All rights reserved. The codes documented in this report are preliminary and upon coder review may  be revised to meet current compliance requirements. Cristopher Estimable. Rourk, MD Norvel Richards, MD 08/28/2018 12:02:14 PM This report has been signed electronically. Number of Addenda: 0

## 2018-08-28 NOTE — Anesthesia Postprocedure Evaluation (Signed)
Anesthesia Post Note  Patient: Linda Reid  Procedure(s) Performed: ESOPHAGOGASTRODUODENOSCOPY (EGD) WITH PROPOFOL (N/A ) MALONEY DILATION (N/A )  Patient location during evaluation: PACU Anesthesia Type: MAC Level of consciousness: awake and alert and oriented Pain management: pain level controlled Vital Signs Assessment: post-procedure vital signs reviewed and stable Respiratory status: spontaneous breathing Cardiovascular status: stable Postop Assessment: no apparent nausea or vomiting Anesthetic complications: no     Last Vitals:  Vitals:   08/28/18 1107  Pulse: 80  Temp: 37.1 C  SpO2: 98%    Last Pain:  Vitals:   08/28/18 1140  TempSrc:   PainSc: 0-No pain                 ADAMS, AMY A

## 2018-08-28 NOTE — Transfer of Care (Signed)
Immediate Anesthesia Transfer of Care Note  Patient: Linda Reid  Procedure(s) Performed: ESOPHAGOGASTRODUODENOSCOPY (EGD) WITH PROPOFOL (N/A ) MALONEY DILATION (N/A )  Patient Location: PACU  Anesthesia Type:MAC  Level of Consciousness: awake, alert , oriented and patient cooperative  Airway & Oxygen Therapy: Patient Spontanous Breathing  Post-op Assessment: Report given to RN and Post -op Vital signs reviewed and stable  Post vital signs: Reviewed and stable  Last Vitals:  Vitals Value Taken Time  BP 99/63 08/28/2018 12:03 PM  Temp    Pulse 77 08/28/2018 12:05 PM  Resp 21 08/28/2018 12:05 PM  SpO2 100 % 08/28/2018 12:05 PM  Vitals shown include unvalidated device data.  Last Pain:  Vitals:   08/28/18 1140  TempSrc:   PainSc: 0-No pain         Complications: No apparent anesthesia complications

## 2018-08-28 NOTE — Anesthesia Preprocedure Evaluation (Signed)
Anesthesia Evaluation    Airway Mallampati: II     Mouth opening: Limited Mouth Opening  Dental  (+) Teeth Intact, Dental Advidsory Given   Pulmonary    breath sounds clear to auscultation       Cardiovascular  Rhythm:regular     Neuro/Psych    GI/Hepatic GERD  ,  Endo/Other  Hyperthyroidism   Renal/GU      Musculoskeletal   Abdominal   Peds  Hematology   Anesthesia Other Findings Complaints of dysphagia, N/V  Reproductive/Obstetrics                             Anesthesia Physical Anesthesia Plan  ASA: II  Anesthesia Plan: MAC   Post-op Pain Management:    Induction:   PONV Risk Score and Plan:   Airway Management Planned:   Additional Equipment:   Intra-op Plan:   Post-operative Plan:   Informed Consent: I have reviewed the patients History and Physical, chart, labs and discussed the procedure including the risks, benefits and alternatives for the proposed anesthesia with the patient or authorized representative who has indicated his/her understanding and acceptance.   Dental Advisory Given  Plan Discussed with: Anesthesiologist  Anesthesia Plan Comments:         Anesthesia Quick Evaluation

## 2018-08-28 NOTE — Discharge Instructions (Signed)
EGD Discharge instructions Please read the instructions outlined below and refer to this sheet in the next few weeks. These discharge instructions provide you with general information on caring for yourself after you leave the hospital. Your doctor may also give you specific instructions. While your treatment has been planned according to the most current medical practices available, unavoidable complications occasionally occur. If you have any problems or questions after discharge, please call your doctor. ACTIVITY  You may resume your regular activity but move at a slower pace for the next 24 hours.   Take frequent rest periods for the next 24 hours.   Walking will help expel (get rid of) the air and reduce the bloated feeling in your abdomen.   No driving for 24 hours (because of the anesthesia (medicine) used during the test).   You may shower.   Do not sign any important legal documents or operate any machinery for 24 hours (because of the anesthesia used during the test).  NUTRITION  Drink plenty of fluids.   You may resume your normal diet.   Begin with a light meal and progress to your normal diet.   Avoid alcoholic beverages for 24 hours or as instructed by your caregiver.  MEDICATIONS  You may resume your normal medications unless your caregiver tells you otherwise.  WHAT YOU CAN EXPECT TODAY  You may experience abdominal discomfort such as a feeling of fullness or gas pains.  FOLLOW-UP  Your doctor will discuss the results of your test with you.  SEEK IMMEDIATE MEDICAL ATTENTION IF ANY OF THE FOLLOWING OCCUR:  Excessive nausea (feeling sick to your stomach) and/or vomiting.   Severe abdominal pain and distention (swelling).   Trouble swallowing.   Temperature over 101 F (37.8 C).   Rectal bleeding or vomiting of blood.    Continue Protonix 40 mg daily.  Office visit with [name] in 3 months         Monitored Anesthesia Care, Care After These  instructions provide you with information about caring for yourself after your procedure. Your health care provider may also give you more specific instructions. Your treatment has been planned according to current medical practices, but problems sometimes occur. Call your health care provider if you have any problems or questions after your procedure. What can I expect after the procedure? After your procedure, you may:  Feel sleepy for several hours.  Feel clumsy and have poor balance for several hours.  Feel forgetful about what happened after the procedure.  Have poor judgment for several hours.  Feel nauseous or vomit.  Have a sore throat if you had a breathing tube during the procedure. Follow these instructions at home: For at least 24 hours after the procedure:      Have a responsible adult stay with you. It is important to have someone help care for you until you are awake and alert.  Rest as needed.  Do not: ? Participate in activities in which you could fall or become injured. ? Drive. ? Use heavy machinery. ? Drink alcohol. ? Take sleeping pills or medicines that cause drowsiness. ? Make important decisions or sign legal documents. ? Take care of children on your own. Eating and drinking  Follow the diet that is recommended by your health care provider.  If you vomit, drink water, juice, or soup when you can drink without vomiting.  Make sure you have little or no nausea before eating solid foods. General instructions  Take over-the-counter and prescription  medicines only as told by your health care provider.  If you have sleep apnea, surgery and certain medicines can increase your risk for breathing problems. Follow instructions from your health care provider about wearing your sleep device: ? Anytime you are sleeping, including during daytime naps. ? While taking prescription pain medicines, sleeping medicines, or medicines that make you drowsy.  If you  smoke, do not smoke without supervision.  Keep all follow-up visits as told by your health care provider. This is important. Contact a health care provider if:  You keep feeling nauseous or you keep vomiting.  You feel light-headed.  You develop a rash.  You have a fever. Get help right away if:  You have trouble breathing. Summary  For several hours after your procedure, you may feel sleepy and have poor judgment.  Have a responsible adult stay with you for at least 24 hours or until you are awake and alert. This information is not intended to replace advice given to you by your health care provider. Make sure you discuss any questions you have with your health care provider. Document Released: 11/30/2015 Document Revised: 03/25/2017 Document Reviewed: 11/30/2015 Elsevier Interactive Patient Education  2019 [employer].

## 2018-08-28 NOTE — Interval H&P Note (Signed)
History and Physical Interval Note:  08/28/2018 11:38 AM  Kaylor Richardine Service  has presented today for surgery, with the diagnosis of globus sensation, dysphagia  The various methods of treatment have been discussed with the patient and family. After consideration of risks, benefits and other options for treatment, the patient has consented to  Procedure(s) with comments: ESOPHAGOGASTRODUODENOSCOPY (EGD) WITH PROPOFOL (N/A) - 12:45pm MALONEY DILATION (N/A) as a surgical intervention .  The patient's history has been reviewed, patient examined, no change in status, stable for surgery.  I have reviewed the patient's chart and labs.  Questions were answered to the patient's satisfaction.     Torrell Krutz  No change.  EGD with possible ED per plan.  The risks, benefits, limitations, alternatives and imponderables have been reviewed with the patient. Potential for esophageal dilation, biopsy, etc. have also been reviewed.  Questions have been answered. All parties agreeable.

## 2018-08-29 ENCOUNTER — Ambulatory Visit (HOSPITAL_COMMUNITY): Payer: Medicaid Other

## 2018-09-01 ENCOUNTER — Encounter (HOSPITAL_COMMUNITY): Payer: Self-pay | Admitting: Internal Medicine

## 2018-09-01 ENCOUNTER — Ambulatory Visit (HOSPITAL_COMMUNITY)
Admission: RE | Admit: 2018-09-01 | Discharge: 2018-09-01 | Disposition: A | Discharge status: Home / Self Care | Payer: Medicaid Other | Source: Ambulatory Visit | Attending: "Endocrinology | Admitting: "Endocrinology

## 2018-09-01 DIAGNOSIS — E042 Nontoxic multinodular goiter: Secondary | ICD-10-CM | POA: Insufficient documentation

## 2018-09-05 ENCOUNTER — Encounter: Payer: Self-pay | Admitting: "Endocrinology

## 2018-09-05 ENCOUNTER — Ambulatory Visit (INDEPENDENT_AMBULATORY_CARE_PROVIDER_SITE_OTHER): Payer: Medicaid Other | Admitting: "Endocrinology

## 2018-09-05 VITALS — Ht 69.0 in | Wt 179.0 lb

## 2018-09-05 DIAGNOSIS — E0590 Thyrotoxicosis, unspecified without thyrotoxic crisis or storm: Secondary | ICD-10-CM | POA: Diagnosis not present

## 2018-09-05 DIAGNOSIS — E559 Vitamin D deficiency, unspecified: Secondary | ICD-10-CM | POA: Diagnosis not present

## 2018-09-05 NOTE — Progress Notes (Signed)
Endocrinology follow-up note   Subjective:    Patient ID: Linda Reid, female    DOB: 1986/05/04, PCP Muse, Noel Journey., PA-C   Past Medical History:  Diagnosis Date  . Anxiety   . Arthritis    lower back, right leg  . Back pain   . Cerebrospinal fluid leak from spinal puncture 03/03/2016   after epidural steroid injection; states has had 2 blood patch procedures   . Difficulty swallowing solids    certain foods  . GERD (gastroesophageal reflux disease)   . Hyperthyroidism   . Migraines   . Nasal congestion 12/06/2016  . Neck pain of over 3 months duration    posterior and right side  . Neuropathy    right leg  . Restless leg syndrome   . Thyroid nodule 11/2016   right  . Tracheal deviation    due to thyroid nodule, per pt.  . Urinary frequency    Past Surgical History:  Procedure Laterality Date  . DG MYLEOGRAM LUMBAR SPINE ([facility])    . DILATION AND CURETTAGE OF UTERUS  06/25/2002  . ESOPHAGOGASTRODUODENOSCOPY (EGD) WITH PROPOFOL N/A 08/28/2018   Procedure: ESOPHAGOGASTRODUODENOSCOPY (EGD) WITH PROPOFOL;  Surgeon: Daneil Dolin, MD;  [facility];  Service: Endoscopy;  Laterality: N/A;  12:45pm  . MALONEY DILATION N/A 08/28/2018   Procedure: [name] DILATION;  Surgeon: Daneil Dolin, MD;  Location: [facility];  Service: Endoscopy;  Laterality: N/A;  . THYROIDECTOMY Right 12/13/2016   Procedure: RIGHT HEMI-THYROIDECTOMY;  Surgeon: Leta Baptist, MD;  Location: [facility];  Service: ENT;  Laterality: Right;  . TONSILLECTOMY     Social History   Socioeconomic History  . Marital status: Married    Spouse name: Psychiatrist   . Number of children: 3  . Years of education: 4 Bachelor  . Highest education level: Not on file  Occupational History  . 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 Financial resource strain: Not on file  . Food insecurity:   Worry: Not on file    Inability: Not on file  . Transportation needs:    Medical: Not on file    Non-medical: Not on file  Tobacco Use  . Smoking status: Never Smoker  . Smokeless tobacco: Never Used  Substance and Sexual Activity  . Alcohol use: No  . Drug use: No  . Sexual activity: Yes    Birth control/protection: Implant  Lifestyle  . Physical activity:    Days per week: Not on file    Minutes per session: Not on file  . Stress: Not on file  Relationships  . Social connections:    Talks on phone: Not on file    Gets together: Not on file    Attends religious service: Not on file    Active member of club or organization: Not on file    Attends meetings of clubs or organizations: Not on file    Relationship status: Not on file  Other Topics Concern  . Not on file  Social History Narrative   Patient lives with her fiance and 3 children.    Her fiance is the father of her current pregnancy, other three children were fathered by her previous partner.    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     Caffeine use: none   Right-handed   Outpatient Encounter Medications as of 09/05/2018  Medication Sig  . baclofen (LIORESAL) 10 MG tablet Take 10 mg by mouth 3 (three) times daily as needed for muscle spasms.  . Cholecalciferol (VITAMIN D3) 5000 units CAPS Take 1 capsule (5,000 Units total) by mouth daily.  . DULoxetine (CYMBALTA) 30 MG capsule Take 1 capsule (30 mg total) by mouth at bedtime.  Marland Kitchen etonogestrel (NEXPLANON) 68 MG IMPL implant 1 each by Subdermal route once.  . pantoprazole (PROTONIX) 40 MG tablet Take 1 tablet (40 mg total) by mouth daily. 30 minutes before breakfast daily.  . promethazine (PHENERGAN) 25 MG tablet Take 0.5 tablets (12.5 mg total) by mouth every 6 (six) hours as needed for nausea or vomiting.  Marland Kitchen tiZANidine (ZANAFLEX) 4 MG tablet Take 1 tablet (4 mg  total) by mouth every 6 (six) hours as needed for muscle spasms.   Facility-Administered Encounter Medications as of 09/05/2018  Medication  . triamcinolone acetonide (KENALOG) 10 MG/ML injection 10 mg   ALLERGIES: No Known Allergies  VACCINATION STATUS: Immunization History  Administered Date(s) Administered  . Influenza,inj,Quad PF,6+ Mos 05/21/2015  . Tdap 06/21/2015    HPI 33 year old female patient with medical history as above.   She is being seen in follow up for subclinical hyperthyroidism. She has history of right hemi-thyroidectomy in 2018 with benign outcomes.  Her previsit surveillance thyroid ultrasound reveals right lobe residual tissue measuring 1.6 cm with no nodular lesions, and left lobe measuring 5.5 cm with no discrete nodular lesions. - She had had normal thyroid function tests in October 2019.   -She still complains of dysphagia, not associated with shortness of breath nor voice change. - She has stable weight.  She status post correction of hypocalcemia and hypokalemia.   -She is on ongoing vitamin D supplements.  -She denies any family history of thyroid dysfunction nor thyroid cancer. - She is a mother of 4 kids age  Range 84-14. She is not currently on any antithyroid medications nor thyroid hormone replacement.  Review of Systems  Constitutional: + Steady weight, + fatigue, no subjective hyperthermia, no subjective hypothermia Eyes: no blurry vision, no xerophthalmia ENT: no sore throat,  She is status post right hemithyroidectomy in April 2018, + dysphagia/odynophagia, no hoarseness Cardiovascular: no Chest Pain, no Shortness of Breath, no palpitations, no leg swelling Musculoskeletal: no muscle/joint aches Skin: no rashes Neurological: no tremors, no numbness, no tingling, no dizziness Psychiatric: no depression, no anxiety  Objective:    Ht 5\' 9"  (1.753 m)   Wt 179 lb (81.2 kg)   BMI 26.43 kg/m   Wt Readings from Last 3 Encounters:  09/05/18  179 lb (81.2 kg)  08/28/18 174 lb 13.2 oz (79.3 kg)  08/24/18 175 lb (79.4 kg)    Physical Exam  Constitutional: Appropriate weight for height, not in acute distress, normal state of mind Eyes: PERRLA, EOMI, no exophthalmos ENT: moist mucous membranes, + healed scar on lower neck from thyroidectomy, + palpable left lobe of the thyroid, no cervical lymphadenopathy Cardiovascular: normal precordial activity, Regular Rate and Rhythm, no Murmur/Rubs/Gallops Respiratory:  adequate breathing efforts, no gross chest deformity, Clear to auscultation bilaterally Gastrointestinal: abdomen soft, Non -tender, No distension, Bowel Sounds present Musculoskeletal: no gross deformities, strength intact in all four extremities Skin: moist, warm, no rashes Neurological: no tremor with outstretched hands, Deep tendon reflexes normal in all four extremities.  CMP ( most recent) CMP  Component Value Date/Time   NA 138 08/24/2018 1526   K 3.7 08/24/2018 1526   CL 107 08/24/2018 1526   CO2 27 08/24/2018 1526   GLUCOSE 97 08/24/2018 1526   BUN 9 08/24/2018 1526   CREATININE 0.71 08/24/2018 1526   CREATININE 0.90 04/20/2017 0905   CALCIUM 8.7 (L) 08/24/2018 1526   PROT 8.0 06/11/2018 2311   ALBUMIN 4.3 06/11/2018 2311   AST 16 06/11/2018 2311   ALT 13 06/11/2018 2311   ALKPHOS 49 06/11/2018 2311   BILITOT 0.3 06/11/2018 2311   GFRNONAA >60 08/24/2018 1526   GFRAA >60 08/24/2018 1526    Surgical sample of right hemithyroidectomy from 12/13/2016 showed a 5 cm nodule removed along with her right lobe of the thyroid which was 5.3 cm on the longest dimension. Pathology was negative for malignancy.  Results for MIKAHLA, WISOR (MRN [id number]) as of 09/05/2018 08:52  Ref. Range 03/16/2017 10:29 05/30/2018 09:36  TSH Latest Units: mIU/L 0.57 0.87  Triiodothyronine,Free,Serum Latest Ref Range: 2.3 - 4.2 pg/mL 2.8 3.0  T4,Free(Direct) Latest Ref Range: 0.8 - 1.8 ng/dL 1.0 1.0  Thyroglobulin Ab  Latest Ref Range: <2 IU/mL <1   Thyroperoxidase Ab SerPl-aCnc Latest Ref Range: <9 IU/mL <1    Thyroid ultrasound September 01, 2018 Residual right lobe remnant 1.6 cm with no nodular lesions, left lobe 5.5 cm with no discrete nodular lesions.  Assessment & Plan:   1. Subclinical hyperthyroidism-history of -Her repeat previsit thyroid function tests are within normal limits.  She will not require intervention with thyroid hormone supplement at this time.    She is also status post right hemithyroidectomy on 12/13/2016 with benign outcomes.   -Due to her ongoing complaint of dysphagia, she was offered surveillance thyroid ultrasound which is unremarkable.   -Her electrolyte abnormalities including hypokalemia and hypocalcemia have been corrected.  She has no endocrine etiology for the ongoing dysphagia at this time. -She will benefit from ENT evaluation  . -She is advised to continue vitamin D supplements.  - I advised patient to maintain close follow up with Raiford Simmonds., PA-C for primary care needs.  Follow up plan: Return in about 1 year (around 09/06/2019) for Follow up with Pre-visit Labs.  Glade Lloyd, MD Phone: [phone]  Fax: [phone]   09/05/2018, 9:05 AM

## 2018-09-07 ENCOUNTER — Emergency Department (HOSPITAL_COMMUNITY): Payer: Medicaid Other

## 2018-09-07 ENCOUNTER — Other Ambulatory Visit: Payer: Self-pay

## 2018-09-07 ENCOUNTER — Encounter (HOSPITAL_COMMUNITY): Payer: Self-pay | Admitting: *Deleted

## 2018-09-07 ENCOUNTER — Emergency Department (HOSPITAL_COMMUNITY)
Admission: EM | Admit: 2018-09-07 | Discharge: 2018-09-07 | Disposition: A | Discharge status: Home / Self Care | Payer: Medicaid Other | Attending: Emergency Medicine | Admitting: Emergency Medicine

## 2018-09-07 DIAGNOSIS — R0789 Other chest pain: Secondary | ICD-10-CM | POA: Diagnosis not present

## 2018-09-07 DIAGNOSIS — K219 Gastro-esophageal reflux disease without esophagitis: Secondary | ICD-10-CM

## 2018-09-07 DIAGNOSIS — R079 Chest pain, unspecified: Secondary | ICD-10-CM | POA: Diagnosis present

## 2018-09-07 DIAGNOSIS — Z79899 Other long term (current) drug therapy: Secondary | ICD-10-CM | POA: Insufficient documentation

## 2018-09-07 LAB — I-STAT TROPONIN, ED: Troponin i, poc: 0 ng/mL (ref 0.00–0.08)

## 2018-09-07 MED ORDER — NAPROXEN 250 MG PO TABS
500.0000 mg | ORAL_TABLET | Freq: Once | ORAL | Status: AC
  Administered 2018-09-07: 500 mg via ORAL
  Filled 2018-09-07: qty 2

## 2018-09-07 MED ORDER — PANTOPRAZOLE SODIUM 20 MG PO TBEC: DELAYED_RELEASE_TABLET | ORAL | 0 refills | Status: DC

## 2018-09-07 MED ORDER — NAPROXEN 250 MG PO TABS: ORAL_TABLET | ORAL | 0 refills | Status: DC

## 2018-09-07 MED ORDER — FAMOTIDINE 20 MG PO TABS
20.0000 mg | ORAL_TABLET | Freq: Once | ORAL | Status: AC
  Administered 2018-09-07: 20 mg via ORAL
  Filled 2018-09-07: qty 1

## 2018-09-07 MED ORDER — ALUM & MAG HYDROXIDE-SIMETH 200-200-20 MG/5ML PO SUSP
30.0000 mL | Freq: Once | ORAL | Status: AC
  Administered 2018-09-07: 30 mL via ORAL
  Filled 2018-09-07: qty 30

## 2018-09-07 MED ORDER — METHOCARBAMOL 500 MG PO TABS
500.0000 mg | ORAL_TABLET | Freq: Once | ORAL | Status: AC
  Administered 2018-09-07: 500 mg via ORAL
  Filled 2018-09-07: qty 1

## 2018-09-07 MED ORDER — CYCLOBENZAPRINE HCL 5 MG PO TABS: 5.0000 mg | ORAL_TABLET | Freq: Three times a day (TID) | ORAL | 0 refills | Status: DC | PRN

## 2018-09-07 MED ORDER — DM-GUAIFENESIN ER 30-600 MG PO TB12
1.0000 | ORAL_TABLET | Freq: Two times a day (BID) | ORAL | Status: DC
  Administered 2018-09-07: 1 via ORAL
  Filled 2018-09-07: qty 1

## 2018-09-07 NOTE — Discharge Instructions (Addendum)
Look at the information about acid reflux and follow that diet.  Increase your Protonix to twice a day for 2 weeks then back down to once a day.  Take the medication as prescribed.  Use ice and heat on the sore muscles for comfort.  You can take Mucinex DM over-the-counter for your cough, but your cough is most likely from your uncontrolled acid reflux.  I reviewed your endoscopy that was done on the sixth and there was no sign of inflammation of your stomach or your esophagus from the stomach acid.

## 2018-09-07 NOTE — ED Provider Notes (Signed)
[facility] EMERGENCY DEPARTMENT Provider Note   CSN: [id number] Arrival date & time: 09/07/18  4401  Time seen 06:00 AM   History   Chief Complaint Chief Complaint  Patient presents with  . Chest Pain    HPI Linda Reid is a 33 y.o. female.  HPI patient reports about 30 minutes prior to arrival she was awakened from sleep with right-sided chest pain that it goes into her right shoulder and along her right trapezius muscle.  She states that is a constant achy pain however she coughs it gets sharp.  She was noted to be coughing during her exam and she states she had endoscopy done January 6 and had a esophageal dilatation.  She states she still is having a lot of reflux symptoms and she has been coughing ever since her procedure which is gotten worse the past few days.  She denies any fever.  She denies shortness of breath.  She has been having nausea related to the acid reflux otherwise not worse today.  No vomiting.  No diaphoresis.  She states she had chest pain about a year ago but it was on the left side and different from what she is having today.  PCP Muse, Noel Journey., PA-C   Past Medical History:  Diagnosis Date  . Anxiety   . Arthritis    lower back, right leg  . Back pain   . Cerebrospinal fluid leak from spinal puncture 03/03/2016   after epidural steroid injection; states has had 2 blood patch procedures   . Difficulty swallowing solids    certain foods  . GERD (gastroesophageal reflux disease)   . Hyperthyroidism   . Migraines   . Nasal congestion 12/06/2016  . Neck pain of over 3 months duration    posterior and right side  . Neuropathy    right leg  . Restless leg syndrome   . Thyroid nodule 11/2016   right  . Tracheal deviation    due to thyroid nodule, per pt.  . Urinary frequency     Patient Active Problem List   Diagnosis Date Noted  . Globus sensation 08/14/2018  . Nausea without vomiting 08/14/2018  . Vitamin D deficiency  06/02/2018  . Chronic pain syndrome 03/16/2018  . Hypokalemia 03/23/2017  . Hypocalcemia 03/23/2017  . Subclinical hyperthyroidism 03/16/2017  . S/P partial thyroidectomy 12/13/2016  . Nexplanon insertion 08/26/2015  . History of preterm delivery 08/26/2015  . History of chlamydia 08/26/2015  . Postpartum/situational depression 08/06/2015  . History of mastitis 07/14/2015  . Groin pain, chronic, right 07/14/2015    Past Surgical History:  Procedure Laterality Date  . DG MYLEOGRAM LUMBAR SPINE ([place] HX)    . DILATION AND CURETTAGE OF UTERUS  06/25/2002  . ESOPHAGOGASTRODUODENOSCOPY (EGD) WITH PROPOFOL N/A 08/28/2018   Procedure: ESOPHAGOGASTRODUODENOSCOPY (EGD) WITH PROPOFOL;  Surgeon: Daneil Dolin, MD;  [facility];  Service: Endoscopy;  Laterality: N/A;  12:45pm  . MALONEY DILATION N/A 08/28/2018   Procedure: [name] DILATION;  Surgeon: Daneil Dolin, MD;  Location: [facility];  Service: Endoscopy;  Laterality: N/A;  . THYROIDECTOMY Right 12/13/2016   Procedure: RIGHT HEMI-THYROIDECTOMY;  Surgeon: Leta Baptist, MD;  Location: [place];  Service: ENT;  Laterality: Right;  . TONSILLECTOMY       OB History    Gravida  6   Para  4   Term  1   Preterm  3   AB  2   Living  4     SAB  2   TAB  0   Ectopic  0   Multiple  0   Live Births  4            Home Medications    Prior to Admission medications   Medication Sig Start Date End Date Taking? Authorizing Provider  baclofen (LIORESAL) 10 MG tablet Take 10 mg by mouth 3 (three) times daily as needed for muscle spasms.    [provider]  Cholecalciferol (VITAMIN D3) 5000 units CAPS Take 1 capsule (5,000 Units total) by mouth daily. 06/02/18   Cassandria Anger, MD  cyclobenzaprine (FLEXERIL) 5 MG tablet Take 1 tablet (5 mg total) by mouth 3 (three) times daily as needed. 09/07/18   Rolland Porter, MD  DULoxetine (CYMBALTA) 30 MG capsule Take 1 capsule (30 mg total) by mouth  at bedtime. 03/16/18   Jamse Arn, MD  etonogestrel (NEXPLANON) 68 MG IMPL implant 1 each by Subdermal route once.    [provider]  naproxen (NAPROSYN) 250 MG tablet Take 1 po BID with food prn pain 09/07/18   Rolland Porter, MD  pantoprazole (PROTONIX) 20 MG tablet Take 1 po BID x 2 weeks then once a day 09/07/18   Rolland Porter, MD  pantoprazole (PROTONIX) 40 MG tablet Take 1 tablet (40 mg total) by mouth daily. 30 minutes before breakfast daily. 08/14/18   Annitta Needs, NP  promethazine (PHENERGAN) 25 MG tablet Take 0.5 tablets (12.5 mg total) by mouth every 6 (six) hours as needed for nausea or vomiting. 08/14/18   Annitta Needs, NP  tiZANidine (ZANAFLEX) 4 MG tablet Take 1 tablet (4 mg total) by mouth every 6 (six) hours as needed for muscle spasms. 04/26/17   Melvenia Beam, MD    Family History Family History  Problem Relation Age of Onset  . Cancer Maternal Grandmother        breast  . Diabetes Maternal Grandmother   . Hypertension Maternal Grandmother   . Heart disease Maternal Grandfather   . Hypertension Maternal Grandfather   . Hypertension Paternal Grandmother   . Hypertension Paternal Grandfather   . Premature birth Daughter   . Asthma Daughter   . Diabetes Maternal Aunt   . Hypertension Maternal Aunt   . Stroke Maternal Aunt   . Diabetes Maternal Uncle   . Hypertension Maternal Uncle   . Hypertension Paternal Aunt   . Hypertension Paternal Uncle   . Colon cancer Neg Hx   . Colon polyps Neg Hx     Social History Social History   Tobacco Use  . Smoking status: Never Smoker  . Smokeless tobacco: Never Used  Substance Use Topics  . Alcohol use: No  . Drug use: No     Allergies   Patient has no known allergies.   Review of Systems Review of Systems  All other systems reviewed and are negative.    Physical Exam Updated Vital Signs BP 124/81 (BP Location: Right Arm)   Pulse 95   Temp 99 F (37.2 C) (Oral)   Resp 20   Ht 5\' 9"  (1.753 m)    Wt 81 kg   SpO2 100%   BMI 26.37 kg/m   Vital signs normal    Physical Exam Vitals signs and nursing note reviewed.  Constitutional:      General: She is not in acute distress.    Appearance: Normal appearance. She is well-developed. She is not ill-appearing or  toxic-appearing.  HENT:     Head: Normocephalic and atraumatic.     Right Ear: External ear normal.     Left Ear: External ear normal.     Nose: Nose normal. No mucosal edema or rhinorrhea.     Mouth/Throat:     Dentition: No dental abscesses.     Pharynx: No uvula swelling.  Eyes:     Conjunctiva/sclera: Conjunctivae normal.     Pupils: Pupils are equal, round, and reactive to light.  Neck:     Musculoskeletal: Full passive range of motion without pain, normal range of motion and neck supple.   Cardiovascular:     Rate and Rhythm: Normal rate and regular rhythm.     Heart sounds: Normal heart sounds. No murmur. No friction rub. No gallop.   Pulmonary:     Effort: Pulmonary effort is normal. No respiratory distress.     Breath sounds: Normal breath sounds. No wheezing, rhonchi or rales.  Chest:     Chest wall: No tenderness or crepitus.       Comments: Patient is tender diffusely in her right upper chest laterally and also in her right shoulder and her right trapezius. Abdominal:     General: Bowel sounds are normal. There is no distension.     Palpations: Abdomen is soft.     Tenderness: There is no abdominal tenderness. There is no guarding or rebound.  Musculoskeletal: Normal range of motion.        General: No tenderness.       Arms:     Comments: Moves all extremities well.   Skin:    General: Skin is warm and dry.     Coloration: Skin is not pale.     Findings: No erythema or rash.  Neurological:     Mental Status: She is alert and oriented to person, place, and time.     Cranial Nerves: No cranial nerve deficit.  Psychiatric:        Mood and Affect: Mood is not anxious.        Speech: Speech  normal.        Behavior: Behavior normal.      ED Treatments / Results  Labs (all labs ordered are listed, but only abnormal results are displayed)  Results for orders placed or performed during the hospital encounter of 09/07/18  I-stat troponin, ED  Result Value Ref Range   Troponin i, poc 0.00 0.00 - 0.08 ng/mL   Comment 3           Laboratory interpretation all normal      Results for orders placed or performed during the hospital encounter of 08/24/18  CBC with Differential/Platelet  Result Value Ref Range   WBC 5.4 4.0 - 10.5 K/uL   RBC 4.32 3.87 - 5.11 MIL/uL   Hemoglobin 13.4 12.0 - 15.0 g/dL   HCT 41.8 36.0 - 46.0 %   MCV 96.8 80.0 - 100.0 fL   MCH 31.0 26.0 - 34.0 pg   MCHC 32.1 30.0 - 36.0 g/dL   RDW 11.9 11.5 - 15.5 %   Platelets 230 150 - 400 K/uL   nRBC 0.0 0.0 - 0.2 %   Neutrophils Relative % 65 %   Neutro Abs 3.5 1.7 - 7.7 K/uL   Lymphocytes Relative 26 %   Lymphs Abs 1.4 0.7 - 4.0 K/uL   Monocytes Relative 7 %   Monocytes Absolute 0.4 0.1 - 1.0 K/uL   Eosinophils Relative 2 %  Eosinophils Absolute 0.1 0.0 - 0.5 K/uL   Basophils Relative 0 %   Basophils Absolute 0.0 0.0 - 0.1 K/uL   Immature Granulocytes 0 %   Abs Immature Granulocytes 0.02 0.00 - 0.07 K/uL  Basic metabolic panel  Result Value Ref Range   Sodium 138 135 - 145 mmol/L   Potassium 3.7 3.5 - 5.1 mmol/L   Chloride 107 98 - 111 mmol/L   CO2 27 22 - 32 mmol/L   Glucose, Bld 97 70 - 99 mg/dL   BUN 9 6 - 20 mg/dL   Creatinine, Ser 0.71 0.44 - 1.00 mg/dL   Calcium 8.7 (L) 8.9 - 10.3 mg/dL   GFR calc non Af Amer >60 >60 mL/min   GFR calc Af Amer >60 >60 mL/min   Anion gap 4 (L) 5 - 15  hCG, serum, qualitative (Not at [facility])  Result Value Ref Range   Preg, Serum NEGATIVE NEGATIVE      EKG EKG Interpretation  Date/Time:  Thursday September 07 2018 05:31:11 EST Ventricular Rate:  93 PR Interval:    QRS Duration: 75 QT Interval:  373 QTC Calculation: 464 R Axis:   83 Text  Interpretation:  Sinus rhythm Ventricular premature complex ) Borderline repolarization abnormality Baseline wander No old tracing to compare Confirmed by Rolland Porter [phone]) on 09/07/2018 5:54:18 AM   Radiology Dg Chest 2 View  Result Date: 09/07/2018 CLINICAL DATA:  Cough for 1 month.  Right-sided chest pain EXAM: CHEST - 2 VIEW COMPARISON:  05/30/2017 FINDINGS: The heart size and mediastinal contours are within normal limits. Both lungs are clear. The visualized skeletal structures are unremarkable. IMPRESSION: Negative chest. Electronically Signed   By: Monte Fantasia M.D.   On: 09/07/2018 06:47    Procedures Procedures (including critical care time)   Endoscopy  Aug 28, 2018  Dr Gala Romney A mild Schatzki ring was found at the gastroesophageal junction. Findings: The entire examined stomach was normal. The duodenal bulb and second portion of the duodenum were normal. The scope was withdrawn. Dilation was performed with a Maloney dilator with mild resistance at 2 Fr. The scope was withdrawn. Dilation was performed with a Maloney dilator with mild resistance at 56 Fr. The dilation site was examined following endoscope reinsertion and showed moderate mucosal disruption. Estimated blood loss was minimal. - Mild Schatzki ring. Dilated. - Normal stomach. - Normal duodenal bulb and second portion of the duodenum. - No specimens collected.    Medications Ordered in ED Medications  dextromethorphan-guaiFENesin (MUCINEX DM) 30-600 MG per 12 hr tablet 1 tablet (1 tablet Oral Given 09/07/18 0700)  naproxen (NAPROSYN) tablet 500 mg (500 mg Oral Given 09/07/18 0618)  methocarbamol (ROBAXIN) tablet 500 mg (500 mg Oral Given 09/07/18 0619)  alum & mag hydroxide-simeth (MAALOX/MYLANTA) 200-200-20 MG/5ML suspension 30 mL (30 mLs Oral Given 09/07/18 0700)  famotidine (PEPCID) tablet 20 mg (20 mg Oral Given 09/07/18 0700)     Initial Impression / Assessment and Plan / ED Course  I have reviewed the  triage vital signs and the nursing notes.  Pertinent labs & imaging results that were available during my care of the patient were reviewed by me and considered in my medical decision making (see chart for details).     Patient just had basic blood work done on January 2, so only a troponin was done in the ED tonight.  Due to her cough chest x-ray was done.  I suspect her pain is mainly musculoskeletal.  I also suspect her  cough may be related to her uncontrolled GERD.  Recheck at 7:15 AM patient states she is feeling better.  We discussed her chest x-ray did not show infiltrate, her heart screen was negative.  Her symptoms are not consistent with cardiac chest pain but more likely coughing from chronic reflux and chest wall pain from the coughing.  She was discharged home.  We did discuss increasing her Protonix to twice a day for about 2 weeks and then dropping back down to once a day.  Final Clinical Impressions(s) / ED Diagnoses   Final diagnoses:  Chest wall pain  Gastroesophageal reflux disease without esophagitis    ED Discharge Orders         Ordered    pantoprazole (PROTONIX) 20 MG tablet     09/07/18 0725    naproxen (NAPROSYN) 250 MG tablet     09/07/18 0725    cyclobenzaprine (FLEXERIL) 5 MG tablet  3 times daily PRN     09/07/18 0725        OTC mucinex DM  Plan discharge  Rolland Porter, MD, Barbette Or, MD 09/07/18 [id number]

## 2018-09-07 NOTE — ED Triage Notes (Signed)
Pt c/o right side chest pain that radiates down right arm and up to right neck; pt states the pain woke her from sleep this am

## 2018-09-07 NOTE — ED Notes (Signed)
Pt back from x-ray.

## 2018-12-14 ENCOUNTER — Other Ambulatory Visit: Payer: Self-pay

## 2018-12-14 ENCOUNTER — Ambulatory Visit (INDEPENDENT_AMBULATORY_CARE_PROVIDER_SITE_OTHER): Payer: Medicaid Other | Admitting: Gastroenterology

## 2018-12-14 ENCOUNTER — Encounter: Payer: Self-pay | Admitting: Gastroenterology

## 2018-12-14 DIAGNOSIS — R1010 Upper abdominal pain, unspecified: Secondary | ICD-10-CM | POA: Diagnosis not present

## 2018-12-14 DIAGNOSIS — R0989 Other specified symptoms and signs involving the circulatory and respiratory systems: Secondary | ICD-10-CM | POA: Diagnosis not present

## 2018-12-14 DIAGNOSIS — R198 Other specified symptoms and signs involving the digestive system and abdomen: Secondary | ICD-10-CM

## 2018-12-14 MED ORDER — PANTOPRAZOLE SODIUM 40 MG PO TBEC: 40.0000 mg | DELAYED_RELEASE_TABLET | Freq: Two times a day (BID) | ORAL | 3 refills | Status: DC

## 2018-12-14 MED ORDER — PROMETHAZINE HCL 25 MG PO TABS: 12.5000 mg | ORAL_TABLET | Freq: Four times a day (QID) | ORAL | 0 refills | Status: DC | PRN

## 2018-12-14 NOTE — Progress Notes (Signed)
Primary Care Physician:  Raiford Simmonds., PA-C  Primary GI: Dr. Gala Romney   Virtual Visit via Telephone Note Due to COVID-19, visit is conducted virtually and was requested by patient.   I connected with Linda Reid on 12/14/18 at  9:30 AM EDT by telephone and verified that I am speaking with the correct person using two identifiers.   I discussed the limitations, risks, security and privacy concerns of performing an evaluation and management service by telephone and the availability of in person appointments. I also discussed with the patient that there may be a patient responsible charge related to this service. The patient expressed understanding and agreed to proceed.  Chief Complaint  Patient presents with  . globus sensation    "still on going issue and feels it is getting worse"  . Nausea    w/ vomiting a little bit. wakes up during the night off/on d/t nausea     History of Present Illness:  History of globus sensation, intermittent solid food dysphagia, left-sided abdominal pain. Previously evaluated by ENT in past. EGD with mild Schatzki ring s/p dilation, normal stomach and duodenum.   Sleep is terrible. Worse than before. Feels tightness in throat. Feels bloated. Gets pain in her back and can tell that reflux is getting worse. Gets pain right below her shoulder blades and feels stuck. Random cramping and bloating, upset stomach. Lower abdomen. In upper abdomen has pain that is tight. Pain worsened after eating. In bed no later than 1030 and toss and turn til 3am due to nausea. Constantly wakes up almost every hour with nausea, some pain. Feels like something is in between her shoulder blades. stabby pain.   Always has an itchy throat, feeling like something is there, even when not eating. If swallowing feels like swallowing a lump but never goes away. Last saw ENT in 2018. Taking Protonix once a day. Has taken BID for a few months without much improvement.    Past Medical History:  Diagnosis Date  . Anxiety   . Arthritis    lower back, right leg  . Back pain   . Cerebrospinal fluid leak from spinal puncture 03/03/2016   after epidural steroid injection; states has had 2 blood patch procedures   . Difficulty swallowing solids    certain foods  . GERD (gastroesophageal reflux disease)   . Hyperthyroidism   . Migraines   . Nasal congestion 12/06/2016  . Neck pain of over 3 months duration    posterior and right side  . Neuropathy    right leg  . Restless leg syndrome   . Thyroid nodule 11/2016   right  . Tracheal deviation    due to thyroid nodule, per pt.  . Urinary frequency      Past Surgical History:  Procedure Laterality Date  . DG MYLEOGRAM LUMBAR SPINE ([place] HX)    . DILATION AND CURETTAGE OF UTERUS  06/25/2002  . ESOPHAGOGASTRODUODENOSCOPY (EGD) WITH PROPOFOL N/A 08/28/2018   Procedure: ESOPHAGOGASTRODUODENOSCOPY (EGD) WITH PROPOFOL;  Surgeon: Daneil Dolin, MD;  [facility];  Service: Endoscopy;  Laterality: N/A;  12:45pm  . MALONEY DILATION N/A 08/28/2018   Procedure: [name] DILATION;  Surgeon: Daneil Dolin, MD;  [facility];  Service: Endoscopy;  Laterality: N/A;  . THYROIDECTOMY Right 12/13/2016   Procedure: RIGHT HEMI-THYROIDECTOMY;  Surgeon: Leta Baptist, MD;  Location: [place];  Service: ENT;  Laterality: Right;  . TONSILLECTOMY  Current Meds  Medication Sig  . Cholecalciferol (VITAMIN D3) 5000 units CAPS Take 1 capsule (5,000 Units total) by mouth daily.  Marland Kitchen etonogestrel (NEXPLANON) 68 MG IMPL implant 1 each by Subdermal route once.  . pantoprazole (PROTONIX) 40 MG tablet Take 1 tablet (40 mg total) by mouth daily. 30 minutes before breakfast daily.   Current Facility-Administered Medications for the 12/14/18 encounter (Office Visit) with Annitta Needs, NP  Medication  . triamcinolone acetonide (KENALOG) 10 MG/ML injection 10 mg      Review of Systems: Gen: Denies  fever, chills, anorexia. Denies fatigue, weakness, weight loss.  CV: Denies chest pain, palpitations, syncope, peripheral edema, and claudication. Resp: Denies dyspnea at rest, cough, wheezing, coughing up blood, and pleurisy. GI: see HPI Derm: Denies rash, itching, dry skin Psych: Denies depression, anxiety, memory loss, confusion. No homicidal or suicidal ideation.  Heme: Denies bruising, bleeding, and enlarged lymph nodes.  Observations/Objective: No distress. Pleasant and cooperative.   Assessment and Plan: 33 year old with history of dysphagia and globus sensation, s/p EGD with dilatation recently. Solid food dysphagia resolved but with persistent globus symptoms that are affecting quality of life. Followed by endocrine due to history of thyroid nodule and right thyroidectomy, with CT neck and thyroid ultrasound on file without concerning findings. Query LPR as culprit. Referring to ENT for repeat evaluation, as she has not been see in several years. For now, increase PPI to BID.   Abdominal pain: concern for biliary etiology. Check [name] and possibly will need HIDA.   Return in 4 months.   Follow Up Instructions: Please increase Protonix to twice a day, 30 minutes before breakfast and dinner.  I have referred you back to Dr. Benjamine Mola.   I am ordering an ultrasound of your gallbladder. We may need to do another test called a HIDA scan to assess the gallbladder function.   We will see you in 4 months regardless!    I discussed the assessment and treatment plan with the patient. The patient was provided an opportunity to ask questions and all were answered. The patient agreed with the plan and demonstrated an understanding of the instructions.   The patient was advised to call back or seek an in-person evaluation if the symptoms worsen or if the condition fails to improve as anticipated.  I provided 15 minutes of face-to-face time during this encounter.  Annitta Needs, PhD, ANP-BC  [facility]

## 2018-12-14 NOTE — Patient Instructions (Addendum)
Please increase Protonix to twice a day, 30 minutes before breakfast and dinner.  I have referred you back to Dr. Benjamine Mola.   I am ordering an ultrasound of your gallbladder. We may need to do another test called a HIDA scan to assess the gallbladder function.   We will see you in 4 months regardless!  I enjoyed seeing you again today! As you know, I value our relationship and want to provide genuine, compassionate, and quality care. I welcome your feedback. If you receive a survey regarding your visit,  I greatly appreciate you taking time to fill this out. See you next time!  Annitta Needs, PhD, ANP-BC [facility]

## 2018-12-20 ENCOUNTER — Other Ambulatory Visit: Payer: Self-pay | Admitting: *Deleted

## 2018-12-20 ENCOUNTER — Telehealth: Payer: Self-pay | Admitting: *Deleted

## 2018-12-20 DIAGNOSIS — R0989 Other specified symptoms and signs involving the circulatory and respiratory systems: Secondary | ICD-10-CM

## 2018-12-20 DIAGNOSIS — R198 Other specified symptoms and signs involving the digestive system and abdomen: Secondary | ICD-10-CM

## 2018-12-20 NOTE — Telephone Encounter (Signed)
Called patient to schedule u/s. Received VM and it is not set up. I have mailed letter with appt details.

## 2018-12-20 NOTE — Progress Notes (Signed)
CC'ED TO PCP 

## 2019-01-03 ENCOUNTER — Telehealth: Payer: Self-pay | Admitting: *Deleted

## 2019-01-03 NOTE — Telephone Encounter (Signed)
PA for ultrasound was approved via [employer] website. Auth# [id number] dates 01/01/2019-06/30/2019

## 2019-01-11 ENCOUNTER — Other Ambulatory Visit: Payer: Self-pay

## 2019-01-11 ENCOUNTER — Ambulatory Visit (INDEPENDENT_AMBULATORY_CARE_PROVIDER_SITE_OTHER): Payer: Medicaid Other | Admitting: Otolaryngology

## 2019-01-11 DIAGNOSIS — R070 Pain in throat: Secondary | ICD-10-CM

## 2019-01-11 DIAGNOSIS — K219 Gastro-esophageal reflux disease without esophagitis: Secondary | ICD-10-CM | POA: Diagnosis not present

## 2019-01-11 DIAGNOSIS — R1312 Dysphagia, oropharyngeal phase: Secondary | ICD-10-CM | POA: Diagnosis not present

## 2019-01-12 ENCOUNTER — Other Ambulatory Visit (HOSPITAL_COMMUNITY): Payer: Self-pay | Admitting: Otolaryngology

## 2019-01-12 ENCOUNTER — Other Ambulatory Visit: Payer: Self-pay | Admitting: Otolaryngology

## 2019-01-12 DIAGNOSIS — R1310 Dysphagia, unspecified: Secondary | ICD-10-CM

## 2019-01-18 ENCOUNTER — Other Ambulatory Visit: Payer: Self-pay

## 2019-01-18 ENCOUNTER — Ambulatory Visit (HOSPITAL_COMMUNITY)
Admission: RE | Admit: 2019-01-18 | Discharge: 2019-01-18 | Disposition: A | Discharge status: Home / Self Care | Payer: Medicaid Other | Source: Ambulatory Visit | Attending: Otolaryngology | Admitting: Otolaryngology

## 2019-01-18 ENCOUNTER — Other Ambulatory Visit (HOSPITAL_COMMUNITY): Payer: Self-pay | Admitting: Otolaryngology

## 2019-01-18 DIAGNOSIS — R1310 Dysphagia, unspecified: Secondary | ICD-10-CM | POA: Diagnosis present

## 2019-01-24 ENCOUNTER — Ambulatory Visit (HOSPITAL_COMMUNITY): Admission: RE | Admit: 2019-01-24 | Payer: Medicaid Other | Source: Ambulatory Visit

## 2019-02-08 ENCOUNTER — Ambulatory Visit (INDEPENDENT_AMBULATORY_CARE_PROVIDER_SITE_OTHER): Payer: Medicaid Other | Admitting: Otolaryngology

## 2019-02-08 DIAGNOSIS — K219 Gastro-esophageal reflux disease without esophagitis: Secondary | ICD-10-CM

## 2019-02-08 DIAGNOSIS — R0982 Postnasal drip: Secondary | ICD-10-CM

## 2019-02-08 DIAGNOSIS — R1312 Dysphagia, oropharyngeal phase: Secondary | ICD-10-CM | POA: Diagnosis not present

## 2019-03-23 ENCOUNTER — Other Ambulatory Visit: Payer: Self-pay

## 2019-03-23 ENCOUNTER — Emergency Department (HOSPITAL_COMMUNITY): Payer: Medicaid Other

## 2019-03-23 ENCOUNTER — Encounter (HOSPITAL_COMMUNITY): Payer: Self-pay | Admitting: *Deleted

## 2019-03-23 ENCOUNTER — Emergency Department (HOSPITAL_COMMUNITY)
Admission: EM | Admit: 2019-03-23 | Discharge: 2019-03-23 | Disposition: A | Discharge status: Home / Self Care | Payer: Medicaid Other | Attending: Emergency Medicine | Admitting: Emergency Medicine

## 2019-03-23 DIAGNOSIS — R1013 Epigastric pain: Secondary | ICD-10-CM | POA: Diagnosis present

## 2019-03-23 DIAGNOSIS — Z3202 Encounter for pregnancy test, result negative: Secondary | ICD-10-CM | POA: Insufficient documentation

## 2019-03-23 DIAGNOSIS — N3000 Acute cystitis without hematuria: Secondary | ICD-10-CM | POA: Insufficient documentation

## 2019-03-23 DIAGNOSIS — R1084 Generalized abdominal pain: Secondary | ICD-10-CM

## 2019-03-23 DIAGNOSIS — R110 Nausea: Secondary | ICD-10-CM | POA: Diagnosis not present

## 2019-03-23 DIAGNOSIS — Z79899 Other long term (current) drug therapy: Secondary | ICD-10-CM | POA: Diagnosis not present

## 2019-03-23 LAB — URINALYSIS, ROUTINE W REFLEX MICROSCOPIC
Bilirubin Urine: NEGATIVE
Glucose, UA: NEGATIVE mg/dL
Ketones, ur: NEGATIVE mg/dL
Nitrite: NEGATIVE
Protein, ur: NEGATIVE mg/dL
Specific Gravity, Urine: 1.028 (ref 1.005–1.030)
pH: 5 (ref 5.0–8.0)

## 2019-03-23 LAB — COMPREHENSIVE METABOLIC PANEL
ALT: 12 U/L (ref 0–44)
AST: 14 U/L — ABNORMAL LOW (ref 15–41)
Albumin: 4.2 g/dL (ref 3.5–5.0)
Alkaline Phosphatase: 50 U/L (ref 38–126)
Anion gap: 6 (ref 5–15)
BUN: 12 mg/dL (ref 6–20)
CO2: 26 mmol/L (ref 22–32)
Calcium: 8.6 mg/dL — ABNORMAL LOW (ref 8.9–10.3)
Chloride: 107 mmol/L (ref 98–111)
Creatinine, Ser: 0.74 mg/dL (ref 0.44–1.00)
GFR calc Af Amer: >60 mL/min (ref >60)
GFR calc non Af Amer: >60 mL/min (ref >60)
Glucose, Bld: 83 mg/dL (ref 70–99)
Potassium: 3.6 mmol/L (ref 3.5–5.1)
Sodium: 139 mmol/L (ref 135–145)
Total Bilirubin: 0.4 mg/dL (ref 0.3–1.2)
Total Protein: 7.3 g/dL (ref 6.5–8.1)

## 2019-03-23 LAB — CBC WITH DIFFERENTIAL/PLATELET
Abs Immature Granulocytes: 0.01 10*3/uL (ref 0.00–0.07)
Basophils Absolute: 0 10*3/uL (ref 0.0–0.1)
Basophils Relative: 0 %
Eosinophils Absolute: 0.1 10*3/uL (ref 0.0–0.5)
Eosinophils Relative: 1 %
HCT: 40.1 % (ref 36.0–46.0)
Hemoglobin: 12.7 g/dL (ref 12.0–15.0)
Immature Granulocytes: 0 %
Lymphocytes Relative: 27 %
Lymphs Abs: 1.6 10*3/uL (ref 0.7–4.0)
MCH: 31 pg (ref 26.0–34.0)
MCHC: 31.7 g/dL (ref 30.0–36.0)
MCV: 97.8 fL (ref 80.0–100.0)
Monocytes Absolute: 0.4 10*3/uL (ref 0.1–1.0)
Monocytes Relative: 7 %
Neutro Abs: 3.7 10*3/uL (ref 1.7–7.7)
Neutrophils Relative %: 65 %
Platelets: 242 10*3/uL (ref 150–400)
RBC: 4.1 MIL/uL (ref 3.87–5.11)
RDW: 12.4 % (ref 11.5–15.5)
WBC: 5.8 10*3/uL (ref 4.0–10.5)
nRBC: 0 % (ref 0.0–0.2)

## 2019-03-23 LAB — PREGNANCY, URINE: Preg Test, Ur: NEGATIVE

## 2019-03-23 LAB — LIPASE, BLOOD: Lipase: 24 U/L (ref 11–51)

## 2019-03-23 MED ORDER — ONDANSETRON 4 MG PO TBDP: 4.0000 mg | ORAL_TABLET | Freq: Three times a day (TID) | ORAL | 0 refills | Status: DC | PRN

## 2019-03-23 MED ORDER — ALUM & MAG HYDROXIDE-SIMETH 200-200-20 MG/5ML PO SUSP
30.0000 mL | Freq: Once | ORAL | Status: AC
  Administered 2019-03-23: 16:00:00 30 mL via ORAL
  Filled 2019-03-23: qty 30

## 2019-03-23 MED ORDER — DICYCLOMINE HCL 20 MG PO TABS: 20.0000 mg | ORAL_TABLET | Freq: Two times a day (BID) | ORAL | 0 refills | Status: DC

## 2019-03-23 MED ORDER — HYDROMORPHONE HCL 1 MG/ML IJ SOLN
1.0000 mg | Freq: Once | INTRAMUSCULAR | Status: AC
  Administered 2019-03-23: 16:00:00 1 mg via INTRAVENOUS
  Filled 2019-03-23: qty 1

## 2019-03-23 MED ORDER — IOHEXOL 300 MG/ML  SOLN
100.0000 mL | Freq: Once | INTRAMUSCULAR | Status: AC | PRN
  Administered 2019-03-23: 16:00:00 100 mL via INTRAVENOUS

## 2019-03-23 MED ORDER — ONDANSETRON HCL 4 MG/2ML IJ SOLN
4.0000 mg | Freq: Once | INTRAMUSCULAR | Status: AC
  Administered 2019-03-23: 14:00:00 4 mg via INTRAVENOUS
  Filled 2019-03-23: qty 2

## 2019-03-23 MED ORDER — LIDOCAINE VISCOUS HCL 2 % MT SOLN
15.0000 mL | Freq: Once | OROMUCOSAL | Status: AC
  Administered 2019-03-23: 16:00:00 15 mL via ORAL
  Filled 2019-03-23: qty 15

## 2019-03-23 MED ORDER — MORPHINE SULFATE (PF) 4 MG/ML IV SOLN
4.0000 mg | Freq: Once | INTRAVENOUS | Status: AC
  Administered 2019-03-23: 14:00:00 4 mg via INTRAVENOUS
  Filled 2019-03-23: qty 1

## 2019-03-23 MED ORDER — SODIUM CHLORIDE 0.9 % IV BOLUS
1000.0000 mL | Freq: Once | INTRAVENOUS | Status: AC
  Administered 2019-03-23: 14:00:00 1000 mL via INTRAVENOUS

## 2019-03-23 MED ORDER — CEPHALEXIN 500 MG PO CAPS: 500.0000 mg | ORAL_CAPSULE | Freq: Two times a day (BID) | ORAL | 0 refills | Status: AC

## 2019-03-23 NOTE — Discharge Instructions (Signed)
Take the medications as prescribed.  Follow-up with GI for recurrent symptoms.  Return to the ED for any new or worsening symptoms.

## 2019-03-23 NOTE — ED Provider Notes (Signed)
[facility] EMERGENCY DEPARTMENT Provider Note   CSN: [id number] Arrival date & time: 03/23/19  1214  History   Chief Complaint Chief Complaint  Patient presents with   Abdominal Pain    HPI Linda Reid is a 33 y.o. female with past medical history significant for anxiety, chronic back pain, fibromyalgia, dysphasia, GERD, hypothyroidism, right lower extremity neuropathy presents for evaluation of abdominal pain.  Patient states she has had diffuse left-sided abdominal pain and epigastric pain x4 days.  Has also had nausea.  Has history of chronic nausea and is seen by [facility] for this.  Was taking promethazine at home which has helped however she has had persistent nausea.  Pain worse with movement and lying down.  No recent injuries or trauma.  Denies fever, chills, chest pain, shortness of breath, diarrhea, constipation, melena, hematochezia.  She denies NSAID use or alcohol use.  Denies prior history of pancreatitis or ulcers.  Denies concerns for STDs, pelvic pain or vaginal discharge.  Describes her pain as aching.  Rates her current pain a 10/10.  Denies additional aggravating or alleviating factors.  History of similar symptoms 2 months ago.  Was told to increase her PPI and given promethazine.  Pain self resolved.  Mild dysuria with last 3 urines.  History obtained from patient and past medical records.  No interpreter was used.     HPI  Past Medical History:  Diagnosis Date   Anxiety    Arthritis    lower back, right leg   Back pain    Cerebrospinal fluid leak from spinal puncture 03/03/2016   after epidural steroid injection; states has had 2 blood patch procedures    Difficulty swallowing solids    certain foods   Fibromyalgia    GERD (gastroesophageal reflux disease)    Hyperthyroidism    Migraines    Nasal congestion 12/06/2016   Neck pain of over 3 months duration    posterior and right side   Neuropathy    right leg   Restless  leg syndrome    Thyroid nodule 11/2016   right   Tracheal deviation    due to thyroid nodule, per pt.   Urinary frequency     Patient Active Problem List   Diagnosis Date Noted   Pain of upper abdomen 12/14/2018   Globus sensation 08/14/2018   Nausea without vomiting 08/14/2018   Vitamin D deficiency 06/02/2018   Chronic pain syndrome 03/16/2018   Hypokalemia 03/23/2017   Hypocalcemia 03/23/2017   Subclinical hyperthyroidism 03/16/2017   S/P partial thyroidectomy 12/13/2016   Nexplanon insertion 08/26/2015   History of preterm delivery 08/26/2015   History of chlamydia 08/26/2015   Postpartum/situational depression 08/06/2015   History of mastitis 07/14/2015   Groin pain, chronic, right 07/14/2015    Past Surgical History:  Procedure Laterality Date   DG MYLEOGRAM LUMBAR SPINE ([place] [facility]X)     DILATION AND CURETTAGE OF UTERUS  06/25/2002   ESOPHAGOGASTRODUODENOSCOPY (EGD) WITH PROPOFOL N/A 08/28/2018   mild Schatzki ring s/p dilation, normal stomach and duodenum   MALONEY DILATION N/A 08/28/2018   Procedure: MALONEY DILATION;  Surgeon: Daneil Dolin, MD;  Location: [facility];  Service: Endoscopy;  Laterality: N/A;   THYROIDECTOMY Right 12/13/2016   Procedure: RIGHT HEMI-THYROIDECTOMY;  Surgeon: Leta Baptist, MD;  Location: [facility];  Service: ENT;  Laterality: Right;   TONSILLECTOMY       OB History    Gravida  6   Para  4   Term  1   Preterm  3   AB  2   Living  4     SAB  2   TAB  0   Ectopic  0   Multiple  0   Live Births  4            Home Medications    Prior to Admission medications   Medication Sig Start Date End Date Taking? Authorizing Provider  baclofen (LIORESAL) 10 MG tablet Take 10 mg by mouth 3 (three) times daily as needed for muscle spasms.   Yes [provider]  Cholecalciferol (VITAMIN D3) 5000 units CAPS Take 1 capsule (5,000 Units total) by mouth daily. 06/02/18  Yes Nida,  Marella Chimes, MD  DULoxetine (CYMBALTA) 30 MG capsule Take 1 capsule (30 mg total) by mouth at bedtime. 03/16/18  Yes Jamse Arn, MD  etonogestrel (NEXPLANON) 68 MG IMPL implant 1 each by Subdermal route once.   Yes [provider]  gabapentin (NEURONTIN) 300 MG capsule Take 300 mg by mouth 2 (two) times daily.   Yes [provider]  pantoprazole (PROTONIX) 40 MG tablet Take 1 tablet (40 mg total) by mouth 2 (two) times daily before a meal. 12/14/18  Yes Annitta Needs, NP  promethazine (PHENERGAN) 25 MG tablet Take 0.5 tablets (12.5 mg total) by mouth every 6 (six) hours as needed for nausea or vomiting. 12/14/18  Yes Carlis Stable, NP  cephALEXin (KEFLEX) 500 MG capsule Take 1 capsule (500 mg total) by mouth 2 (two) times daily for 5 days. 03/23/19 03/28/19  Cristabel Bicknell A, PA-C  cyclobenzaprine (FLEXERIL) 5 MG tablet Take 1 tablet (5 mg total) by mouth 3 (three) times daily as needed. Patient not taking: Reported on 12/14/2018 09/07/18   Rolland Porter, MD  dicyclomine (BENTYL) 20 MG tablet Take 1 tablet (20 mg total) by mouth 2 (two) times daily. 03/23/19   Tibor Lemmons A, PA-C  naproxen (NAPROSYN) 250 MG tablet Take 1 po BID with food prn pain Patient not taking: Reported on 12/14/2018 09/07/18   Rolland Porter, MD  ondansetron (ZOFRAN ODT) 4 MG disintegrating tablet Take 1 tablet (4 mg total) by mouth every 8 (eight) hours as needed for nausea or vomiting. 03/23/19   Katheen Aslin A, PA-C  pantoprazole (PROTONIX) 40 MG tablet Take 1 tablet (40 mg total) by mouth daily. 30 minutes before breakfast daily. Patient not taking: Reported on 03/23/2019 08/14/18   Annitta Needs, NP  tiZANidine (ZANAFLEX) 4 MG tablet Take 1 tablet (4 mg total) by mouth every 6 (six) hours as needed for muscle spasms. Patient not taking: Reported on 12/14/2018 04/26/17   Melvenia Beam, MD    Family History Family History  Problem Relation Age of Onset   Cancer Maternal Grandmother        breast     Diabetes Maternal Grandmother    Hypertension Maternal Grandmother    Heart disease Maternal Grandfather    Hypertension Maternal Grandfather    Hypertension Paternal Grandmother    Hypertension Paternal Grandfather    Premature birth Daughter    Asthma Daughter    Diabetes Maternal Aunt    Hypertension Maternal Aunt    Stroke Maternal Aunt    Diabetes Maternal Uncle    Hypertension Maternal Uncle    Hypertension Paternal Aunt    Hypertension Paternal Uncle    Colon cancer Neg Hx    Colon polyps Neg Hx  Social History Social History   Tobacco Use   Smoking status: Never Smoker   Smokeless tobacco: Never Used  Substance Use Topics   Alcohol use: No   Drug use: No     Allergies   Patient has no known allergies.   Review of Systems Review of Systems  Constitutional: Negative.   HENT: Negative.   Respiratory: Negative.   Cardiovascular: Negative.   Gastrointestinal: Positive for abdominal pain and nausea. Negative for abdominal distention, anal bleeding, blood in stool, constipation, diarrhea, rectal pain and vomiting.  Genitourinary: Negative.   Musculoskeletal: Negative.   Skin: Negative.   Neurological: Negative.   All other systems reviewed and are negative.    Physical Exam Updated Vital Signs BP 108/70    Pulse 67    Temp 98.4 F (36.9 C) (Oral)    Resp 11    Ht 5\' 9"  (1.753 m)    Wt 79.4 kg    SpO2 99%    BMI 25.84 kg/m   Physical Exam Vitals signs and nursing note reviewed.  Constitutional:      General: She is not in acute distress.    Appearance: She is well-developed. She is not ill-appearing, toxic-appearing or diaphoretic.  HENT:     Head: Normocephalic and atraumatic.     Mouth/Throat:     Mouth: Mucous membranes are moist.     Pharynx: Oropharynx is clear.  Eyes:     Pupils: Pupils are equal, round, and reactive to light.  Neck:     Musculoskeletal: Normal range of motion.  Cardiovascular:     Rate and  Rhythm: Normal rate.     Heart sounds: Normal heart sounds. No murmur. No friction rub. No gallop.   Pulmonary:     Effort: Pulmonary effort is normal. No respiratory distress.     Breath sounds: Normal breath sounds.     Comments: Speaks in full sentences without difficulty.  No accessory muscle usage.  Lungs clear to auscultation bilateral without wheeze, rhonchi or rales. Abdominal:     General: There is no distension.     Palpations: Abdomen is soft.     Tenderness: There is abdominal tenderness in the epigastric area, periumbilical area, left upper quadrant and left lower quadrant. There is no right CVA tenderness, left CVA tenderness, guarding or rebound. Negative signs include Murphy's sign, Rovsing's sign, McBurney's sign and psoas sign.       Comments: Soft without rebound or guarding.  Tenderness to epigastric, right upper quadrant and right lower quadrant.  No evidence of abdominal wall skin changes.  No obvious herniations.  She has negative Murphy sign.  Negative CVA tap bilaterally.  Musculoskeletal: Normal range of motion.     Comments: Moves all 4 extremities without difficulty.  No lower extremity tenderness of bilateral calves.  Skin:    General: Skin is warm and dry.     Comments: No edema, erythema, ecchymosis or warmth.  Brisk capillary refill.  No rashes or lesions.  Neurological:     Mental Status: She is alert.      ED Treatments / Results  Labs (all labs ordered are listed, but only abnormal results are displayed) Labs Reviewed  COMPREHENSIVE METABOLIC PANEL - Abnormal; Notable for the following components:      Result Value   Calcium 8.6 (*)    AST 14 (*)    All other components within normal limits  URINALYSIS, ROUTINE W REFLEX MICROSCOPIC - Abnormal; Notable for the following components:  APPearance CLOUDY (*)    Hgb urine dipstick SMALL (*)    Leukocytes,Ua TRACE (*)    Bacteria, UA MANY (*)    All other components within normal limits  URINE  CULTURE  LIPASE, BLOOD  CBC WITH DIFFERENTIAL/PLATELET  PREGNANCY, URINE    EKG None  Radiology Ct Abdomen Pelvis W Contrast  Result Date: 03/23/2019 CLINICAL DATA:  Abdominal pain with nausea EXAM: CT ABDOMEN AND PELVIS WITH CONTRAST TECHNIQUE: Multidetector CT imaging of the abdomen and pelvis was performed using the standard protocol following bolus administration of intravenous contrast. CONTRAST:  129mL OMNIPAQUE IOHEXOL 300 MG/ML  SOLN COMPARISON:  None. FINDINGS: Lower chest: There is bibasilar atelectatic change. There is no lung base edema or consolidation. Hepatobiliary: There is hepatic steatosis. No focal liver lesions are demonstrable on this study. Gallbladder wall is not appreciably thickened. There is no biliary duct dilatation. Pancreas: There is no pancreatic mass or inflammatory focus on either side. Spleen: No splenic lesions are evident. Adrenals/Urinary Tract: Adrenals bilaterally appear unremarkable. Kidneys bilaterally show no evident mass or hydronephrosis on either side. There is no renal or ureteral calculus on either side. Urinary bladder is midline with wall thickness within normal limits. Stomach/Bowel: There is no appreciable bowel wall or mesenteric thickening. There is no evident bowel obstruction. There is no free air or portal venous air. Terminal ileum appears unremarkable. Vascular/Lymphatic: No abdominal aortic aneurysm. There is slight atherosclerosis in the aorta. The inferior vena cava is somewhat prominent which may be indicative of a degree of increased right heart pressure. There is no reflux of contrast into the hepatic veins, however. Portal vein is patent are the splenic and superior mesenteric veins. No adenopathy evident in the abdomen or pelvis. Reproductive: Uterus is anteverted.  No pelvic mass evident. Other: Appendix appears normal. No abscess or ascites evident in the abdomen or pelvis. Musculoskeletal: There are no blastic or lytic bone lesions. A  small bone island is noted lateral to the sacroiliac joint on the right in the right inferior iliac crest region. There is no intramuscular or abdominal wall lesion. IMPRESSION: 1. No demonstrable bowel obstruction. No abscess in the abdomen or pelvis. Appendix appears normal. 2. No renal or ureteral calculus. No hydronephrosis. Urinary bladder wall thickness within normal limits. 3.  Hepatic steatosis. 4. Prominence of the inferior vena cava. Significance of this finding uncertain. Question a degree of increase in right heart pressure. Electronically Signed   By: Lowella Grip III M.D.   On: 03/23/2019 15:58    Procedures Procedures (including critical care time)  Medications Ordered in ED Medications  sodium chloride 0.9 % bolus 1,000 mL (0 mLs Intravenous Stopped 03/23/19 1533)  morphine 4 MG/ML injection 4 mg (4 mg Intravenous Given 03/23/19 1426)  ondansetron (ZOFRAN) injection 4 mg (4 mg Intravenous Given 03/23/19 1426)  iohexol (OMNIPAQUE) 300 MG/ML solution 100 mL (100 mLs Intravenous Contrast Given 03/23/19 1536)  HYDROmorphone (DILAUDID) injection 1 mg (1 mg Intravenous Given 03/23/19 1608)  alum & mag hydroxide-simeth (MAALOX/MYLANTA) 200-200-20 MG/5ML suspension 30 mL (30 mLs Oral Given 03/23/19 1608)    And  lidocaine (XYLOCAINE) 2 % viscous mouth solution 15 mL (15 mLs Oral Given 03/23/19 1608)   Initial Impression / Assessment and Plan / ED Course  I have reviewed the triage vital signs and the nursing notes.  Pertinent labs & imaging results that were available during my care of the patient were reviewed by me and considered in my medical decision making (see chart for  details).  33 year old female appears otherwise well presents for evaluation of abdominal pain.  She is afebrile, nonseptic, non-ill-appearing.  Patient with diffuse tenderness to left upper quadrant, left lower quadrant and epigastric region.  Has had persistent nausea however has this at baseline.  Not take  anything for pain.  She has no rebound or guarding.  No concerns for STDs, pelvic pain or vaginal discharge.  Declines GU exam to assess to pelvic pathology as cause of her pain. Similar symptoms few months ago and was told to increase her PPI from [place] [facility].  No fever, emesis, diarrhea, urinary symptoms or constipation.  She does not take NSAIDs, alcohol use.  No history of pancreatitis or ulcers.  Will give fluids, analgesics, antiemetics, IVF, labs, imaging and reevaluate.  1540: Improvement from 10--to 7.  She is no longer nauseous.  Will give additional pain medicine and reevaluate.    Labs and imaging personally reviewed CBC without leukocytosis Metabolic panel without electrolyte, renal or liver abnormality Lipase 24 Urinalysis with possible UTI, will culture EKG without ST/T changes.  No STEMI.  Sinus rhythm.   1700: Pain Controlled in ED.  Has not had any episodes of nausea or emesis.  She is tolerating p.o. intake.  CT scan with mild prominent inferior vena cava however no AP pathology to suggest pain.  Patient without chest pain, shortness of breath, tachycardia or tachypnea.  Low suspicion for ACS, PE or dissection as cause for pain or prominent inferior vena cava.  She does have some mild dysuria on reevaluation.  Urinalysis was possible UTI.  Shared decision making with patient to start on antibiotics versus culture.  Patient would like to be started on antibiotics we will follow-up with culture and DC if negative.  Reevaluation nonsurgical abdomen.  Negative Murphy sign and no right upper quadrant tenderness palpation to suggest gallbladder pathology.  CT without evidence of CBD dilation or wall thickening.  Will DC home with Bentyl, Zofran and have patient follow-up with GI who she follows outpatient.  Patient is nontoxic, nonseptic appearing, in no apparent distress.  Patient's pain and other symptoms adequately managed in emergency department.  Fluid bolus given.  Labs,  imaging and vitals reviewed.  Patient does not meet the SIRS or Sepsis criteria.  On repeat exam patient does not have a surgical abdomin and there are no peritoneal signs.  No indication of appendicitis, bowel obstruction, bowel perforation, cholecystitis, diverticulitis, PID or ectopic pregnancy, torsion, TOA, pancreatitis, gastric ulcer/perforation.  Patient discharged home with symptomatic treatment and given strict instructions for follow-up with their primary care physician.  I have also discussed reasons to return immediately to the ER.  Patient expresses understanding and agrees with plan.  The patient has been appropriately medically screened and/or stabilized in the ED. I have low suspicion for any other emergent medical condition which would require further screening, evaluation or treatment in the ED or require inpatient management.  Patient is hemodynamically stable and in no acute distress.  Patient able to ambulate in department prior to ED.  Evaluation does not show acute pathology that would require ongoing or additional emergent interventions while in the emergency department or further inpatient treatment.  I have discussed the diagnosis with the patient and answered all questions.  Pain is been managed while in the emergency department and patient has no further complaints prior to discharge.  Patient is comfortable with plan discussed in room and is stable for discharge at this time.  I have discussed strict  return precautions for returning to the emergency department.  Patient was encouraged to follow-up with PCP/specialist refer to at discharge.        Final Clinical Impressions(s) / ED Diagnoses   Final diagnoses:  Generalized abdominal pain  Nausea  Acute cystitis without hematuria    ED Discharge Orders         Ordered    dicyclomine (BENTYL) 20 MG tablet  2 times daily     03/23/19 1704    ondansetron (ZOFRAN ODT) 4 MG disintegrating tablet  Every 8 hours PRN      03/23/19 1704    cephALEXin (KEFLEX) 500 MG capsule  2 times daily     03/23/19 1704           Stephanee Barcomb A, PA-C 03/23/19 1710    Maudie Flakes, MD 03/25/19 [phone]

## 2019-03-23 NOTE — ED Triage Notes (Signed)
Left lower and middle quadrant pain for 4 days with nausea, hurts worse to lie down.

## 2019-03-25 LAB — URINE CULTURE: Culture: >=100000 — AB

## 2019-03-26 ENCOUNTER — Telehealth: Payer: Self-pay | Admitting: Emergency Medicine

## 2019-03-26 NOTE — Telephone Encounter (Signed)
Please follow-up with patient's concerns. Left a mychart message regarding abdominal pain, but this really needs to be a telephone call. She presented to the ED.  Still needs to complete US abdomen as previously recommended. May need HIDA.

## 2019-03-26 NOTE — Telephone Encounter (Signed)
Post ED Visit - Positive Culture Follow-up  Culture report reviewed by antimicrobial stewardship pharmacist: [facility] []  Elenor Quinones, Pharm.D. [x]  Heide Guile, Pharm.D., BCPS AQ-ID []  Parks Neptune, Pharm.D., BCPS []  Alycia Rossetti, Pharm.D., BCPS []  [place], Pharm.D., BCPS, AAHIVP []  Legrand Como, Pharm.D., BCPS, AAHIVP []  Salome Arnt, PharmD, BCPS []  Johnnette Gourd, PharmD, BCPS []  Hughes Better, PharmD, BCPS []  Leeroy Cha, PharmD []  Laqueta Linden, PharmD, BCPS []  Albertina Parr, PharmD  [facility] Team []  Leodis Sias, PharmD []  Lindell Spar, PharmD []  Royetta Asal, PharmD []  Graylin Shiver, Rph []  Rema Fendt) Glennon Mac, PharmD []  Arlyn Dunning, PharmD []  Netta Cedars, PharmD []  Dia Sitter, PharmD []  Leone Haven, PharmD []  Gretta Arab, PharmD []  Theodis Shove, PharmD []  Peggyann Juba, PharmD []  Reuel Boom, PharmD   Positive urine culture Treated with cephalexin, organism sensitive to the same and no further patient follow-up is required at this time.  Hazle Nordmann 03/26/2019, 11:11 AM

## 2019-04-10 ENCOUNTER — Telehealth: Payer: Self-pay | Admitting: General Practice

## 2019-04-10 NOTE — Telephone Encounter (Signed)
Called, VM not set up.  

## 2019-04-10 NOTE — Telephone Encounter (Signed)
Please follow-up with patient's concerns. Left a mychart message regarding abdominal pain, but this really needs to be a telephone call. She presented to the ED.  Still needs to complete US abdomen as previously recommended. May need HIDA.

## 2019-04-10 NOTE — Telephone Encounter (Signed)
Routing to [place] for follow-up

## 2019-04-11 NOTE — Telephone Encounter (Signed)
Tried calling pt. VM isn't set up. Not able to leave a message.

## 2019-04-16 NOTE — Telephone Encounter (Signed)
Spoke with pt today. She has an appointment this week 04/18/2019 and plans to schedule her U/S abdomen when she comes in the office. Pt stated she did go to the ED and she is going to f/u with the provider on Wednesday.

## 2019-04-18 ENCOUNTER — Encounter: Payer: Self-pay | Admitting: Gastroenterology

## 2019-04-18 ENCOUNTER — Telehealth: Payer: Self-pay | Admitting: Gastroenterology

## 2019-04-18 ENCOUNTER — Other Ambulatory Visit: Payer: Self-pay

## 2019-04-18 ENCOUNTER — Other Ambulatory Visit: Payer: Self-pay | Admitting: *Deleted

## 2019-04-18 ENCOUNTER — Ambulatory Visit (INDEPENDENT_AMBULATORY_CARE_PROVIDER_SITE_OTHER): Payer: Medicaid Other | Admitting: Gastroenterology

## 2019-04-18 VITALS — BP 115/73 | HR 80 | Temp 96.9°F | Ht 69.0 in | Wt 184.2 lb

## 2019-04-18 DIAGNOSIS — R0989 Other specified symptoms and signs involving the circulatory and respiratory systems: Secondary | ICD-10-CM | POA: Diagnosis not present

## 2019-04-18 DIAGNOSIS — R109 Unspecified abdominal pain: Secondary | ICD-10-CM | POA: Diagnosis not present

## 2019-04-18 DIAGNOSIS — R1310 Dysphagia, unspecified: Secondary | ICD-10-CM

## 2019-04-18 DIAGNOSIS — R079 Chest pain, unspecified: Secondary | ICD-10-CM

## 2019-04-18 DIAGNOSIS — Z1159 Encounter for screening for other viral diseases: Secondary | ICD-10-CM

## 2019-04-18 DIAGNOSIS — R198 Other specified symptoms and signs involving the digestive system and abdomen: Secondary | ICD-10-CM

## 2019-04-18 NOTE — Progress Notes (Signed)
Referring Provider: Raiford Simmonds., PA-C Primary Care Physician:  Raiford Simmonds., PA-C  Primary GI: Dr. Gala Romney   Chief Complaint  Patient presents with  . Abdominal Pain    LLQ, radiates to LUQ    HPI:   Linda Reid is a 33 y.o. female presenting today with a history of globus sensation, solid food dysphagia, left-sided abdominal pain.Previously evaluated by ENT in past (2018). EGD Jan 2020  with mild Schatzki ring s/p dilation, normal stomach and duodenum. History of thyroid nodule and right thyroidectomy, with CT neck and thyroid ultrasound on file without concerning findings. Query LPR as culprit. Referred to ENT for repeat evaluation and increased PPI to BID at last visit.   Abdominal pain: [name] had been ordered but not completed. Concern for biliary etiology previously. CT abd/pelvis with contrast while in ED July 2020: fatty liver. Prominence of IVC. Dr. Benjamine Mola completed laryngoscopy per patient's report. BPE also completed in May 2020 by DR. Teoh with brief sticking of the barium tablet just above the aortic arch with reproduction of patient symptoms, no visible stricture, no other abnormality.   Had ED visit in interim from last visit. Pain in LLQ and up to LUQ, right under breast. Pain is worse when laying down. Was present for 4-5 days straight when went to ED. Now just coming and going. GERD: trying to follow a good diet. Eating a sandwich or salad can make her feel like food will come back up. Feels like food is not completely going down. Still with dysphagia. Still with globus feeling.  Protonix BID. BM daily. Sometimes slight strain. Sometimes feels complete BM, sometimes not.  No rectal bleeding.  Not taking Bentyl. Bentyl helped a bit with discomfort.   Notes left-sided chest discomfort at rest. Intermittent chest pain and chest pressure. Concerned and would like a cardiology referral.   Past Medical History:  Diagnosis Date  . Anxiety   . Arthritis    lower  back, right leg  . Back pain   . Cerebrospinal fluid leak from spinal puncture 03/03/2016   after epidural steroid injection; states has had 2 blood patch procedures   . Difficulty swallowing solids    certain foods  . Fibromyalgia   . GERD (gastroesophageal reflux disease)   . Hyperthyroidism   . Migraines   . Nasal congestion 12/06/2016  . Neck pain of over 3 months duration    posterior and right side  . Neuropathy    right leg  . Restless leg syndrome   . Thyroid nodule 11/2016   right  . Tracheal deviation    due to thyroid nodule, per pt.  . Urinary frequency     Past Surgical History:  Procedure Laterality Date  . DG MYLEOGRAM LUMBAR SPINE (Irene HX)    . DILATION AND CURETTAGE OF UTERUS  06/25/2002  . ESOPHAGOGASTRODUODENOSCOPY (EGD) WITH PROPOFOL N/A 08/28/2018   mild Schatzki ring s/p dilation, normal stomach and duodenum  . MALONEY DILATION N/A 08/28/2018   Procedure: [name] DILATION;  Surgeon: Daneil Dolin, MD;  Location: [facility];  Service: Endoscopy;  Laterality: N/A;  . THYROIDECTOMY Right 12/13/2016   Procedure: RIGHT HEMI-THYROIDECTOMY;  Surgeon: Leta Baptist, MD;  Location: [place];  Service: ENT;  Laterality: Right;  . TONSILLECTOMY      Current Outpatient Medications  Medication Sig Dispense Refill  . baclofen (LIORESAL) 10 MG tablet Take 10 mg by mouth 3 (three) times daily as needed for muscle  spasms.    . Cholecalciferol (VITAMIN D3) 5000 units CAPS Take 1 capsule (5,000 Units total) by mouth daily. 90 capsule 0  . dicyclomine (BENTYL) 20 MG tablet Take 1 tablet (20 mg total) by mouth 2 (two) times daily. 20 tablet 0  . DULoxetine (CYMBALTA) 30 MG capsule Take 1 capsule (30 mg total) by mouth at bedtime. 30 capsule 1  . etonogestrel (NEXPLANON) 68 MG IMPL implant 1 each by Subdermal route once.    . gabapentin (NEURONTIN) 300 MG capsule Take 300 mg by mouth 2 (two) times daily.    . ondansetron (ZOFRAN ODT) 4 MG disintegrating tablet  Take 1 tablet (4 mg total) by mouth every 8 (eight) hours as needed for nausea or vomiting. 20 tablet 0  . pantoprazole (PROTONIX) 40 MG tablet Take 1 tablet (40 mg total) by mouth 2 (two) times daily before a meal. 180 tablet 3  . promethazine (PHENERGAN) 25 MG tablet Take 0.5 tablets (12.5 mg total) by mouth every 6 (six) hours as needed for nausea or vomiting. 30 tablet 0  . cyclobenzaprine (FLEXERIL) 5 MG tablet Take 1 tablet (5 mg total) by mouth 3 (three) times daily as needed. (Patient not taking: Reported on 12/14/2018) 30 tablet 0  . naproxen (NAPROSYN) 250 MG tablet Take 1 po BID with food prn pain (Patient not taking: Reported on 12/14/2018) 20 tablet 0  . pantoprazole (PROTONIX) 40 MG tablet Take 1 tablet (40 mg total) by mouth daily. 30 minutes before breakfast daily. (Patient not taking: Reported on 03/23/2019) 30 tablet 3  . tiZANidine (ZANAFLEX) 4 MG tablet Take 1 tablet (4 mg total) by mouth every 6 (six) hours as needed for muscle spasms. (Patient not taking: Reported on 12/14/2018) 30 tablet 0   Current Facility-Administered Medications  Medication Dose Route Frequency Provider Last Rate Last Dose  . triamcinolone acetonide (KENALOG) 10 MG/ML injection 10 mg  10 mg Other Once Landis Martins, DPM        Allergies as of 04/18/2019  . (No Known Allergies)    Family History  Problem Relation Age of Onset  . Cancer Maternal Grandmother        breast  . Diabetes Maternal Grandmother   . Hypertension Maternal Grandmother   . Heart disease Maternal Grandfather   . Hypertension Maternal Grandfather   . Hypertension Paternal Grandmother   . Hypertension Paternal Grandfather   . Premature birth Daughter   . Asthma Daughter   . Diabetes Maternal Aunt   . Hypertension Maternal Aunt   . Stroke Maternal Aunt   . Diabetes Maternal Uncle   . Hypertension Maternal Uncle   . Hypertension Paternal Aunt   . Hypertension Paternal Uncle   . Colon cancer Neg Hx   . Colon polyps Neg Hx      Social History   Socioeconomic History  . Marital status: Legally Separated    Spouse name: Aadhya Hira   . Number of children: 3  . Years of education: 4 Bachelor  . Highest education level: Not on file  Occupational History  . 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 Financial resource strain: Not on file  . Food insecurity    Worry: Not on file    Inability: Not on file  . Transportation needs    Medical: Not on file    Non-medical: Not on file  Tobacco Use  . Smoking status: Never  Smoker  . Smokeless tobacco: Never Used  Substance and Sexual Activity  . Alcohol use: No  . Drug use: No  . Sexual activity: Yes    Birth control/protection: Implant  Lifestyle  . Physical activity    Days per week: Not on file    Minutes per session: Not on file  . Stress: Not on file  Relationships  . Social Herbalist on phone: Not on file    Gets together: Not on file    Attends religious service: Not on file    Active member of club or organization: Not on file    Attends meetings of clubs or organizations: Not on file    Relationship status: Not on file  Other Topics Concern  . Not on file  Social History Narrative   Patient lives with her fiance and 3 children.    Her fiance is the father of her current pregnancy, other three children were fathered by her previous partner.    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     Caffeine use: none   Right-handed    Review of Systems: Gen: Denies fever, chills, anorexia. Denies fatigue, weakness, weight loss.  CV: Denies chest pain, palpitations, syncope, peripheral edema, and claudication. Resp: Denies dyspnea at rest, cough, wheezing, coughing up blood, and pleurisy. GI: see HPI  Derm: Denies rash, itching, dry skin Psych: Denies  depression, anxiety, memory loss, confusion. No homicidal or suicidal ideation.  Heme: Denies bruising, bleeding, and enlarged lymph nodes.  Physical Exam: BP 115/73   Pulse 80   Temp (!) 96.9 F (36.1 C) (Temporal)   Ht 5\' 9"  (1.753 m)   Wt 184 lb 3.2 oz (83.6 kg)   BMI 27.20 kg/m  General:   Alert and oriented. No distress noted. Pleasant and cooperative.  Head:  Normocephalic and atraumatic. Eyes:  Conjuctiva clear without scleral icterus. Abdomen:  +BS, soft, mild TTP LLQ and non-distended. No rebound or guarding. No HSM or masses noted. Msk:  Symmetrical without gross deformities. Normal posture. Extremities:  Without edema. Neurologic:  Alert and  oriented x4 Psych:  Alert and cooperative. Normal mood and affect.

## 2019-04-18 NOTE — Telephone Encounter (Signed)
I have spoken with the patient. Explained briefly the procedure. COVID 19 screening scheduled for 05/05/19 She is scheduled for 05/09/19 for esophageal manometry. Written information through her My Chart account.

## 2019-04-18 NOTE — Patient Instructions (Addendum)
We have referred you to Cardiology.  I have also referred you for a manometry in [place].  Start taking Benefiber 1 teaspoon daily up to 3 teaspoons a day. Start out slow and increase as tolerated.   We will see you back in 3-4 months!  I enjoyed seeing you again today! As you know, I value our relationship and want to provide genuine, compassionate, and quality care. I welcome your feedback. If you receive a survey regarding your visit,  I greatly appreciate you taking time to fill this out. See you next time!  Annitta Needs, PhD, ANP-BC [facility] Gastroenterology   Esophageal Manometry  Esophageal manometry is a test that measures how well your esophagus is working. The esophagus is a muscular tube that connects your mouth to your stomach. When your esophagus is working properly, it moves food and liquid smoothly into your stomach and keeps it from coming back up into your mouth. It also keeps stomach juices from flowing out of your stomach and into your esophagus (reflux). This test may be ordered if you have symptoms that suggest that your esophagus is not working properly. Symptoms may include:  Trouble swallowing (dysphagia).  Heartburn.  Chest pain with reflux.  Choking.  A sore throat.  Chronic coughing. Tell a health care provider about:  Any allergies you have.  All medicines you are taking, including vitamins, herbs, eye drops, creams, and over-the-counter medicines.  Any problems you or family members have had with anesthetic medicines.  Any blood disorders you have.  Any surgeries you have had.  Any medical conditions you have.  Whether you are pregnant or may be pregnant. What are the risks? Generally, this is a safe procedure. However, problems may occur, including:  Sore throat.  Nosebleed.  Pneumonia, if fluids from the esophagus get into your windpipe and lungs (aspiration pneumonia).  A tear (perforation) in the esophagus. This is  rare. What happens before the procedure?  Ask your health care provider about: ? Changing or stopping your regular medicines. This is especially important if you are taking diabetes medicines or blood thinners. ? Taking medicines such as aspirin and ibuprofen. These medicines can thin your blood. Do not take these medicines unless your health care provider tells you to take them. ? Taking over-the-counter medicines, vitamins, herbs, and supplements.  Follow your health care provider's instructions about eating or drinking restrictions. What happens during the procedure?  A numbing medicine (local anesthetic) will be applied or sprayed inside your nose and down your throat.  A long, thin, pressure-sensing tube will be put through your nose and down into your esophagus. You will be asked to swallow as the tube goes in.  Pressure measurements will be taken in different parts of your esophagus.  You will be given some fluid to swallow. You will be asked to change your position a few times during the procedure. The procedure may vary among health care providers and hospitals. What can I expect after the procedure?  Let your health care provider know if you have: ? Bleeding from your nose. ? An ongoing (persistent) sore throat or coughing. ? Chest pain. ? A fever. ? Trouble swallowing.  Keep all follow-up visits as directed by your health care provider. This is important. Follow these instructions at home:  You may resume your regular activities, including your regular diet, unless your health care provider tells you not to.  Ask your health care provider, or the department that is doing the procedure: ?  When will my results be ready? ? How will I get my results? ? What are my treatment options? ? What other tests do I need? ? What are my next steps? Summary  Esophageal manometry is a test that measures how well the esophagus is working.  You may have this test if you have  symptoms that suggest that your esophagus is not working properly.  For the test, a long, thin, pressure-sensing tube will be put through your nose and down into your esophagus. Then, pressure measurements will be taken in different parts of your esophagus as you swallow. This information is not intended to replace advice given to you by your health care provider. Make sure you discuss any questions you have with your health care provider. Document Released: 12/10/2004 Document Revised: 08/11/2017 Document Reviewed: 08/11/2017 Elsevier Patient Education  2020 [employer].

## 2019-04-19 NOTE — Telephone Encounter (Signed)
Okay thank you

## 2019-04-25 DIAGNOSIS — R079 Chest pain, unspecified: Secondary | ICD-10-CM | POA: Insufficient documentation

## 2019-04-25 NOTE — Assessment & Plan Note (Signed)
Chest pain/pressure at rest. Intermittent. Query GERD component. EGD on file, pursuing manometry. Patient requesting cardiology referral, which is certainly appropriate to rule out any non-GI component although less likely. Cardiology referral requested.

## 2019-04-25 NOTE — Assessment & Plan Note (Signed)
Persistent globus sensation, solid food dysphagia, despite EGD Jan 2020 with dilation of mild Schatzki ring. BPE May 2020 by Dr. Benjamine Mola with brief sticking of the barium tablet just above the aortic arch with reproduction of patient symptoms, no visible stricture, no other abnormality. Employing GERD diet changes and continues PPI BID. Will pursue manometry.

## 2019-04-25 NOTE — Assessment & Plan Note (Signed)
Left-sided abdominal pain likely related to underlying constipation. CT on file with fatty liver but no acute findings. Will trial supplemental fiber and may need prescriptive agent. Return in 3-4 months.

## 2019-05-05 ENCOUNTER — Other Ambulatory Visit (HOSPITAL_COMMUNITY)
Admission: RE | Admit: 2019-05-05 | Discharge: 2019-05-05 | Disposition: A | Discharge status: Home / Self Care | Payer: BC Managed Care – PPO | Source: Ambulatory Visit | Attending: Gastroenterology | Admitting: Gastroenterology

## 2019-05-05 DIAGNOSIS — Z01812 Encounter for preprocedural laboratory examination: Secondary | ICD-10-CM | POA: Insufficient documentation

## 2019-05-05 DIAGNOSIS — Z20828 Contact with and (suspected) exposure to other viral communicable diseases: Secondary | ICD-10-CM | POA: Diagnosis not present

## 2019-05-06 LAB — NOVEL CORONAVIRUS, NAA (HOSP ORDER, SEND-OUT TO REF LAB; TAT 18-24 HRS): SARS-CoV-2, NAA: NOT DETECTED

## 2019-05-07 ENCOUNTER — Encounter: Payer: Self-pay | Admitting: Gastroenterology

## 2019-05-08 NOTE — Progress Notes (Signed)
Spoke with patient, manometry tomorrow with arrival time of 0815.  Patient has quarantined without symptoms since testing for covid 19.  Answered questions aware to be NPO after MN.

## 2019-05-09 ENCOUNTER — Encounter (HOSPITAL_COMMUNITY)
Admission: RE | Disposition: A | Discharge status: Home / Self Care | Payer: Self-pay | Source: Home / Self Care | Attending: Gastroenterology

## 2019-05-09 ENCOUNTER — Ambulatory Visit (HOSPITAL_COMMUNITY)
Admission: RE | Admit: 2019-05-09 | Discharge: 2019-05-09 | Disposition: A | Discharge status: Home / Self Care | Payer: BC Managed Care – PPO | Attending: Gastroenterology | Admitting: Gastroenterology

## 2019-05-09 DIAGNOSIS — R1310 Dysphagia, unspecified: Secondary | ICD-10-CM | POA: Diagnosis not present

## 2019-05-09 DIAGNOSIS — R0989 Other specified symptoms and signs involving the circulatory and respiratory systems: Secondary | ICD-10-CM | POA: Diagnosis not present

## 2019-05-09 HISTORY — PX: ESOPHAGEAL MANOMETRY: SHX5429

## 2019-05-09 SURGERY — MANOMETRY, ESOPHAGUS
Anesthesia: Choice

## 2019-05-09 MED ORDER — LIDOCAINE VISCOUS HCL 2 % MT SOLN
OROMUCOSAL | Status: AC
  Filled 2019-05-09: qty 15

## 2019-05-09 SURGICAL SUPPLY — 2 items
FACESHIELD LNG OPTICON STERILE (SAFETY) IMPLANT
GLOVE BIO SURGEON STRL SZ8 (GLOVE) ×4 IMPLANT

## 2019-05-09 NOTE — Progress Notes (Signed)
Esophageal manometry done per protocol.  Patient tolerated well.  Report to be sent to Dr Kavitha Nandigam.   

## 2019-05-10 ENCOUNTER — Ambulatory Visit (INDEPENDENT_AMBULATORY_CARE_PROVIDER_SITE_OTHER): Payer: BC Managed Care – PPO | Admitting: Otolaryngology

## 2019-05-10 DIAGNOSIS — R1312 Dysphagia, oropharyngeal phase: Secondary | ICD-10-CM

## 2019-05-10 DIAGNOSIS — R0982 Postnasal drip: Secondary | ICD-10-CM | POA: Diagnosis not present

## 2019-05-11 ENCOUNTER — Encounter (HOSPITAL_COMMUNITY): Payer: Self-pay | Admitting: Gastroenterology

## 2019-05-21 DIAGNOSIS — R1310 Dysphagia, unspecified: Secondary | ICD-10-CM

## 2019-05-22 ENCOUNTER — Encounter: Payer: Self-pay | Admitting: Women's Health

## 2019-05-22 ENCOUNTER — Other Ambulatory Visit: Payer: Self-pay

## 2019-05-22 ENCOUNTER — Ambulatory Visit (INDEPENDENT_AMBULATORY_CARE_PROVIDER_SITE_OTHER): Payer: BC Managed Care – PPO | Admitting: Women's Health

## 2019-05-22 VITALS — BP 114/78 | HR 72 | Ht 69.0 in | Wt 182.6 lb

## 2019-05-22 DIAGNOSIS — Z30011 Encounter for initial prescription of contraceptive pills: Secondary | ICD-10-CM

## 2019-05-22 DIAGNOSIS — Z3046 Encounter for surveillance of implantable subdermal contraceptive: Secondary | ICD-10-CM | POA: Diagnosis not present

## 2019-05-22 MED ORDER — NORETHINDRONE 0.35 MG PO TABS: 1.0000 | ORAL_TABLET | Freq: Every day | ORAL | 11 refills | Status: DC

## 2019-05-22 NOTE — Progress Notes (Signed)
   [place] [place] REMOVAL Patient name: Linda Reid MRN [id number]  Date of birth: [birth year] Subjective Findings:   Linda Reid is a 33 y.o. [phone] African American female being seen today for removal of a Nexplanon. Her Nexplanon was placed 08/26/15.  She desires removal because it is past time. Signed copy of informed consent in chart.   No LMP recorded. Patient has had an implant. Last pap 05/03/19 w/ PCP. Results were:  normal The planned method of family planning is doesn't want nexplanon again, wants to try something else. Does have migraines w/ aura, discussed progestin-only methods, wants POPs Pertinent History Reviewed:   Reviewed past medical,surgical, social, obstetrical and family history.  Reviewed problem list, medications and allergies. Objective Findings & Procedure:    Vitals:   05/22/19 1404  BP: 114/78  Pulse: 72  Weight: 182 lb 9.6 oz (82.8 kg)  Height: 5\' 9"  (1.753 m)  Body mass index is 26.97 kg/m.  No results found for this or any previous visit (from the past 24 hour(s)).   Time out was performed.  Nexplanon site identified.  Area prepped in usual sterile fashon. One cc of 2% lidocaine was used to anesthetize the area at the distal end of the implant. A small stab incision was made right beside the implant on the distal portion.  The Nexplanon rod was grasped using hemostats and removed without difficulty.  There was less than 3 cc blood loss. There were no complications.  Steri-strips were applied over the small incision and a pressure bandage was applied.  The patient tolerated the procedure well. Assessment & Plan:   1) Nexplanon removal She was instructed to keep the area clean and dry, remove pressure bandage in 24 hours, and keep insertion site covered with the steri-strip for 3-5 days.   Follow-up PRN problems.  2) Contraception management> Rx micronor w/ 11RF (d/t migraines w/ aura), condoms x2wks, understands has to take at exact same  time daily to be effective, if late taking use condom as back-up   No orders of the defined types were placed in this encounter.   Follow-up: Return in about 1 year (around 05/21/2020) for F/U.  [place], [facility] 05/22/2019 2:49 PM

## 2019-05-22 NOTE — Patient Instructions (Signed)
Keep the area clean and dry.  You can remove the big bandage in 24 hours, and the small steri-strip bandage in 3-5 days.  

## 2019-05-23 DIAGNOSIS — R922 Inconclusive mammogram: Secondary | ICD-10-CM | POA: Diagnosis not present

## 2019-05-23 DIAGNOSIS — N6489 Other specified disorders of breast: Secondary | ICD-10-CM | POA: Diagnosis not present

## 2019-05-31 ENCOUNTER — Other Ambulatory Visit: Payer: Self-pay

## 2019-05-31 DIAGNOSIS — Z20828 Contact with and (suspected) exposure to other viral communicable diseases: Secondary | ICD-10-CM | POA: Diagnosis not present

## 2019-05-31 DIAGNOSIS — Z20822 Contact with and (suspected) exposure to covid-19: Secondary | ICD-10-CM

## 2019-06-02 LAB — NOVEL CORONAVIRUS, NAA: SARS-CoV-2, NAA: NOT DETECTED

## 2019-06-04 ENCOUNTER — Other Ambulatory Visit: Payer: Self-pay

## 2019-06-04 DIAGNOSIS — Z20828 Contact with and (suspected) exposure to other viral communicable diseases: Secondary | ICD-10-CM | POA: Diagnosis not present

## 2019-06-04 DIAGNOSIS — Z20822 Contact with and (suspected) exposure to covid-19: Secondary | ICD-10-CM

## 2019-06-05 LAB — NOVEL CORONAVIRUS, NAA: SARS-CoV-2, NAA: NOT DETECTED

## 2019-07-20 ENCOUNTER — Ambulatory Visit: Payer: Medicaid Other | Admitting: Gastroenterology

## 2019-07-26 ENCOUNTER — Encounter: Payer: Self-pay | Admitting: Gastroenterology

## 2019-07-26 ENCOUNTER — Other Ambulatory Visit: Payer: Self-pay

## 2019-07-26 ENCOUNTER — Other Ambulatory Visit: Payer: Self-pay | Admitting: *Deleted

## 2019-07-26 ENCOUNTER — Ambulatory Visit (INDEPENDENT_AMBULATORY_CARE_PROVIDER_SITE_OTHER): Payer: BC Managed Care – PPO | Admitting: Gastroenterology

## 2019-07-26 ENCOUNTER — Telehealth: Payer: Self-pay | Admitting: *Deleted

## 2019-07-26 VITALS — BP 109/75 | HR 80 | Temp 96.0°F | Ht 69.0 in | Wt 180.6 lb

## 2019-07-26 DIAGNOSIS — R110 Nausea: Secondary | ICD-10-CM

## 2019-07-26 DIAGNOSIS — K219 Gastro-esophageal reflux disease without esophagitis: Secondary | ICD-10-CM

## 2019-07-26 DIAGNOSIS — R079 Chest pain, unspecified: Secondary | ICD-10-CM

## 2019-07-26 MED ORDER — DEXLANSOPRAZOLE 60 MG PO CPDR: 60.0000 mg | DELAYED_RELEASE_CAPSULE | Freq: Every day | ORAL | 3 refills | Status: DC

## 2019-07-26 NOTE — Telephone Encounter (Signed)
GES scheduled for 12/8 at 8:00am, arrival 7:45am, npo midnight, no stomach medications.  Called pt and is aware of appt details. She voiced understanding.

## 2019-07-26 NOTE — Patient Instructions (Signed)
I sent Dexilant to your pharmacy. Let's see how much it is. This may require a prior authorization. You will take it once a day in the morning. Don't take Protonix while on Dexilant.  At night, you can take Pepcid (famotidine) as needed.   We are arranging the gastric emptying study.  We will also get you plugged in with cardiology.  Will see you in 3 months!  Have a wonderful holiday season!  I enjoyed seeing you again today! As you know, I value our relationship and want to provide genuine, compassionate, and quality care. I welcome your feedback. If you receive a survey regarding your visit,  I greatly appreciate you taking time to fill this out. See you next time!  Annitta Needs, PhD, ANP-BC [facility] Gastroenterology

## 2019-07-26 NOTE — Progress Notes (Signed)
Referring Provider: Raiford Simmonds., PA-C Primary Care Physician:  Glenda Chroman, MD Primary GI: Dr Gala Romney    Chief Complaint  Patient presents with  . Follow-up    nausea continues at night and keeps her up    HPI:   Linda Reid is a 33 y.o. female presenting today with a history of globus sensation, solid food dysphagia, abdominal pain. EGD on file from earlier this year with Schatzki's ring s/p dilation. History of thyroid nodule and right thyroidectomy, with CT neck and thyroid ultrasound on file without concerning findings  Abdominal pain: at last visit felt related to constipation. Constipation resolved with Benefiber. No further abdominal pain.   Chest pain: referred to cardiology at last visit. Somehow appt has not been arranged yet. Noted at rest. Intermittent pain and pressure. Will refer again.   Dysphagia, globus sensation: completed manometry. She had normal relaxation of the EG junction, no significant esophageal peristaltic abnormality detected on study. PPI BID controls reflux. Still has vague globus sensation at times   Nausea: occurs with laying down at night. Only in evening times. Taking Protonix before breakfast and dinner. Takes promethazine as needed. Nausea will keep her up at night. Promethazine has to be taken every night before bed. Has to take at 9 or 10 pm otherwise feels lethargic in the morning. Has tried OTC agents such as Prilosec, Nexium. Feels full off of small amounts of food. Gets bloated a lot. Not eating late at night.    Takes Cymbalta and Neurontin at night. Has nausea before taking Cymbalta.   Past Medical History:  Diagnosis Date  . Anxiety   . Arthritis    lower back, right leg  . Back pain   . Cerebrospinal fluid leak from spinal puncture 03/03/2016   after epidural steroid injection; states has had 2 blood patch procedures   . Difficulty swallowing solids    certain foods  . Fibromyalgia   . GERD (gastroesophageal  reflux disease)   . Hyperthyroidism   . Migraines   . Nasal congestion 12/06/2016  . Neck pain of over 3 months duration    posterior and right side  . Neuropathy    right leg  . Restless leg syndrome   . Thyroid nodule 11/2016   right  . Tracheal deviation    due to thyroid nodule, per pt.  . Urinary frequency     Past Surgical History:  Procedure Laterality Date  . DG MYLEOGRAM LUMBAR SPINE ([place] [facility])    . DILATION AND CURETTAGE OF UTERUS  06/25/2002  . ESOPHAGEAL MANOMETRY N/A 05/09/2019   normal relaxation of the EG junction, no significant esophageal peristaltic abnormality detected on study  . ESOPHAGOGASTRODUODENOSCOPY (EGD) WITH PROPOFOL N/A 08/28/2018   mild Schatzki ring s/p dilation, normal stomach and duodenum  . MALONEY DILATION N/A 08/28/2018   Procedure: [name] DILATION;  Surgeon: Daneil Dolin, MD;  [facility];  Service: Endoscopy;  Laterality: N/A;  . THYROIDECTOMY Right 12/13/2016   Procedure: RIGHT HEMI-THYROIDECTOMY;  Surgeon: Leta Baptist, MD;  Location: [facility];  Service: ENT;  Laterality: Right;  . TONSILLECTOMY      Current Outpatient Medications  Medication Sig Dispense Refill  . baclofen (LIORESAL) 10 MG tablet Take 10 mg by mouth 3 (three) times daily as needed for muscle spasms.    . Cholecalciferol (VITAMIN D3) 5000 units CAPS Take 1 capsule (5,000 Units total) by mouth daily. 90 capsule 0  . cyclobenzaprine (FLEXERIL)  5 MG tablet Take 1 tablet (5 mg total) by mouth 3 (three) times daily as needed. 30 tablet 0  . DULoxetine (CYMBALTA) 30 MG capsule Take 1 capsule (30 mg total) by mouth at bedtime. 30 capsule 1  . gabapentin (NEURONTIN) 300 MG capsule Take 300 mg by mouth 2 (two) times daily.    . norethindrone (MICRONOR) 0.35 MG tablet Take 1 tablet (0.35 mg total) by mouth daily. 1 Package 11  . pantoprazole (PROTONIX) 40 MG tablet Take 1 tablet (40 mg total) by mouth 2 (two) times daily before a meal. 180 tablet 3  .  promethazine (PHENERGAN) 25 MG tablet Take 0.5 tablets (12.5 mg total) by mouth every 6 (six) hours as needed for nausea or vomiting. (Patient taking differently: Take 25 mg by mouth every 6 (six) hours as needed for nausea or vomiting. ) 30 tablet 0  . dexlansoprazole (DEXILANT) 60 MG capsule Take 1 capsule (60 mg total) by mouth daily. 90 capsule 3   Current Facility-Administered Medications  Medication Dose Route Frequency Provider Last Rate Last Dose  . triamcinolone acetonide (KENALOG) 10 MG/ML injection 10 mg  10 mg Other Once Landis Martins, DPM        Allergies as of 07/26/2019  . (No Known Allergies)    Family History  Problem Relation Age of Onset  . Cancer Maternal Grandmother        breast  . Diabetes Maternal Grandmother   . Hypertension Maternal Grandmother   . Heart disease Maternal Grandfather   . Hypertension Maternal Grandfather   . Hypertension Paternal Grandmother   . Hypertension Paternal Grandfather   . Premature birth Daughter   . Asthma Daughter   . Hypertension Mother   . Diabetes Maternal Aunt   . Hypertension Maternal Aunt   . Stroke Maternal Aunt   . Diabetes Maternal Uncle   . Hypertension Maternal Uncle   . Hypertension Paternal Aunt   . Hypertension Paternal Uncle   . Colon cancer Neg Hx   . Colon polyps Neg Hx     Social History   Socioeconomic History  . Marital status: Legally Separated    Spouse name: Linda Reid   . Number of children: 3  . Years of education: 4 Bachelor  . Highest education level: Not on file  Occupational History  . 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 Financial resource strain: Not on file  . Food insecurity    Worry: Not on file    Inability: Not on file  . Transportation needs    Medical: Not on file    Non-medical: Not on file  Tobacco Use  . Smoking status: Never Smoker  . Smokeless tobacco: Never Used  Substance and  Sexual Activity  . Alcohol use: No  . Drug use: No  . Sexual activity: Yes    Birth control/protection: Implant  Lifestyle  . Physical activity    Days per week: Not on file    Minutes per session: Not on file  . Stress: Not on file  Relationships  . Social Herbalist on phone: Not on file    Gets together: Not on file    Attends religious service: Not on file    Active member of club or organization: Not on file    Attends meetings of clubs or organizations: Not on file    Relationship status: Not on  file  Other Topics Concern  . Not on file    Review of Systems: Gen: Denies fever, chills, anorexia. Denies fatigue, weakness, weight loss.  CV: Denies chest pain, palpitations, syncope, peripheral edema, and claudication. Resp: Denies dyspnea at rest, cough, wheezing, coughing up blood, and pleurisy. GI: see HPI Derm: Denies rash, itching, dry skin Psych: Denies depression, anxiety, memory loss, confusion. No homicidal or suicidal ideation.  Heme: Denies bruising, bleeding, and enlarged lymph nodes.  Physical Exam: BP 109/75   Pulse 80   Temp (!) 96 F (35.6 C) (Temporal)   Ht 5\' 9"  (1.753 m)   Wt 180 lb 9.6 oz (81.9 kg)   LMP 07/12/2019 (Approximate)   BMI 26.67 kg/m  General:   Alert and oriented. No distress noted. Pleasant and cooperative.  Head:  Normocephalic and atraumatic. Eyes:  Conjuctiva clear without scleral icterus. Abdomen:  +BS, soft, non-tender and non-distended. No rebound or guarding. No HSM or masses noted. Msk:  Symmetrical without gross deformities. Normal posture. Extremities:  Without edema. Neurologic:  Alert and  oriented x4 Psych:  Alert and cooperative. Normal mood and affect.  ASSESSMENT: Linda Reid is a 33 y.o. female presenting today with long-standing GERD, nausea, abdominal pain, globus sensation, dysphagia, returning for routine follow-up.  Abdominal pain: resolved with improved bowel regimen using Benefiber.  Continue this.   GERD: controlled overall with Protonix BID. Still with globus sensation but has been evaluated by ENT, BPE with brief sticking of barium tablet above aortic arch with reproduction of symptoms. EGD on file Jan 2020. Manometry normal.   Nausea: notably in evenings and persistent despite PPI BID, dietary modification and avoidance of late night eating. Early satiety and bloating during the day. Has not had GES yet. Could possibly be dealing with delayed gastric emptying, idiopathic etiology. Doubt med-related as no new meds and nausea present prior to evening meds. No alarm signs/symtpoms. May trial Pepcid prn at night.   Chest pain at rest: with pressure. refer to cardiology again. Likely GERD contributing. Manometry normal. Patient previously requested cardiology referral, which was placed but not completed. Will refer again.    PLAN:  Stop Protonix. Trial of Dexilant. Prescription sent to pharmacy GES Pepcid prn at night Continue diet/behavior modifications Continue Benefiber Referral to cardiology Follow-up in 3 months   Annitta Needs, PhD, ANP-BC [facility]

## 2019-07-30 ENCOUNTER — Encounter: Payer: Self-pay | Admitting: Gastroenterology

## 2019-07-31 ENCOUNTER — Other Ambulatory Visit: Payer: Self-pay

## 2019-07-31 ENCOUNTER — Encounter (HOSPITAL_COMMUNITY): Payer: Self-pay

## 2019-07-31 ENCOUNTER — Encounter (HOSPITAL_COMMUNITY)
Admission: RE | Admit: 2019-07-31 | Discharge: 2019-07-31 | Disposition: A | Discharge status: Home / Self Care | Payer: BC Managed Care – PPO | Source: Ambulatory Visit | Attending: Gastroenterology | Admitting: Gastroenterology

## 2019-07-31 DIAGNOSIS — R109 Unspecified abdominal pain: Secondary | ICD-10-CM | POA: Diagnosis not present

## 2019-07-31 DIAGNOSIS — R110 Nausea: Secondary | ICD-10-CM | POA: Diagnosis not present

## 2019-07-31 DIAGNOSIS — R112 Nausea with vomiting, unspecified: Secondary | ICD-10-CM | POA: Diagnosis not present

## 2019-07-31 MED ORDER — TECHNETIUM TC 99M SULFUR COLLOID
2.0000 | Freq: Once | INTRAVENOUS | Status: AC | PRN
  Administered 2019-07-31: 2 via ORAL

## 2019-08-06 ENCOUNTER — Telehealth: Payer: Self-pay

## 2019-08-06 NOTE — Telephone Encounter (Signed)
PA for Dexilant 60 mg has been submitted on [email or web address]. waiting on an approval or denial.

## 2019-08-20 NOTE — Progress Notes (Signed)
CARDIOLOGY CONSULT NOTE       Patient ID: Linda Reid MRN: [id number] DOB/AGE: 35/18/1987 35 y.o.  Admit date: (Not on file) Referring Physician: Vyas Primary Physician: Glenda Chroman, MD Primary Cardiologist: new Reason for Consultation: Chest pain  Active Problems:   * No active hospital problems. *   HPI:  33 y.o. with history of anxiety, arthritis, reflux, and hyperthyroidism with thyroidectomy in 2018 . Referred by Dr Woody Seller for chest pain She had esophageal stricture dilated by Dr Gala Romney January 2020 for Schatzki ring She was seen by GI 07/26/19 with complaints of globus sensation and recurrent solid food dysphagia Manometry showed normal relaxation and she is continued on bid PPI She has also had a normal gastric emptying study on 07/31/19  Also with continued nausea and takes promethazine qhs on Augst 26 th visit complained of intermittent SSCP Occurs at rest and not exertion   She works as a Educational psychologist at [employer] She has 4 kids at home ages 4-15 She has had issues since 2016 with right leg pian after birth of her last child Had spinal  Tap and 3 patches with persistent pain and headaches that she has seen Dr Lavell Anchors in radiology for   ROS All other systems reviewed and negative except as noted above  Past Medical History:  Diagnosis Date  . Anxiety   . Arthritis    lower back, right leg  . Back pain   . Cerebrospinal fluid leak from spinal puncture 03/03/2016   after epidural steroid injection; states has had 2 blood patch procedures   . Difficulty swallowing solids    certain foods  . Fibromyalgia   . GERD (gastroesophageal reflux disease)   . Hyperthyroidism   . Migraines   . Nasal congestion 12/06/2016  . Neck pain of over 3 months duration    posterior and right side  . Neuropathy    right leg  . Restless leg syndrome   . Thyroid nodule 11/2016   right  . Tracheal deviation    due to thyroid nodule, per pt.  . Urinary frequency     Family History  Problem Relation Age of Onset  . Cancer Maternal Grandmother        breast  . Diabetes Maternal Grandmother   . Hypertension Maternal Grandmother   . Heart disease Maternal Grandfather   . Hypertension Maternal Grandfather   . Hypertension Paternal Grandmother   . Hypertension Paternal Grandfather   . Premature birth Daughter   . Asthma Daughter   . Hypertension Mother   . Diabetes Maternal Aunt   . Hypertension Maternal Aunt   . Stroke Maternal Aunt   . Diabetes Maternal Uncle   . Hypertension Maternal Uncle   . Hypertension Paternal Aunt   . Hypertension Paternal Uncle   . Colon cancer Neg Hx   . Colon polyps Neg Hx     Social History   Socioeconomic History  . Marital status: Legally Separated    Spouse name: Khylie Schara   . Number of children: 3  . Years of education: 4 Bachelor  . Highest education level: Not on file  Occupational History  . 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 Smoking status: Never Smoker  . Smokeless tobacco: Never Used  Substance and Sexual Activity  . Alcohol use: No  . Drug use: No  . Sexual activity: Yes  Birth control/protection: Implant  Other Topics Concern  . Not on file  Social History Narrative   Patient lives with her fiance and 3 children.    Her fiance is the father of her current pregnancy, other three children were fathered by her previous partner.    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     Caffeine use: none   Right-handed   Social Determinants of Health   Financial Resource Strain:   . Difficulty of Paying Living Expenses: Not on file  Food Insecurity:   . Worried About Charity fundraiser in the Last Year: Not on file  . Ran Out of Food in the Last Year: Not on file  Transportation Needs:   . Lack of  Transportation (Medical): Not on file  . Lack of Transportation (Non-Medical): Not on file  Physical Activity:   . Days of Exercise per Week: Not on file  . Minutes of Exercise per Session: Not on file  Stress:   . Feeling of Stress : Not on file  Social Connections:   . Frequency of Communication with Friends and Family: Not on file  . Frequency of Social Gatherings with Friends and Family: Not on file  . Attends Religious Services: Not on file  . Active Member of Clubs or Organizations: Not on file  . Attends Archivist Meetings: Not on file  . Marital Status: Not on file  Intimate Partner Violence:   . Fear of Current or Ex-Partner: Not on file  . Emotionally Abused: Not on file  . Physically Abused: Not on file  . Sexually Abused: Not on file    Past Surgical History:  Procedure Laterality Date  . DG MYLEOGRAM LUMBAR SPINE ([place] [facility])    . DILATION AND CURETTAGE OF UTERUS  06/25/2002  . ESOPHAGEAL MANOMETRY N/A 05/09/2019   normal relaxation of the EG junction, no significant esophageal peristaltic abnormality detected on study  . ESOPHAGOGASTRODUODENOSCOPY (EGD) WITH PROPOFOL N/A 08/28/2018   mild Schatzki ring s/p dilation, normal stomach and duodenum  . MALONEY DILATION N/A 08/28/2018   Procedure: [name] DILATION;  Surgeon: Daneil Dolin, MD;  [facility];  Service: Endoscopy;  Laterality: N/A;  . THYROIDECTOMY Right 12/13/2016   Procedure: RIGHT HEMI-THYROIDECTOMY;  Surgeon: Leta Baptist, MD;  Location: [facility];  Service: ENT;  Laterality: Right;  . TONSILLECTOMY       . triamcinolone acetonide  10 mg Other Once     Physical Exam: Blood pressure 115/79, pulse 72, temperature (!) 97.3 F (36.3 C), temperature source Temporal, height 5\' 9"  (1.753 m), weight 183 lb (83 kg).    Affect appropriate Healthy:  appears stated age 25: post thyroidectomy  Neck supple with no adenopathy JVP normal no bruits no thyromegaly Lungs clear with  no wheezing and good diaphragmatic motion Heart:  S1/S2 no murmur, no rub, gallop or click PMI normal Abdomen: benighn, BS positve, no tenderness, no AAA no bruit.  No HSM or HJR Distal pulses intact with no bruits No edema Neuro non-focal Skin warm and dry No muscular weakness   Labs:   Lab Results  Component Value Date   WBC 5.8 03/23/2019   HGB 12.7 03/23/2019   HCT 40.1 03/23/2019   MCV 97.8 03/23/2019   PLT 242 03/23/2019    Radiology: NM Gastric Emptying  Result Date: 07/31/2019 CLINICAL DATA:  Intermittent  abdominal pain, nausea and vomiting, and early satiety for approximately 1 year. EXAM: NUCLEAR MEDICINE GASTRIC EMPTYING SCAN TECHNIQUE: After oral ingestion of radiolabeled meal, sequential abdominal images were obtained for 4 hours. Percentage of activity emptying the stomach was calculated at 1 hour, 2 hour, 3 hour, and 4 hours. RADIOPHARMACEUTICALS:  2 mCi Tc-5m sulfur colloid in standardized meal COMPARISON:  None. FINDINGS: Expected location of the stomach in the left upper quadrant. Ingested meal empties the stomach gradually over the course of the study. 34% emptied at 1 hr ( normal >= 10%) 67% emptied at 2 hr ( normal >= 40%) 86% emptied at 3 hr ( normal >= 70%) 91% emptied at 4 hr ( normal >= 90%) IMPRESSION: Normal gastric emptying study. Electronically Signed   By: Marlaine Hind M.D.   On: 07/31/2019 15:04    EKG: NSR rate 75 normal 03/23/19    ASSESSMENT AND PLAN:   1. Chest Pain: atypical normal ECG likely related to chronic GI issues Will order f/u ETT  She knows she will Need a COVID test prior to but I don't want to do any testing that involves radiation given low likelihood of CAD 2. Thyroid post thyroidectomy needs updated TSH/T3/T4 last one in chart a year ago TSH normal 0.87 3. GI:  Chronic issues post dilatation and persistent globus sensation f/u Rourk continue dexilant, and phergan 4. Anxiety/Depression: continue Cymbalta mood seems appropriate   5. Pain:  Right leg and headaches multiple previous patches post spinal tap F/U with neurology Dr Lavell Anchors   Signed: Jenkins Rouge 08/27/2019, 3:21 PM

## 2019-08-27 ENCOUNTER — Encounter: Payer: Self-pay | Admitting: Cardiovascular Disease

## 2019-08-27 ENCOUNTER — Other Ambulatory Visit: Payer: Self-pay

## 2019-08-27 ENCOUNTER — Ambulatory Visit (INDEPENDENT_AMBULATORY_CARE_PROVIDER_SITE_OTHER): Payer: BC Managed Care – PPO | Admitting: Cardiovascular Disease

## 2019-08-27 VITALS — BP 115/79 | HR 72 | Temp 97.3°F | Ht 69.0 in | Wt 183.0 lb

## 2019-08-27 DIAGNOSIS — R079 Chest pain, unspecified: Secondary | ICD-10-CM | POA: Diagnosis not present

## 2019-08-27 NOTE — Patient Instructions (Signed)
Medication Instructions:  °Your physician recommends that you continue on your current medications as directed. Please refer to the Current Medication list given to you today. ° °Labwork: °none ° °Testing/Procedures: °Your physician has requested that you have an exercise tolerance test. For further information please visit [email or web address]. Please also follow instruction sheet, as given. ° °Follow-Up: °Your physician recommends that you schedule a follow-up appointment in: as needed ° °Any Other Special Instructions Will Be Listed Below (If Applicable). ° °If you need a refill on your cardiac medications before your next appointment, please call your pharmacy. °

## 2019-08-30 ENCOUNTER — Other Ambulatory Visit (HOSPITAL_COMMUNITY)
Admission: RE | Admit: 2019-08-30 | Discharge: 2019-08-30 | Disposition: A | Discharge status: Home / Self Care | Payer: BC Managed Care – PPO | Source: Ambulatory Visit | Attending: Cardiovascular Disease | Admitting: Cardiovascular Disease

## 2019-08-30 ENCOUNTER — Other Ambulatory Visit: Payer: Self-pay

## 2019-08-30 ENCOUNTER — Other Ambulatory Visit (HOSPITAL_COMMUNITY): Payer: BC Managed Care – PPO

## 2019-08-30 DIAGNOSIS — Z20822 Contact with and (suspected) exposure to covid-19: Secondary | ICD-10-CM | POA: Insufficient documentation

## 2019-08-30 DIAGNOSIS — Z01812 Encounter for preprocedural laboratory examination: Secondary | ICD-10-CM | POA: Diagnosis not present

## 2019-08-30 LAB — SARS CORONAVIRUS 2 (TAT 6-24 HRS): SARS Coronavirus 2: NEGATIVE

## 2019-09-03 ENCOUNTER — Telehealth: Payer: Self-pay | Admitting: Cardiovascular Disease

## 2019-09-03 ENCOUNTER — Encounter (HOSPITAL_COMMUNITY): Payer: BC Managed Care – PPO

## 2019-09-03 NOTE — Telephone Encounter (Signed)
Pt had to r/s GXT-- will she need another COVID test?   GXT r/s for 1/18

## 2019-09-04 NOTE — Telephone Encounter (Signed)
MUST have Covid test within a 4 day window

## 2019-09-04 NOTE — Telephone Encounter (Signed)
Covid scheduled for 1/15, gxt on 1/18, pt made aware

## 2019-09-06 ENCOUNTER — Ambulatory Visit: Payer: Medicaid Other | Admitting: "Endocrinology

## 2019-09-07 ENCOUNTER — Other Ambulatory Visit: Payer: Self-pay

## 2019-09-07 ENCOUNTER — Other Ambulatory Visit (HOSPITAL_COMMUNITY)
Admission: RE | Admit: 2019-09-07 | Discharge: 2019-09-07 | Disposition: A | Discharge status: Home / Self Care | Payer: BC Managed Care – PPO | Source: Ambulatory Visit | Attending: Cardiovascular Disease | Admitting: Cardiovascular Disease

## 2019-09-07 DIAGNOSIS — Z01812 Encounter for preprocedural laboratory examination: Secondary | ICD-10-CM | POA: Diagnosis not present

## 2019-09-07 DIAGNOSIS — Z20822 Contact with and (suspected) exposure to covid-19: Secondary | ICD-10-CM | POA: Insufficient documentation

## 2019-09-07 LAB — SARS CORONAVIRUS 2 (TAT 6-24 HRS): SARS Coronavirus 2: NEGATIVE

## 2019-09-10 ENCOUNTER — Ambulatory Visit (HOSPITAL_COMMUNITY)
Admission: RE | Admit: 2019-09-10 | Discharge: 2019-09-10 | Disposition: A | Discharge status: Home / Self Care | Payer: BC Managed Care – PPO | Source: Ambulatory Visit | Attending: Cardiovascular Disease | Admitting: Cardiovascular Disease

## 2019-09-10 ENCOUNTER — Other Ambulatory Visit: Payer: Self-pay

## 2019-09-10 DIAGNOSIS — R079 Chest pain, unspecified: Secondary | ICD-10-CM

## 2019-09-10 LAB — EXERCISE TOLERANCE TEST
Estimated workload: 10.1 METS
Exercise duration (min): 8 min
Exercise duration (sec): 58 s
MPHR: 185 {beats}/min
Peak HR: 179 {beats}/min
Percent HR: 96 %
RPE: 13
Rest HR: 73 {beats}/min

## 2019-10-24 NOTE — Progress Notes (Signed)
Referring Provider: Glenda Chroman, MD Primary Care Physician:  Glenda Chroman, MD Primary GI: Dr. Gala Romney   Chief Complaint  Patient presents with  . Nausea    doing okay  . globus sensation    unchanged  . upper abd pain    comes/goes    HPI:   Linda Reid is a 34 y.o. female presenting today with a history of globus sensation, solid food dysphagia, abdominal pain. EGD on file from early last year with Schatzki's ring s/p dilation. History of thyroid nodule and right thyroidectomy, with CT neck and thyroid ultrasound on file without concerning findings  Dysphagia, globus sensation: completed manometry. She had normal relaxation of the EG junction, no significant esophageal peristaltic abnormality detected on study. PPI BID controls reflux. Still has vague globus sensation at times. Feels like she has more solid food dysphagia now. Noted improvement with dilation in past.   Nausea: GES normal. Nausea not as often. Not as bad. Not every night. Pepcid as needed in evenings. Phenergan before bed but 2-3 times per week.   Abdominal pain: resolved in the past with Benefiber and addressing constipation. Gallbladder present. Pain will be in LUQ and extend down left side. Pain will happen every few months but when it happens, will last for about 3 days. Waxes and wanes during those 3 days. Nothing triggers pain. BM at least every other day, feels productive, no straining. Still taking Benefiber. Started taking probiotic vitamin as well. Taking for past 5-6 months. Nothing relieves pain. No fever or chills. Last episode a few weeks ago. No NSAIDs.   Past Medical History:  Diagnosis Date  . Anxiety   . Arthritis    lower back, right leg  . Back pain   . Cerebrospinal fluid leak from spinal puncture 03/03/2016   after epidural steroid injection; states has had 2 blood patch procedures   . Difficulty swallowing solids    certain foods  . Fibromyalgia   . GERD  (gastroesophageal reflux disease)   . Hyperthyroidism   . Migraines   . Nasal congestion 12/06/2016  . Neck pain of over 3 months duration    posterior and right side  . Neuropathy    right leg  . Restless leg syndrome   . Thyroid nodule 11/2016   right  . Tracheal deviation    due to thyroid nodule, per pt.  . Urinary frequency     Past Surgical History:  Procedure Laterality Date  . DG MYLEOGRAM LUMBAR SPINE (Sunol HX)    . DILATION AND CURETTAGE OF UTERUS  06/25/2002  . ESOPHAGEAL MANOMETRY N/A 05/09/2019   normal relaxation of the EG junction, no significant esophageal peristaltic abnormality detected on study  . ESOPHAGOGASTRODUODENOSCOPY (EGD) WITH PROPOFOL N/A 08/28/2018   mild Schatzki ring s/p dilation, normal stomach and duodenum  . MALONEY DILATION N/A 08/28/2018   Procedure: [name] DILATION;  Surgeon: Daneil Dolin, MD;  [facility];  Service: Endoscopy;  Laterality: N/A;  . THYROIDECTOMY Right 12/13/2016   Procedure: RIGHT HEMI-THYROIDECTOMY;  Surgeon: Leta Baptist, MD;  [facility];  Service: ENT;  Laterality: Right;  . TONSILLECTOMY      Current Outpatient Medications  Medication Sig Dispense Refill  . Cholecalciferol (VITAMIN D3) 5000 units CAPS Take 1 capsule (5,000 Units total) by mouth daily. 90 capsule 0  . cyclobenzaprine (FLEXERIL) 5 MG tablet Take 1 tablet (5 mg total) by mouth 3 (three) times daily as  needed. 30 tablet 0  . DULoxetine (CYMBALTA) 30 MG capsule Take 1 capsule (30 mg total) by mouth at bedtime. 30 capsule 1  . gabapentin (NEURONTIN) 300 MG capsule Take 300 mg by mouth 2 (two) times daily.    . norethindrone (MICRONOR) 0.35 MG tablet Take 1 tablet (0.35 mg total) by mouth daily. 1 Package 11  . pantoprazole (PROTONIX) 40 MG tablet Take 1 tablet (40 mg total) by mouth 2 (two) times daily before a meal. 180 tablet 3  . promethazine (PHENERGAN) 25 MG tablet Take 0.5 tablets (12.5 mg total) by mouth every 6 (six) hours  as needed for nausea or vomiting. (Patient taking differently: Take 25 mg by mouth every 6 (six) hours as needed for nausea or vomiting. ) 30 tablet 0   Current Facility-Administered Medications  Medication Dose Route Frequency Provider Last Rate Last Admin  . triamcinolone acetonide (KENALOG) 10 MG/ML injection 10 mg  10 mg Other Once Landis Martins, DPM        Allergies as of 10/25/2019  . (No Known Allergies)    Family History  Problem Relation Age of Onset  . Cancer Maternal Grandmother        breast  . Diabetes Maternal Grandmother   . Hypertension Maternal Grandmother   . Heart disease Maternal Grandfather   . Hypertension Maternal Grandfather   . Hypertension Paternal Grandmother   . Hypertension Paternal Grandfather   . Premature birth Daughter   . Asthma Daughter   . Hypertension Mother   . Diabetes Maternal Aunt   . Hypertension Maternal Aunt   . Stroke Maternal Aunt   . Diabetes Maternal Uncle   . Hypertension Maternal Uncle   . Hypertension Paternal Aunt   . Hypertension Paternal Uncle   . Colon cancer Neg Hx   . Colon polyps Neg Hx     Social History   Socioeconomic History  . Marital status: Legally Separated    Spouse name: Arthea Lain   . Number of children: 3  . Years of education: 4 Bachelor  . Highest education level: Not on file  Occupational History  . 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 Smoking status: Never Smoker  . Smokeless tobacco: Never Used  Substance and Sexual Activity  . Alcohol use: No  . Drug use: No  . Sexual activity: Yes    Birth control/protection: Implant  Other Topics Concern  . Not on file  Social History Narrative   Patient lives with her fiance and 3 children.    Her fiance is the father of her current pregnancy, other three children were fathered by her previous partner.    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     Caffeine use: none   Right-handed   Social Determinants of Health   Financial Resource Strain:   . Difficulty of Paying Living Expenses: Not on file  Food Insecurity:   . Worried About Charity fundraiser in the Last Year: Not on file  . Ran Out of Food in the Last Year: Not on file  Transportation Needs:   . Lack of Transportation (Medical): Not on file  . Lack of Transportation (Non-Medical): Not on file  Physical Activity:   . Days of Exercise per Week: Not on file  . Minutes of  Exercise per Session: Not on file  Stress:   . Feeling of Stress : Not on file  Social Connections:   . Frequency of Communication with Friends and Family: Not on file  . Frequency of Social Gatherings with Friends and Family: Not on file  . Attends Religious Services: Not on file  . Active Member of Clubs or Organizations: Not on file  . Attends Archivist Meetings: Not on file  . Marital Status: Not on file    Review of Systems: Gen: Denies fever, chills, anorexia. Denies fatigue, weakness, weight loss.  CV: Denies chest pain, palpitations, syncope, peripheral edema, and claudication. Resp: Denies dyspnea at rest, cough, wheezing, coughing up blood, and pleurisy. GI: see HPI Derm: Denies rash, itching, dry skin Psych: Denies depression, anxiety, memory loss, confusion. No homicidal or suicidal ideation.  Heme: Denies bruising, bleeding, and enlarged lymph nodes.  Physical Exam: BP 115/74   Pulse 76   Temp (!) 97.3 F (36.3 C) (Oral)   Ht 5\' 9"  (1.753 m)   Wt 186 lb 6.4 oz (84.6 kg)   LMP 08/27/2019   BMI 27.53 kg/m  General:   Alert and oriented. No distress noted. Pleasant and cooperative.  Head:  Normocephalic and atraumatic. Eyes:  Conjuctiva clear without scleral icterus. Lungs: clear bilaterally Cardiac: S1 S2 present without murmurs Abdomen:  +BS, soft, non-tender and  non-distended. No rebound or guarding. No HSM or masses noted. Msk:  Symmetrical without gross deformities. Normal posture. Extremities:  Without edema. Neurologic:  Alert and  oriented x4 Psych:  Alert and cooperative. Normal mood and affect.  ASSESSMENT: Linda Reid is a 35 y.o. female presenting today with history of chronic GERD, globus sensation, abdominal pain, returning in follow-up with recurrent dysphagia and intermittent LUQ/LLQ pain.   GERD: well controlled on Protonix BID and Pepcid prn. Phenergan prn nausea. Prior GES normal.  Globus sensation: chronic, with manometry on file and normal. Now with recurrent solid dysphagia. Last EGD Jan 2020 with improvement s/p dilation. Will pursue EGD/dilation in near future.   LUQ/LLQ abdominal pain: infrequent but when occurring lasting several days. Previously abdominal pain responded well to maximized bowel regimen. I have asked her to increase Benefiber to BID. Call if recurrent abdomina pain and will pursue CT at that time. Suspect underlying constipation playing a role.    PLAN:   Proceed with upper endoscopy/dilation in the near future with Dr. Gala Romney. The risks, benefits, and alternatives have been discussed in detail with patient. They have stated understanding and desire to proceed. Propofol due to polypharmacy  Continue Benefiber but increase to BID  PPI BID, Pepcid prn  Call if recurrent abdominal pain in interim  Follow-up after EGD/dilation  Annitta Needs, PhD, ANP-BC [facility]

## 2019-10-25 ENCOUNTER — Ambulatory Visit (INDEPENDENT_AMBULATORY_CARE_PROVIDER_SITE_OTHER): Payer: BC Managed Care – PPO | Admitting: Gastroenterology

## 2019-10-25 ENCOUNTER — Other Ambulatory Visit: Payer: Self-pay

## 2019-10-25 ENCOUNTER — Encounter: Payer: Self-pay | Admitting: Gastroenterology

## 2019-10-25 VITALS — BP 115/74 | HR 76 | Temp 97.3°F | Ht 69.0 in | Wt 186.4 lb

## 2019-10-25 DIAGNOSIS — R1010 Upper abdominal pain, unspecified: Secondary | ICD-10-CM | POA: Diagnosis not present

## 2019-10-25 DIAGNOSIS — K219 Gastro-esophageal reflux disease without esophagitis: Secondary | ICD-10-CM

## 2019-10-25 DIAGNOSIS — R1310 Dysphagia, unspecified: Secondary | ICD-10-CM | POA: Diagnosis not present

## 2019-10-25 NOTE — Patient Instructions (Addendum)
Let's increase the Benefiber to twice a day.   Please call me with recurrent abdominal pain, and we will do imaging.  We are arranging an upper endoscopy with dilation in the near future!  We will see you in 6 months!   I enjoyed seeing you again today! As you know, I value our relationship and want to provide genuine, compassionate, and quality care. I welcome your feedback. If you receive a survey regarding your visit,  I greatly appreciate you taking time to fill this out. See you next time!  Annitta Needs, PhD, ANP-BC [facility]

## 2019-10-31 ENCOUNTER — Other Ambulatory Visit: Payer: Self-pay

## 2019-10-31 ENCOUNTER — Telehealth: Payer: Self-pay

## 2019-10-31 NOTE — Telephone Encounter (Signed)
Called [employer], spoke to [place], for [place] of EGD/DIL. Case approved. Order# [id number], valid 10/31/19-12/29/19

## 2019-12-18 NOTE — Patient Instructions (Signed)
Linda Reid  12/18/2019     @PREFPERIOPPHARMACY @   Your procedure is scheduled on  12/24/2019 .  Report to [facility] at  1330 (1:30)  P.M.  Call this number if you have problems the morning of surgery:  [phone]   Remember:  Follow the diet instructions given to you by Dr Roseanne Kaufman office.                  Take these medicines the morning of surgery with A SIP OF WATER zyrtec, flexeril, cymbalta, neurontin, protonix, phenergan (if needed).    Do not wear jewelry, make-up or nail polish.  Do not wear lotions, powders, or perfumes. Please wear deodorant and brush your teeth.  Do not shave 48 hours prior to surgery.  Men may shave face and neck.  Do not bring valuables to the hospital.  [facility] is not responsible for any belongings or valuables.  Contacts, dentures or bridgework may not be worn into surgery.  Leave your suitcase in the car.  After surgery it may be brought to your room.  For patients admitted to the hospital, discharge time will be determined by your treatment team.  Patients discharged the day of surgery will not be allowed to drive home.   Name and phone number of your driver:   family Special instructions:  DO NOT smoke the day of your procedure.  Please read over the following fact sheets that you were given. Anesthesia Post-op Instructions and Care and Recovery After Surgery       Upper Endoscopy, Adult, Care After This sheet gives you information about how to care for yourself after your procedure. Your health care provider may also give you more specific instructions. If you have problems or questions, contact your health care provider. What can I expect after the procedure? After the procedure, it is common to have:  A sore throat.  Mild stomach pain or discomfort.  Bloating.  Nausea. Follow these instructions at home:   Follow instructions from your health care provider about what to eat or drink after your  procedure.  Return to your normal activities as told by your health care provider. Ask your health care provider what activities are safe for you.  Take over-the-counter and prescription medicines only as told by your health care provider.  Do not drive for 24 hours if you were given a sedative during your procedure.  Keep all follow-up visits as told by your health care provider. This is important. Contact a health care provider if you have:  A sore throat that lasts longer than one day.  Trouble swallowing. Get help right away if:  You vomit blood or your vomit looks like coffee grounds.  You have: ? A fever. ? Bloody, black, or tarry stools. ? A severe sore throat or you cannot swallow. ? Difficulty breathing. ? Severe pain in your chest or abdomen. Summary  After the procedure, it is common to have a sore throat, mild stomach discomfort, bloating, and nausea.  Do not drive for 24 hours if you were given a sedative during the procedure.  Follow instructions from your health care provider about what to eat or drink after your procedure.  Return to your normal activities as told by your health care provider. This information is not intended to replace advice given to you by your health care provider. Make sure you discuss any questions you have with your health care provider.  Document Revised: 01/31/2018 Document Reviewed: 01/09/2018 Elsevier Patient Education  [place].  Esophageal Dilatation Esophageal dilatation, also called esophageal dilation, is a procedure to widen or open (dilate) a blocked or narrowed part of the esophagus. The esophagus is the part of the body that moves food and liquid from the mouth to the stomach. You may need this procedure if:  You have a buildup of scar tissue in your esophagus that makes it difficult, painful, or impossible to swallow. This can be caused by gastroesophageal reflux disease (GERD).  You have cancer of the esophagus.   There is a problem with how food moves through your esophagus. In some cases, you may need this procedure repeated at a later time to dilate the esophagus gradually. Tell a health care provider about:  Any allergies you have.  All medicines you are taking, including vitamins, herbs, eye drops, creams, and over-the-counter medicines.  Any problems you or family members have had with anesthetic medicines.  Any blood disorders you have.  Any surgeries you have had.  Any medical conditions you have.  Any antibiotic medicines you are required to take before dental procedures.  Whether you are pregnant or may be pregnant. What are the risks? Generally, this is a safe procedure. However, problems may occur, including:  Bleeding due to a tear in the lining of the esophagus.  A hole (perforation) in the esophagus. What happens before the procedure?  Follow instructions from your health care provider about eating or drinking restrictions.  Ask your health care provider about changing or stopping your regular medicines. This is especially important if you are taking diabetes medicines or blood thinners.  Plan to have someone take you home from the hospital or clinic.  Plan to have a responsible adult care for you for at least 24 hours after you leave the hospital or clinic. This is important. What happens during the procedure?  You may be given a medicine to help you relax (sedative).  A numbing medicine may be sprayed into the back of your throat, or you may gargle the medicine.  Your health care provider may perform the dilatation using various surgical instruments, such as: ? Simple dilators. This instrument is carefully placed in the esophagus to stretch it. ? Guided wire bougies. This involves using an endoscope to insert a wire into the esophagus. A dilator is passed over this wire to enlarge the esophagus. Then the wire is removed. ? Balloon dilators. An endoscope with a  small balloon at the end is inserted into the esophagus. The balloon is inflated to stretch the esophagus and open it up. The procedure may vary among health care providers and hospitals. What happens after the procedure?  Your blood pressure, heart rate, breathing rate, and blood oxygen level will be monitored until the medicines you were given have worn off.  Your throat may feel slightly sore and numb. This will improve slowly over time.  You will not be allowed to eat or drink until your throat is no longer numb.  When you are able to drink, urinate, and sit on the edge of the bed without nausea or dizziness, you may be able to return home. Follow these instructions at home:  Take over-the-counter and prescription medicines only as told by your health care provider.  Do not drive for 24 hours if you were given a sedative during your procedure.  You should have a responsible adult with you for 24 hours after the procedure.  Follow  instructions from your health care provider about any eating or drinking restrictions.  Do not use any products that contain nicotine or tobacco, such as cigarettes and e-cigarettes. If you need help quitting, ask your health care provider.  Keep all follow-up visits as told by your health care provider. This is important. Get help right away if you:  Have a fever.  Have chest pain.  Have pain that is not relieved by medication.  Have trouble breathing.  Have trouble swallowing.  Vomit blood. Summary  Esophageal dilatation, also called esophageal dilation, is a procedure to widen or open (dilate) a blocked or narrowed part of the esophagus.  Plan to have someone take you home from the hospital or clinic.  For this procedure, a numbing medicine may be sprayed into the back of your throat, or you may gargle the medicine.  Do not drive for 24 hours if you were given a sedative during your procedure. This information is not intended to replace  advice given to you by your health care provider. Make sure you discuss any questions you have with your health care provider. Document Revised: 06/06/2019 Document Reviewed: 06/14/2017 Elsevier Patient Education  2020 [place] After These instructions provide you with information about caring for yourself after your procedure. Your health care provider may also give you more specific instructions. Your treatment has been planned according to current medical practices, but problems sometimes occur. Call your health care provider if you have any problems or questions after your procedure. What can I expect after the procedure? After your procedure, you may:  Feel sleepy for several hours.  Feel clumsy and have poor balance for several hours.  Feel forgetful about what happened after the procedure.  Have poor judgment for several hours.  Feel nauseous or vomit.  Have a sore throat if you had a breathing tube during the procedure. Follow these instructions at home: For at least 24 hours after the procedure:      Have a responsible adult stay with you. It is important to have someone help care for you until you are awake and alert.  Rest as needed.  Do not: ? Participate in activities in which you could fall or become injured. ? Drive. ? Use heavy machinery. ? Drink alcohol. ? Take sleeping pills or medicines that cause drowsiness. ? Make important decisions or sign legal documents. ? Take care of children on your own. Eating and drinking  Follow the diet that is recommended by your health care provider.  If you vomit, drink water, juice, or soup when you can drink without vomiting.  Make sure you have little or no nausea before eating solid foods. General instructions  Take over-the-counter and prescription medicines only as told by your health care provider.  If you have sleep apnea, surgery and certain medicines can increase your  risk for breathing problems. Follow instructions from your health care provider about wearing your sleep device: ? Anytime you are sleeping, including during daytime naps. ? While taking prescription pain medicines, sleeping medicines, or medicines that make you drowsy.  If you smoke, do not smoke without supervision.  Keep all follow-up visits as told by your health care provider. This is important. Contact a health care provider if:  You keep feeling nauseous or you keep vomiting.  You feel light-headed.  You develop a rash.  You have a fever. Get help right away if:  You have trouble breathing. Summary  For several hours  after your procedure, you may feel sleepy and have poor judgment.  Have a responsible adult stay with you for at least 24 hours or until you are awake and alert. This information is not intended to replace advice given to you by your health care provider. Make sure you discuss any questions you have with your health care provider. Document Revised: 11/07/2017 Document Reviewed: 11/30/2015 Elsevier Patient Education  [place].

## 2019-12-19 ENCOUNTER — Other Ambulatory Visit: Payer: Self-pay

## 2019-12-19 ENCOUNTER — Encounter (HOSPITAL_COMMUNITY)
Admission: RE | Admit: 2019-12-19 | Discharge: 2019-12-19 | Disposition: A | Discharge status: Home / Self Care | Payer: BC Managed Care – PPO | Source: Ambulatory Visit | Attending: Internal Medicine | Admitting: Internal Medicine

## 2019-12-19 DIAGNOSIS — Z01812 Encounter for preprocedural laboratory examination: Secondary | ICD-10-CM | POA: Diagnosis not present

## 2019-12-19 LAB — HCG, SERUM, QUALITATIVE: Preg, Serum: NEGATIVE

## 2019-12-20 ENCOUNTER — Other Ambulatory Visit (HOSPITAL_COMMUNITY): Payer: BC Managed Care – PPO

## 2019-12-20 ENCOUNTER — Encounter (HOSPITAL_COMMUNITY): Admission: RE | Admit: 2019-12-20 | Payer: BC Managed Care – PPO | Source: Ambulatory Visit

## 2019-12-21 ENCOUNTER — Other Ambulatory Visit (HOSPITAL_COMMUNITY)
Admission: RE | Admit: 2019-12-21 | Discharge: 2019-12-21 | Disposition: A | Discharge status: Home / Self Care | Payer: BC Managed Care – PPO | Source: Ambulatory Visit | Attending: Internal Medicine | Admitting: Internal Medicine

## 2019-12-21 ENCOUNTER — Other Ambulatory Visit: Payer: Self-pay

## 2019-12-21 DIAGNOSIS — Z20822 Contact with and (suspected) exposure to covid-19: Secondary | ICD-10-CM | POA: Insufficient documentation

## 2019-12-21 DIAGNOSIS — Z01812 Encounter for preprocedural laboratory examination: Secondary | ICD-10-CM | POA: Diagnosis not present

## 2019-12-22 LAB — SARS CORONAVIRUS 2 (TAT 6-24 HRS): SARS Coronavirus 2: NEGATIVE

## 2019-12-24 ENCOUNTER — Ambulatory Visit (HOSPITAL_COMMUNITY): Payer: BC Managed Care – PPO | Admitting: Anesthesiology

## 2019-12-24 ENCOUNTER — Ambulatory Visit (HOSPITAL_COMMUNITY)
Admission: RE | Admit: 2019-12-24 | Discharge: 2019-12-24 | Disposition: A | Discharge status: Home / Self Care | Payer: BC Managed Care – PPO | Attending: Internal Medicine | Admitting: Internal Medicine

## 2019-12-24 ENCOUNTER — Encounter (HOSPITAL_COMMUNITY)
Admission: RE | Disposition: A | Discharge status: Home / Self Care | Payer: Self-pay | Source: Home / Self Care | Attending: Internal Medicine

## 2019-12-24 ENCOUNTER — Encounter (HOSPITAL_COMMUNITY): Payer: Self-pay | Admitting: Internal Medicine

## 2019-12-24 DIAGNOSIS — K219 Gastro-esophageal reflux disease without esophagitis: Secondary | ICD-10-CM | POA: Diagnosis not present

## 2019-12-24 DIAGNOSIS — Z79899 Other long term (current) drug therapy: Secondary | ICD-10-CM | POA: Diagnosis not present

## 2019-12-24 DIAGNOSIS — F419 Anxiety disorder, unspecified: Secondary | ICD-10-CM | POA: Insufficient documentation

## 2019-12-24 DIAGNOSIS — G2581 Restless legs syndrome: Secondary | ICD-10-CM | POA: Insufficient documentation

## 2019-12-24 DIAGNOSIS — Z8249 Family history of ischemic heart disease and other diseases of the circulatory system: Secondary | ICD-10-CM | POA: Diagnosis not present

## 2019-12-24 DIAGNOSIS — M1990 Unspecified osteoarthritis, unspecified site: Secondary | ICD-10-CM | POA: Insufficient documentation

## 2019-12-24 DIAGNOSIS — R1310 Dysphagia, unspecified: Secondary | ICD-10-CM | POA: Diagnosis not present

## 2019-12-24 DIAGNOSIS — E0590 Thyrotoxicosis, unspecified without thyrotoxic crisis or storm: Secondary | ICD-10-CM | POA: Insufficient documentation

## 2019-12-24 DIAGNOSIS — M797 Fibromyalgia: Secondary | ICD-10-CM | POA: Diagnosis not present

## 2019-12-24 HISTORY — PX: ESOPHAGOGASTRODUODENOSCOPY (EGD) WITH PROPOFOL: SHX5813

## 2019-12-24 HISTORY — PX: MALONEY DILATION: SHX5535

## 2019-12-24 SURGERY — ESOPHAGOGASTRODUODENOSCOPY (EGD) WITH PROPOFOL
Anesthesia: General

## 2019-12-24 MED ORDER — KETAMINE HCL 10 MG/ML IJ SOLN
INTRAMUSCULAR | Status: DC | PRN
  Administered 2019-12-24: 20 mg via INTRAVENOUS
  Administered 2019-12-24 (×2): 10 mg via INTRAVENOUS

## 2019-12-24 MED ORDER — LIDOCAINE VISCOUS HCL 2 % MT SOLN
15.0000 mL | Freq: Once | OROMUCOSAL | Status: AC
  Administered 2019-12-24: 15 mL via OROMUCOSAL

## 2019-12-24 MED ORDER — GLYCOPYRROLATE 0.2 MG/ML IJ SOLN
0.2000 mg | Freq: Once | INTRAMUSCULAR | Status: AC
  Administered 2019-12-24: 0.2 mg via INTRAVENOUS

## 2019-12-24 MED ORDER — LIDOCAINE VISCOUS HCL 2 % MT SOLN
OROMUCOSAL | Status: AC
  Filled 2019-12-24: qty 15

## 2019-12-24 MED ORDER — KETAMINE HCL 50 MG/5ML IJ SOSY
PREFILLED_SYRINGE | INTRAMUSCULAR | Status: AC
  Filled 2019-12-24: qty 5

## 2019-12-24 MED ORDER — PROPOFOL 10 MG/ML IV BOLUS
INTRAVENOUS | Status: DC | PRN
  Administered 2019-12-24 (×5): 20 mg via INTRAVENOUS
  Administered 2019-12-24: 50 mg via INTRAVENOUS
  Administered 2019-12-24 (×3): 20 mg via INTRAVENOUS
  Administered 2019-12-24: 50 mg via INTRAVENOUS

## 2019-12-24 MED ORDER — LIDOCAINE 2% (20 MG/ML) 5 ML SYRINGE
INTRAMUSCULAR | Status: DC | PRN
  Administered 2019-12-24: 50 mg via INTRAVENOUS

## 2019-12-24 MED ORDER — GLYCOPYRROLATE 0.2 MG/ML IJ SOLN
INTRAMUSCULAR | Status: AC
  Filled 2019-12-24: qty 1

## 2019-12-24 MED ORDER — LACTATED RINGERS IV SOLN: INTRAVENOUS | Status: DC

## 2019-12-24 NOTE — Anesthesia Postprocedure Evaluation (Signed)
Anesthesia Post Note  Patient: Linda Reid  Procedure(s) Performed: ESOPHAGOGASTRODUODENOSCOPY (EGD) WITH PROPOFOL (N/A ) MALONEY DILATION (N/A )  Patient location during evaluation: PACU Anesthesia Type: General Level of consciousness: awake and alert Pain management: pain level controlled Vital Signs Assessment: post-procedure vital signs reviewed and stable Respiratory status: spontaneous breathing, nonlabored ventilation, respiratory function stable and patient connected to nasal cannula oxygen Cardiovascular status: blood pressure returned to baseline and stable Postop Assessment: no apparent nausea or vomiting Anesthetic complications: no     Last Vitals:  Vitals:   12/24/19 0900 12/24/19 1049  BP: 122/87 105/62  Pulse:  80  Resp: 14 16  Temp: 36.9 C (!) 36.4 C  SpO2: 100% 100%    Last Pain:  Vitals:   12/24/19 1049  PainSc: Asleep                 Corbin Hott

## 2019-12-24 NOTE — Transfer of Care (Signed)
Immediate Anesthesia Transfer of Care Note  Patient: Linda Reid  Procedure(s) Performed: ESOPHAGOGASTRODUODENOSCOPY (EGD) WITH PROPOFOL (N/A ) MALONEY DILATION (N/A )  Patient Location: PACU  Anesthesia Type:General  Level of Consciousness: awake, alert , oriented and patient cooperative  Airway & Oxygen Therapy: Patient Spontanous Breathing and Patient connected to nasal cannula oxygen  Post-op Assessment: Report given to RN, Post -op Vital signs reviewed and stable and Patient moving all extremities X 4  Post vital signs: Reviewed and stable  Last Vitals:  Vitals Value Taken Time  BP    Temp    Pulse 80 12/24/19 1051  Resp 17 12/24/19 1051  SpO2 100 % 12/24/19 1051  Vitals shown include unvalidated device data.  Last Pain:  Vitals:   12/24/19 0900  PainSc: 0-No pain         Complications: No apparent anesthesia complications

## 2019-12-24 NOTE — Discharge Instructions (Signed)
EGD Discharge instructions Please read the instructions outlined below and refer to this sheet in the next few weeks. These discharge instructions provide you with general information on caring for yourself after you leave the hospital. Your doctor may also give you specific instructions. While your treatment has been planned according to the most current medical practices available, unavoidable complications occasionally occur. If you have any problems or questions after discharge, please call your doctor. ACTIVITY  You may resume your regular activity but move at a slower pace for the next 24 hours.   Take frequent rest periods for the next 24 hours.   Walking will help expel (get rid of) the air and reduce the bloated feeling in your abdomen.   No driving for 24 hours (because of the anesthesia (medicine) used during the test).   You may shower.   Do not sign any important legal documents or operate any machinery for 24 hours (because of the anesthesia used during the test).  NUTRITION  Drink plenty of fluids.   You may resume your normal diet.   Begin with a light meal and progress to your normal diet.   Avoid alcoholic beverages for 24 hours or as instructed by your caregiver.  MEDICATIONS  You may resume your normal medications unless your caregiver tells you otherwise.  WHAT YOU CAN EXPECT TODAY  You may experience abdominal discomfort such as a feeling of fullness or "gas" pains.  FOLLOW-UP  Your doctor will discuss the results of your test with you.  SEEK IMMEDIATE MEDICAL ATTENTION IF ANY OF THE FOLLOWING OCCUR:  Excessive nausea (feeling sick to your stomach) and/or vomiting.   Severe abdominal pain and distention (swelling).   Trouble swallowing.   Temperature over 101 F (37.8 C).   Rectal bleeding or vomiting of blood.    Your upper endoscopy appeared normal today.  Stretched your esophagus today.  Continue Protonix 40 mg twice daily  Office visit  with [name] in 6 weeks  At patient request I called Georgina Snell at [phone]

## 2019-12-24 NOTE — Op Note (Signed)
[facility] Patient Name: Linda Reid Procedure Date: 12/24/2019 10:08 AM MRN: [id number] Date of Birth: Oct 07, 1985 Attending MD: Norvel Richards , MD CSN: [id number] Age: 34 Admit Type: Outpatient Procedure:                Upper GI endoscopy Indications:              Dysphagia Providers:                Norvel Richards, MD, Janeece Riggers, RN, Nelma Rothman, Technician Referring MD:              Medicines:                Propofol per Anesthesia Complications:            No immediate complications. Estimated Blood Loss:     Estimated blood loss was minimal. Procedure:                Pre-Anesthesia Assessment:                           - Prior to the procedure, a History and Physical                            was performed, and patient medications and                            allergies were reviewed. The patient's tolerance of                            previous anesthesia was also reviewed. The risks                            and benefits of the procedure and the sedation                            options and risks were discussed with the patient.                            All questions were answered, and informed consent                            was obtained. Prior Anticoagulants: The patient has                            taken no previous anticoagulant or antiplatelet                            agents. ASA Grade Assessment: II - A patient with                            mild systemic disease. After reviewing the risks  and benefits, the patient was deemed in                            satisfactory condition to undergo the procedure.                           After obtaining informed consent, the endoscope was                            passed under direct vision. Throughout the                            procedure, the patient's blood pressure, pulse, and                            oxygen saturations were  monitored continuously. The                            [id number]-[id number]) scope was introduced through the                            mouth, and advanced to the second part of duodenum.                            The upper GI endoscopy was accomplished without                            difficulty. The patient tolerated the procedure                            well. Scope In: 10:36:22 AM Scope Out: 10:44:26 AM Total Procedure Duration: 0 hours 8 minutes 4 seconds  Findings:      The examined esophagus was normal.      The entire examined stomach was normal.      The duodenal bulb and second portion of the duodenum were normal. The       scope was withdrawn. Dilation was performed with a Maloney dilator with       mild resistance at 56 Fr. The dilation site was examined following       endoscope reinsertion and showed no change. Estimated blood loss was       minimal. Impression:               - Normal esophagus. Dilated.                           - Normal stomach.                           - Normal duodenal bulb and second portion of the                            duodenum.                           - No specimens collected. Moderate Sedation:  Moderate (conscious) sedation was personally administered by an       anesthesia professional. The following parameters were monitored: oxygen       saturation, heart rate, blood pressure, respiratory rate, EKG, adequacy       of pulmonary ventilation, and response to care. Recommendation:           - Written discharge instructions were provided to                            the patient.                           - The signs and symptoms of potential delayed                            complications were discussed with the patient.                           - Patient has a contact number available for                            emergencies.                           - Return to normal activities tomorrow.                           -  Advance diet as tolerated.                           - Continue present medications. Continue Protonix                            40 mg twice daily                           - Return to GI office in 6 weeks. Procedure Code(s):        --- Professional ---                           [phone], Esophagogastroduodenoscopy, flexible,                            transoral; diagnostic, including collection of                            specimen(s) by brushing or washing, when performed                            (separate procedure)                           43450, Dilation of esophagus, by unguided sound or                            bougie, single or multiple passes Diagnosis Code(s):        --- Professional ---  R13.10, Dysphagia, unspecified CPT copyright 2019 American Medical Association. All rights reserved. The codes documented in this report are preliminary and upon coder review may  be revised to meet current compliance requirements. Cristopher Estimable. Tatiyana Foucher, MD Norvel Richards, MD 12/24/2019 10:57:01 AM This report has been signed electronically. Number of Addenda: 0

## 2019-12-24 NOTE — Anesthesia Preprocedure Evaluation (Signed)
Anesthesia Evaluation  Patient identified by MRN, date of birth, ID band Patient awake    Reviewed: Allergy & Precautions, H&P , NPO status , Patient's Chart, lab work & pertinent test results, reviewed documented beta blocker date and time   Airway Mallampati: II  TM Distance: >3 FB Neck ROM: full    Dental no notable dental hx.    Pulmonary neg pulmonary ROS,    Pulmonary exam normal breath sounds clear to auscultation       Cardiovascular Exercise Tolerance: Good negative cardio ROS   Rhythm:regular Rate:Normal     Neuro/Psych  Headaches,  Neuromuscular disease negative psych ROS   GI/Hepatic Neg liver ROS, GERD  Medicated,  Endo/Other  negative endocrine ROS  Renal/GU negative Renal ROS  negative genitourinary   Musculoskeletal   Abdominal   Peds  Hematology negative hematology ROS (+)   Anesthesia Other Findings   Reproductive/Obstetrics negative OB ROS                             Anesthesia Physical Anesthesia Plan  ASA: II  Anesthesia Plan: General   Post-op Pain Management:    Induction:   PONV Risk Score and Plan: 2 and Propofol infusion  Airway Management Planned:   Additional Equipment:   Intra-op Plan:   Post-operative Plan:   Informed Consent: I have reviewed the patients History and Physical, chart, labs and discussed the procedure including the risks, benefits and alternatives for the proposed anesthesia with the patient or authorized representative who has indicated his/her understanding and acceptance.     Dental Advisory Given  Plan Discussed with: CRNA  Anesthesia Plan Comments:         Anesthesia Quick Evaluation

## 2019-12-24 NOTE — H&P (Signed)
@LOGO @   Primary Care Physician:  Glenda Chroman, MD Primary Gastroenterologist:  Dr. Gala Romney  Pre-Procedure History & Physical: HPI:  Calleigh Reid is a 35 y.o. female here for here for further evaluation of dysphagia via EGD and possible esophageal dilation per history of Schatzki's ring dilated previously.  Also barium pill esophagram indicating mild impingement of the aortic arch only esophagus where the barium pill "hung" (dysphagia [place]).  Reflux symptoms well controlled on Protonix 40 mg twice daily.  Past Medical History:  Diagnosis Date  . Anxiety   . Arthritis    lower back, right leg  . Back pain   . Cerebrospinal fluid leak from spinal puncture 03/03/2016   after epidural steroid injection; states has had 2 blood patch procedures   . Difficulty swallowing solids    certain foods  . Fibromyalgia   . GERD (gastroesophageal reflux disease)   . Hyperthyroidism   . Migraines   . Nasal congestion 12/06/2016  . Neck pain of over 3 months duration    posterior and right side  . Neuropathy    right leg  . Restless leg syndrome   . Thyroid nodule 11/2016   right  . Tracheal deviation    due to thyroid nodule, per pt.  . Urinary frequency     Past Surgical History:  Procedure Laterality Date  . DG MYLEOGRAM LUMBAR SPINE ([place] HX)    . DILATION AND CURETTAGE OF UTERUS  06/25/2002  . ESOPHAGEAL MANOMETRY N/A 05/09/2019   normal relaxation of the EG junction, no significant esophageal peristaltic abnormality detected on study  . ESOPHAGOGASTRODUODENOSCOPY (EGD) WITH PROPOFOL N/A 08/28/2018   mild Schatzki ring s/p dilation, normal stomach and duodenum  . MALONEY DILATION N/A 08/28/2018   Procedure: [name] DILATION;  Surgeon: Daneil Dolin, MD;  [facility];  Service: Endoscopy;  Laterality: N/A;  . THYROIDECTOMY Right 12/13/2016   Procedure: RIGHT HEMI-THYROIDECTOMY;  Surgeon: Leta Baptist, MD;  Location: [place];  Service: ENT;   Laterality: Right;  . TONSILLECTOMY      Prior to Admission medications   Medication Sig Start Date End Date Taking? Authorizing Provider  cetirizine (ZYRTEC) 10 MG tablet Take 10 mg by mouth daily.   Yes [provider]  Cholecalciferol (VITAMIN D3) 5000 units CAPS Take 1 capsule (5,000 Units total) by mouth daily. 06/02/18  Yes Nida, Marella Chimes, MD  cyclobenzaprine (FLEXERIL) 5 MG tablet Take 1 tablet (5 mg total) by mouth 3 (three) times daily as needed. Patient taking differently: Take 5 mg by mouth 3 (three) times daily as needed for muscle spasms.  09/07/18  Yes Rolland Porter, MD  DULoxetine (CYMBALTA) 30 MG capsule Take 1 capsule (30 mg total) by mouth at bedtime. 03/16/18  Yes Jamse Arn, MD  gabapentin (NEURONTIN) 300 MG capsule Take 300 mg by mouth 2 (two) times daily.   Yes [provider]  norethindrone (MICRONOR) 0.35 MG tablet Take 1 tablet (0.35 mg total) by mouth daily. 05/22/19  Yes Roma Schanz, CNM  pantoprazole (PROTONIX) 40 MG tablet Take 1 tablet (40 mg total) by mouth 2 (two) times daily before a meal. 12/14/18  Yes Annitta Needs, NP  Probiotic Product (PROBIOTIC DAILY PO) Take 1 capsule by mouth daily.   Yes [provider]  promethazine (PHENERGAN) 25 MG tablet Take 0.5 tablets (12.5 mg total) by mouth every 6 (six) hours as needed for nausea or vomiting. Patient taking differently: Take 25 mg by  mouth at bedtime.  12/14/18  Yes Carlis Stable, NP    Allergies as of 10/25/2019  . (No Known Allergies)    Family History  Problem Relation Age of Onset  . Cancer Maternal Grandmother        breast  . Diabetes Maternal Grandmother   . Hypertension Maternal Grandmother   . Heart disease Maternal Grandfather   . Hypertension Maternal Grandfather   . Hypertension Paternal Grandmother   . Hypertension Paternal Grandfather   . Premature birth Daughter   . Asthma Daughter   . Hypertension Mother   . Diabetes Maternal Aunt   .  Hypertension Maternal Aunt   . Stroke Maternal Aunt   . Diabetes Maternal Uncle   . Hypertension Maternal Uncle   . Hypertension Paternal Aunt   . Hypertension Paternal Uncle   . Colon cancer Neg Hx   . Colon polyps Neg Hx     Social History   Socioeconomic History  . Marital status: Legally Separated    Spouse name: Sandy Joens   . Number of children: 3  . Years of education: 4 Bachelor  . Highest education level: Not on file  Occupational History  . 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 Smoking status: Never Smoker  . Smokeless tobacco: Never Used  Substance and Sexual Activity  . Alcohol use: No  . Drug use: No  . Sexual activity: Yes    Birth control/protection: Implant  Other Topics Concern  . Not on file  Social History Narrative   Patient lives with her fiance and 3 children.    Her fiance is the father of her current pregnancy, other three children were fathered by her previous partner.    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     Caffeine use: none   Right-handed   Social Determinants of Radio broadcast assistant Strain:   . Difficulty of Paying Living Expenses:   Food Insecurity:   . Worried About Charity fundraiser in the Last Year:   . Arboriculturist in the Last Year:   Transportation Needs:   . Film/video editor (Medical):   Marland Kitchen Lack of Transportation (Non-Medical):   Physical Activity:   . Days of Exercise per Week:   . Minutes of Exercise per Session:   Stress:   . Feeling of Stress :   Social Connections:   . Frequency of Communication with Friends and Family:   . Frequency of Social Gatherings with Friends and Family:   . Attends Religious Services:   . Active Member of Clubs or Organizations:   . Attends Archivist Meetings:   Marland Kitchen Marital  Status:   Intimate Partner Violence:   . Fear of Current or Ex-Partner:   . Emotionally Abused:   Marland Kitchen Physically Abused:   . Sexually Abused:     Review of Systems: See HPI, otherwise negative ROS  Physical Exam: BP 122/87 (BP Location: Right Arm)   Temp 98.5 F (36.9 C)   Resp 14   SpO2 100%  General:   Alert,  Well-developed, well-nourished, pleasant and cooperative in NAD Neck:  Supple; no masses or thyromegaly. No significant cervical adenopathy. Lungs:  Clear throughout to auscultation.   No wheezes, crackles, or rhonchi. No acute distress. Heart:  Regular rate and rhythm; no murmurs, clicks, rubs,  or gallops. Abdomen: Non-distended, normal bowel sounds.  Soft and nontender without appreciable mass or hepatosplenomegaly.  Pulses:  Normal pulses noted. Extremities:  Without clubbing or edema.  Impression/Plan: 34 year old lady here for further evaluation-management of esophageal dysphagia.  She improved with esophageal dilation previously.  Recommendations: Offer the patient a EGD with esophageal dilation as feasible/appropriate per plan.  The risks, benefits, limitations, alternatives and imponderables have been reviewed with the patient. Potential for esophageal dilation, biopsy, etc. have also been reviewed.  Questions have been answered. All parties agreeable.   Notice: This dictation was prepared with Dragon dictation along with smaller phrase technology. Any transcriptional errors that result from this process are unintentional and may not be corrected upon review.

## 2020-01-06 IMAGING — CT CT NECK W/ CM
4 of 5 series · 15 of 33 positions shown, 17 images · IV contrast (iopamidol)
Comparison: CT neck 10/04/2016

CLINICAL DATA: Difficulty swallowing. History of thyroid surgery 1
year ago.

EXAM:
CT NECK WITH CONTRAST
TECHNIQUE: Multidetector CT imaging of the neck was performed using the
standard protocol following the bolus administration of intravenous
contrast.
CONTRAST:  75mL Q21MP7-LDD IOPAMIDOL (Q21MP7-LDD) INJECTION 61%

[Series 2: axial neck (person_name) · axial · 0.48mm/px · z∈[+127,+237]mm · 3 of 111 slices shown]
[im 28/111  bone]
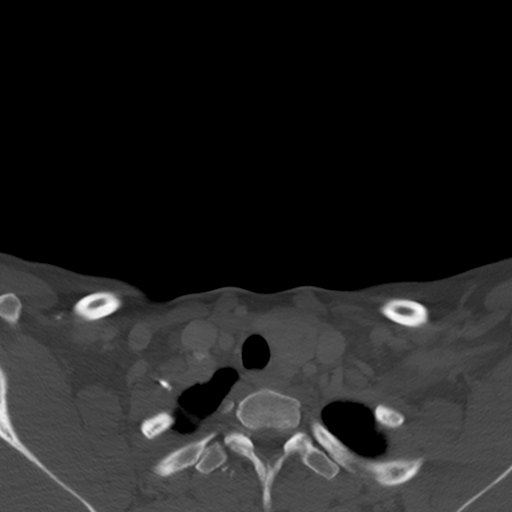
[im 56/111  bone]
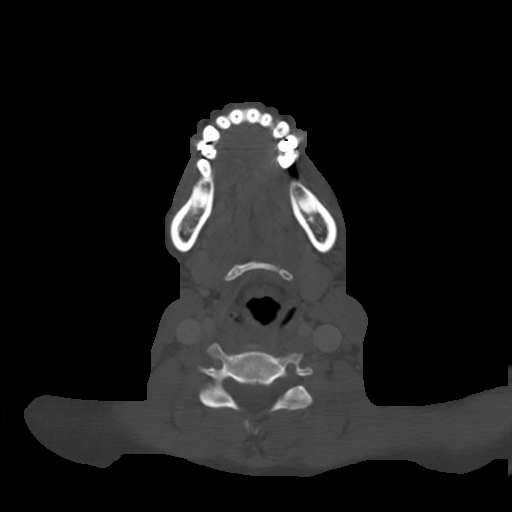
[im 83/111  bone]
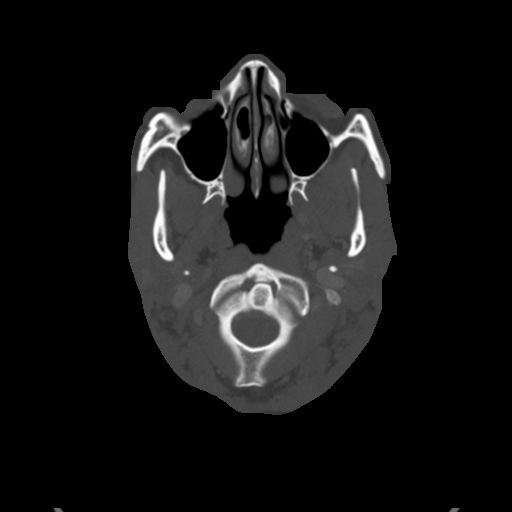

[Series 6: coronal neck · coronal · 0.39mm/px · 3 of 133 slices shown]
[im 27/133  bone]
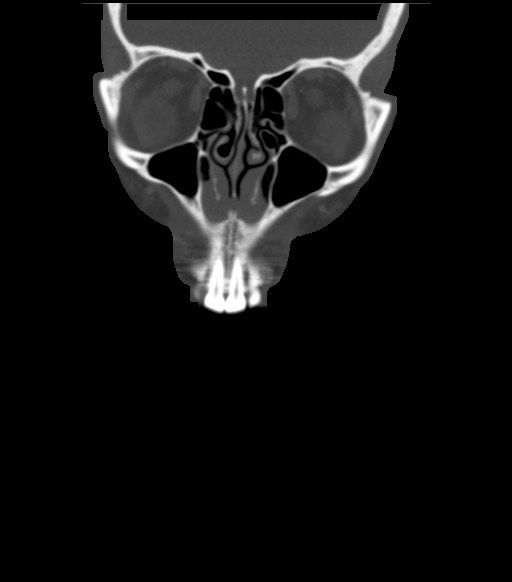
[im 53/133  bone]
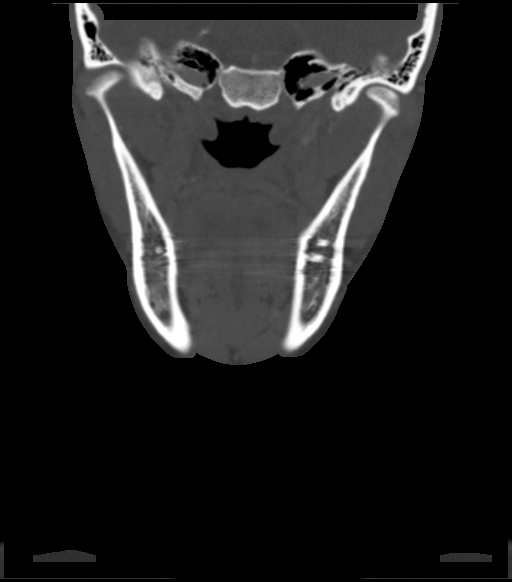
[im 80/133  bone]
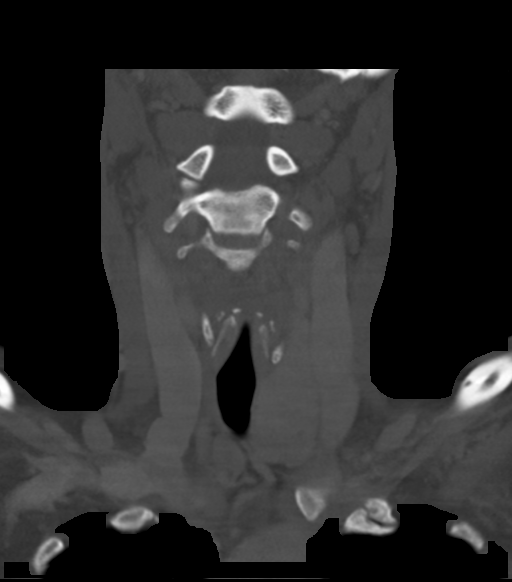

[Series 7: sagittal neck · sagittal · 0.44mm/px · 5 of 96 slices shown, 6 images]
[im 32/96  bone]
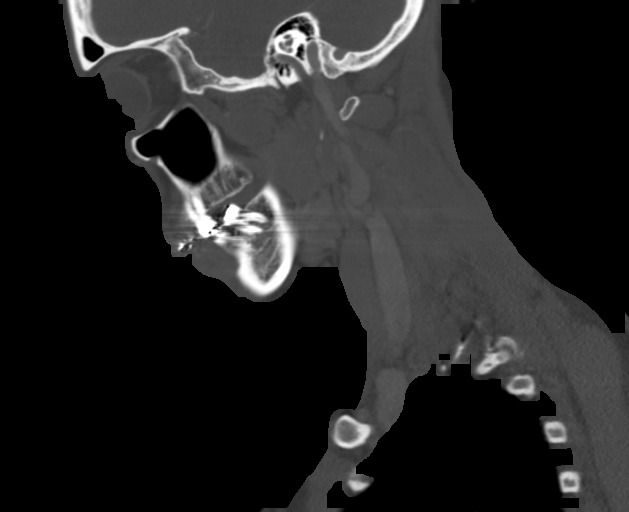
[im 40/96  bone]
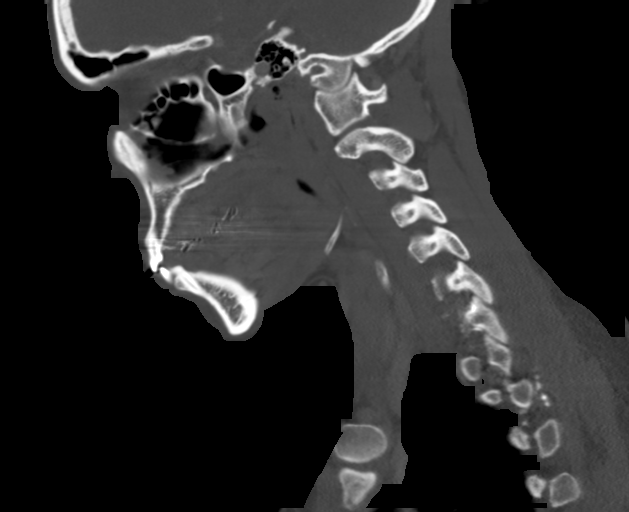
[im 48/96  soft-tissue]
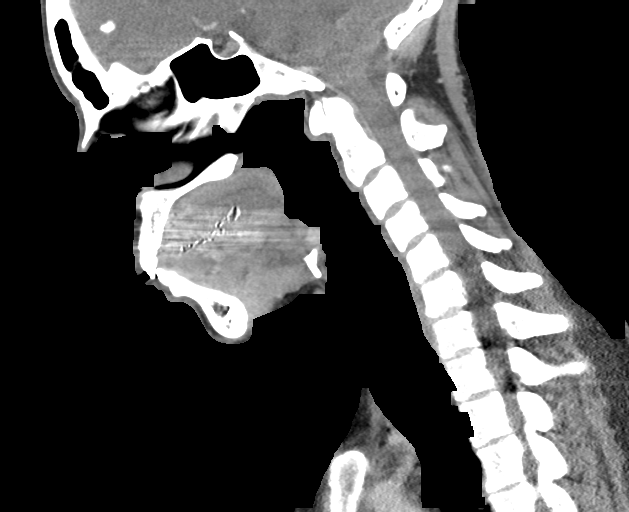
[im 48/96  bone]
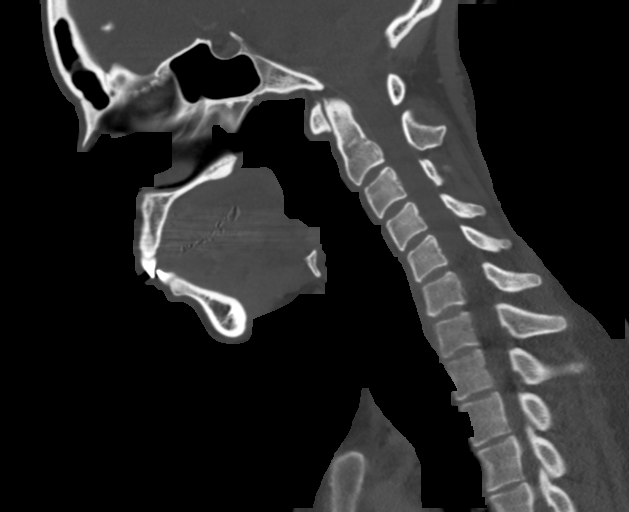
[im 56/96  bone]
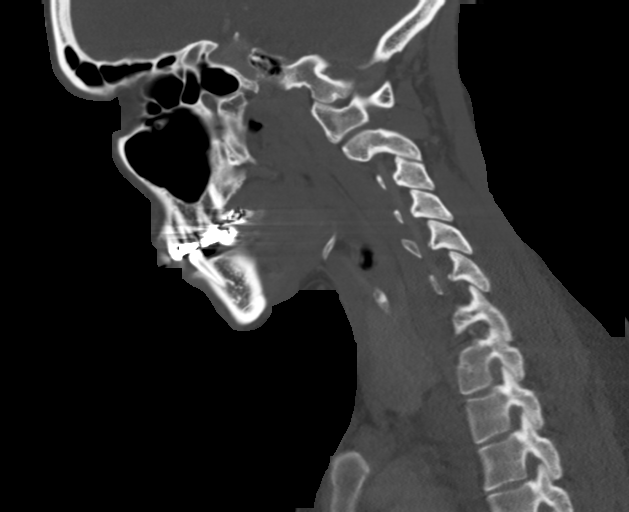
[im 64/96  bone]
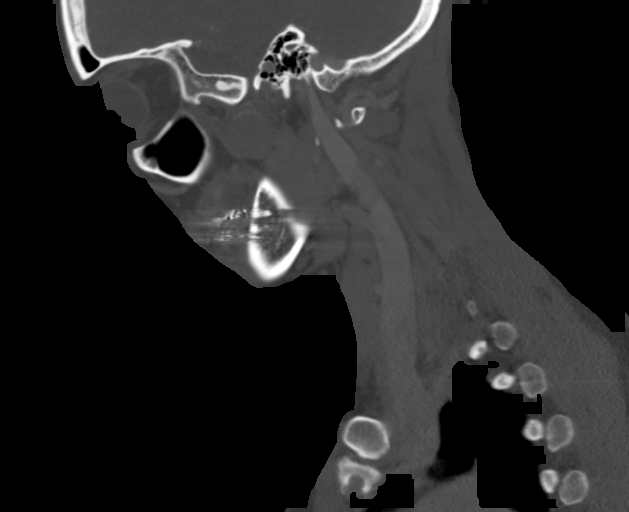

[Series 8: orthogonal ax · axial · 0.45mm/px · z∈[+117,+249]mm · 4 of 110 slices shown, 5 images]
[im 22/110  soft-tissue]
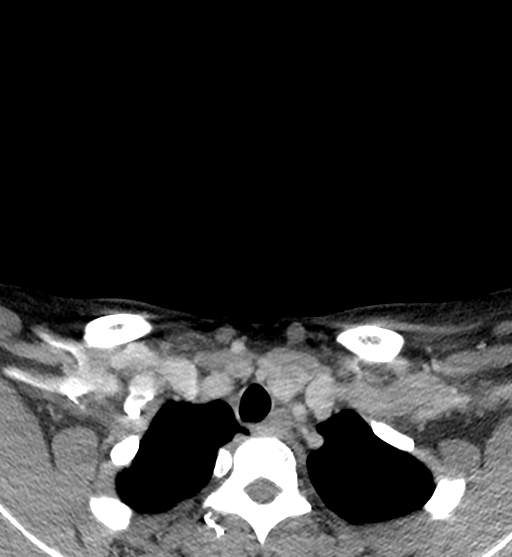
[im 22/110  bone]
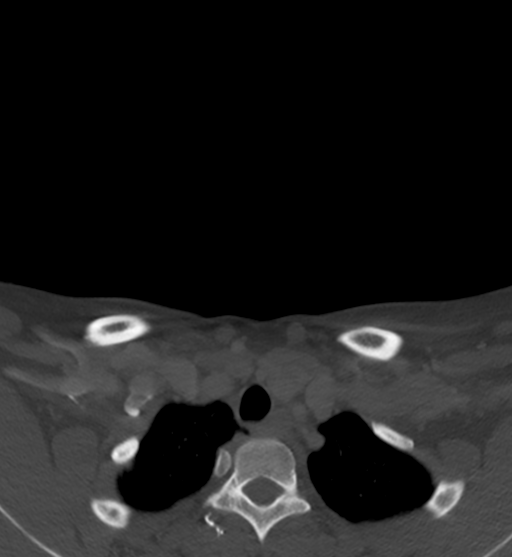
[im 44/110  bone]
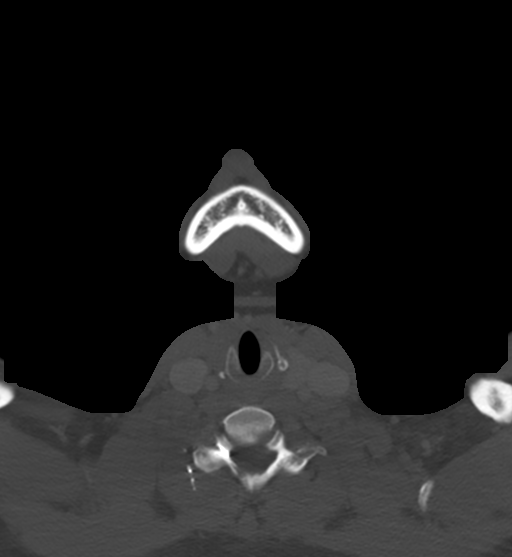
[im 66/110  bone]
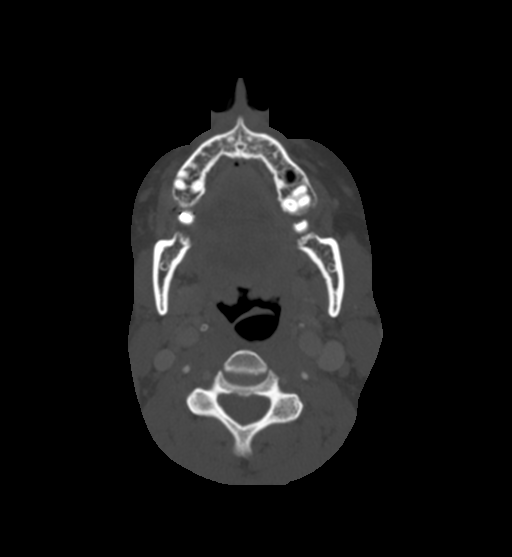
[im 88/110  bone]
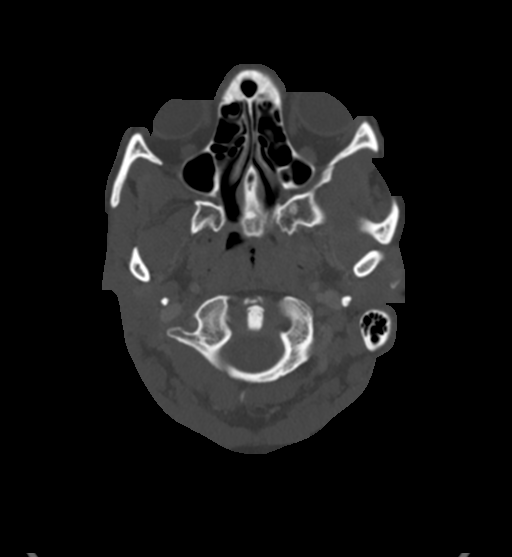

[15 of 33 positions shown; findings below may reference images not displayed]

FINDINGS: PHARYNX AND LARYNX:

--Nasopharynx: Fossae of Ceejay are clear. Normal adenoid
tonsils for age.

--Oral cavity and oropharynx: The palatine and lingual tonsils are
normal. The visible oral cavity and floor of mouth are normal.

--Hypopharynx: Normal vallecula and pyriform sinuses.

--Larynx: Normal epiglottis and pre-epiglottic space. Normal
aryepiglottic and vocal folds.

--Retropharyngeal space: No abscess, effusion or lymphadenopathy.

SALIVARY GLANDS:

--Parotid: No mass lesion or inflammation. No sialolithiasis or
ductal dilatation.

--Submandibular: Symmetric without inflammation. No sialolithiasis
or ductal dilatation.

--Sublingual: Normal. No ranula or other visible lesion of the base
of tongue and floor of mouth.

THYROID: Status post right thyroidectomy. Normal appearance of the
left lobe.

LYMPH NODES: No enlarged or abnormal density lymph nodes.

VASCULAR: Major cervical vessels are patent.

LIMITED INTRACRANIAL: Normal.

VISUALIZED ORBITS: Normal.

MASTOIDS AND VISUALIZED PARANASAL SINUSES: No fluid levels or
advanced mucosal thickening. No mastoid effusion.

SKELETON: No bony spinal canal stenosis. No lytic or blastic
lesions.

UPPER CHEST: Clear.

OTHER: None.
IMPRESSION: 1. No acute abnormality of the neck.
2. Status post right thyroidectomy. Normal appearance of the left
thyroid lobe.

## 2020-01-29 ENCOUNTER — Ambulatory Visit: Payer: BC Managed Care – PPO | Admitting: Gastroenterology

## 2020-02-27 ENCOUNTER — Other Ambulatory Visit: Payer: Self-pay | Admitting: Nurse Practitioner

## 2020-02-27 DIAGNOSIS — G43909 Migraine, unspecified, not intractable, without status migrainosus: Secondary | ICD-10-CM | POA: Diagnosis not present

## 2020-02-27 DIAGNOSIS — Z299 Encounter for prophylactic measures, unspecified: Secondary | ICD-10-CM | POA: Diagnosis not present

## 2020-02-27 DIAGNOSIS — M797 Fibromyalgia: Secondary | ICD-10-CM | POA: Diagnosis not present

## 2020-03-07 ENCOUNTER — Ambulatory Visit (INDEPENDENT_AMBULATORY_CARE_PROVIDER_SITE_OTHER): Payer: BC Managed Care – PPO

## 2020-03-07 ENCOUNTER — Other Ambulatory Visit: Payer: Self-pay

## 2020-03-07 ENCOUNTER — Other Ambulatory Visit: Payer: Self-pay | Admitting: Podiatry

## 2020-03-07 ENCOUNTER — Encounter: Payer: Self-pay | Admitting: Podiatry

## 2020-03-07 ENCOUNTER — Ambulatory Visit (INDEPENDENT_AMBULATORY_CARE_PROVIDER_SITE_OTHER): Payer: BC Managed Care – PPO | Admitting: Podiatry

## 2020-03-07 DIAGNOSIS — M778 Other enthesopathies, not elsewhere classified: Secondary | ICD-10-CM

## 2020-03-07 DIAGNOSIS — L989 Disorder of the skin and subcutaneous tissue, unspecified: Secondary | ICD-10-CM

## 2020-03-07 NOTE — Progress Notes (Signed)
° °  Subjective: 34 y.o. female presenting to the office today for evaluation of bilateral calluses that are very symptomatic and tender to touch.  Patient states that she has had calluses to the plantar aspect of the foot since she was 34 years old.  She was active in the [employer] and playing college sports.  She has not done anything for treatment at the moment.  She presents for further treatment evaluation   Past Medical History:  Diagnosis Date   Anxiety    Arthritis    lower back, right leg   Back pain    Cerebrospinal fluid leak from spinal puncture 03/03/2016   after epidural steroid injection; states has had 2 blood patch procedures    Difficulty swallowing solids    certain foods   Fibromyalgia    GERD (gastroesophageal reflux disease)    Hyperthyroidism    Migraines    Nasal congestion 12/06/2016   Neck pain of over 3 months duration    posterior and right side   Neuropathy    right leg   Restless leg syndrome    Thyroid nodule 11/2016   right   Tracheal deviation    due to thyroid nodule, per pt.   Urinary frequency      Objective:  Physical Exam General: Alert and oriented x3 in no acute distress  Dermatology: Hyperkeratotic lesion(s) present on the bilateral feet subfifth MTPJ and right first MTPJ. Pain on palpation with a central nucleated core noted. Skin is warm, dry and supple bilateral lower extremities. Negative for open lesions or macerations.  Vascular: Palpable pedal pulses bilaterally. No edema or erythema noted. Capillary refill within normal limits.  Neurological: Epicritic and protective threshold grossly intact bilaterally.   Musculoskeletal Exam: Pain on palpation at the keratotic lesion(s) noted. Range of motion within normal limits bilateral. Muscle strength 5/5 in all groups bilateral.  Assessment: 1.  Preulcerative callus lesions bilateral feet   Plan of Care:  1. Patient evaluated 2. Excisional debridement of keratoic  lesion(s) using a chisel blade was performed without incident.  3. Dressed area with light dressing. 4.  Recommend OTC corn and callus remover daily  5.  Patient is to return to the clinic PRN.   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  Currently an academic coach for [employer]  Edrick Kins, DPM [facility]  Dr. Edrick Kins, [place]                                        [place], [place] [address]                Office [phone]  Fax [phone]

## 2020-05-07 ENCOUNTER — Encounter: Payer: Self-pay | Admitting: Internal Medicine

## 2020-05-07 ENCOUNTER — Encounter: Payer: Self-pay | Admitting: Gastroenterology

## 2020-05-07 ENCOUNTER — Other Ambulatory Visit: Payer: Self-pay | Admitting: *Deleted

## 2020-05-07 ENCOUNTER — Telehealth: Payer: Self-pay | Admitting: *Deleted

## 2020-05-07 ENCOUNTER — Telehealth (INDEPENDENT_AMBULATORY_CARE_PROVIDER_SITE_OTHER): Payer: BC Managed Care – PPO | Admitting: Gastroenterology

## 2020-05-07 ENCOUNTER — Other Ambulatory Visit: Payer: Self-pay

## 2020-05-07 DIAGNOSIS — K219 Gastro-esophageal reflux disease without esophagitis: Secondary | ICD-10-CM | POA: Diagnosis not present

## 2020-05-07 DIAGNOSIS — R198 Other specified symptoms and signs involving the digestive system and abdomen: Secondary | ICD-10-CM

## 2020-05-07 DIAGNOSIS — R0989 Other specified symptoms and signs involving the circulatory and respiratory systems: Secondary | ICD-10-CM

## 2020-05-07 DIAGNOSIS — E890 Postprocedural hypothyroidism: Secondary | ICD-10-CM

## 2020-05-07 MED ORDER — PANTOPRAZOLE SODIUM 40 MG PO TBEC: 40.0000 mg | DELAYED_RELEASE_TABLET | Freq: Two times a day (BID) | ORAL | 3 refills | Status: AC

## 2020-05-07 MED ORDER — PROMETHAZINE HCL 25 MG PO TABS
25.0000 mg | ORAL_TABLET | Freq: Four times a day (QID) | ORAL | 3 refills | Status: DC | PRN
Start: 2020-05-07 — End: 2021-09-29

## 2020-05-07 NOTE — Telephone Encounter (Signed)
Linda Reid, you are scheduled for a virtual visit with your provider today.  Just as we do with appointments in the office, we must obtain your consent to participate.  Your consent will be active for this visit and any virtual visit you may have with one of our providers in the next 365 days.  If you have a MyChart account, I can also send a copy of this consent to you electronically.  All virtual visits are billed to your insurance company just like a traditional visit in the office.  As this is a virtual visit, video technology does not allow for your provider to perform a traditional examination.  This may limit your provider's ability to fully assess your condition.  If your provider identifies any concerns that need to be evaluated in person or the need to arrange testing such as labs, EKG, etc, we will make arrangements to do so.  Although advances in technology are sophisticated, we cannot ensure that it will always work on either your end or our end.  If the connection with a video visit is poor, we may have to switch to a telephone visit.  With either a video or telephone visit, we are not always able to ensure that we have a secure connection.   I need to obtain your verbal consent now.   Are you willing to proceed with your visit today?  ?

## 2020-05-07 NOTE — Telephone Encounter (Signed)
Pt consented to a virtual visit. 

## 2020-05-07 NOTE — Patient Instructions (Signed)
We are referring you to Endocrinology.   Continue Protonix twice a day, 30 minutes before breakfast and dinner. I have sent in promethazine to take as needed for nausea. This can make you drowsy, so do not take while driving or operating any machinery.   We will see you in 6 months!   I enjoyed seeing you again today! As you know, I value our relationship and want to provide genuine, compassionate, and quality care. I welcome your feedback. If you receive a survey regarding your visit,  I greatly appreciate you taking time to fill this out. See you next time!  Annitta Needs, PhD, ANP-BC [facility]

## 2020-05-07 NOTE — Progress Notes (Signed)
Primary Care Physician:  Linda Chroman, MD  Primary GI: Dr. Gala Romney   Patient Location: Home   Provider Location: [facility] office   Reason for Visit: Follow-up   Persons present on the virtual encounter, with roles: Patient and NP   Total time (minutes) spent on medical discussion: 25 minutes   Due to COVID-19, visit was conducted using virtual method.  Visit was requested by patient.  Virtual Visit via MyChart Video Note Due to COVID-19, visit is conducted virtually and was requested by patient.   I connected with Linda Reid on 05/07/20 at 10:00 AM EDT by telephone and verified that I am speaking with the correct person using two identifiers.   I discussed the limitations, risks, security and privacy concerns of performing an evaluation and management service by telephone and the availability of in person appointments. I also discussed with the patient that there may be a patient responsible charge related to this service. The patient expressed understanding and agreed to proceed.  Chief Complaint  Patient presents with  . Gastroesophageal Reflux    f/u. Not currently, no change  . Abdominal Pain    still gets pain on left side that comes/goes, does not happen often.     History of Present Illness: 34 year old female with history of globus sensation (chronic, normal manometry), dysphagia, abdominal pain, EGD recently completed in interim from last visit with normal esophagus s/p dilation, normal stomach, normal duodenal bulb and second portion of duodenum. Chronic nausea with GES normal. History of thyroid nodule and right thyroidectomy.   Had second Covid shot yesterday, feeling groggy. Dysphagia improved after EGD. Got the globus sensation again with certain foods that she eats: mainly meats. Tries to chew it longer, small bites. GERD controlled with PPI BID. Promethazine works best for nausea.   Would like referral to endocrine but not in [place]. Feels like  her thyroid levels may be off. Worried about tremors in hand, vision, hair shedding. Last imaging over a year ago.   Left-sided abdominal discomfort intermittent but not often. Keeping dietary log. Constipation improved. Protonix BID.   Past Medical History:  Diagnosis Date  . Anxiety   . Arthritis    lower back, right leg  . Back pain   . Cerebrospinal fluid leak from spinal puncture 03/03/2016   after epidural steroid injection; states has had 2 blood patch procedures   . Difficulty swallowing solids    certain foods  . Fibromyalgia   . GERD (gastroesophageal reflux disease)   . Hyperthyroidism   . Migraines   . Nasal congestion 12/06/2016  . Neck pain of over 3 months duration    posterior and right side  . Neuropathy    right leg  . Restless leg syndrome   . Thyroid nodule 11/2016   right  . Tracheal deviation    due to thyroid nodule, per pt.  . Urinary frequency      Past Surgical History:  Procedure Laterality Date  . DG MYLEOGRAM LUMBAR SPINE ([place] HX)    . DILATION AND CURETTAGE OF UTERUS  06/25/2002  . ESOPHAGEAL MANOMETRY N/A 05/09/2019   normal relaxation of the EG junction, no significant esophageal peristaltic abnormality detected on study  . ESOPHAGOGASTRODUODENOSCOPY (EGD) WITH PROPOFOL N/A 08/28/2018   mild Schatzki ring s/p dilation, normal stomach and duodenum  . ESOPHAGOGASTRODUODENOSCOPY (EGD) WITH PROPOFOL N/A 12/24/2019    normal esophagus s/p dilation, normal stomach, normal duodenal bulb and second portion of duodenum.   Marland Kitchen  MALONEY DILATION N/A 08/28/2018   Procedure: [name] DILATION;  Surgeon: Daneil Dolin, MD;  [facility];  Service: Endoscopy;  Laterality: N/A;  . [name] DILATION N/A 12/24/2019   Procedure: [name] DILATION;  Surgeon: Daneil Dolin, MD;  [facility];  Service: Endoscopy;  Laterality: N/A;  . THYROIDECTOMY Right 12/13/2016   Procedure: RIGHT HEMI-THYROIDECTOMY;  Surgeon: Leta Baptist, MD;  Location: [facility];  Service: ENT;  Laterality: Right;  . TONSILLECTOMY       Current Meds  Medication Sig  . cetirizine (ZYRTEC) 10 MG tablet Take 10 mg by mouth daily.  . Cholecalciferol (VITAMIN D3) 5000 units CAPS Take 1 capsule (5,000 Units total) by mouth daily.  . cyclobenzaprine (FLEXERIL) 5 MG tablet Take 1 tablet (5 mg total) by mouth 3 (three) times daily as needed. (Patient taking differently: Take 5 mg by mouth 3 (three) times daily as needed for muscle spasms. )  . DULoxetine (CYMBALTA) 30 MG capsule Take 1 capsule (30 mg total) by mouth at bedtime.  . gabapentin (NEURONTIN) 300 MG capsule Take 300 mg by mouth 2 (two) times daily.  . norethindrone (MICRONOR) 0.35 MG tablet Take 1 tablet (0.35 mg total) by mouth daily.  . pantoprazole (PROTONIX) 40 MG tablet Take 1 tablet (40 mg total) by mouth 2 (two) times daily before a meal.  . Probiotic Product (PROBIOTIC DAILY PO) Take 1 capsule by mouth daily.   Current Facility-Administered Medications for the 05/07/20 encounter (Video Visit) with Annitta Needs, NP  Medication  . triamcinolone acetonide (KENALOG) 10 MG/ML injection 10 mg     Family History  Problem Relation Age of Onset  . Cancer Maternal Grandmother        breast  . Diabetes Maternal Grandmother   . Hypertension Maternal Grandmother   . Heart disease Maternal Grandfather   . Hypertension Maternal Grandfather   . Hypertension Paternal Grandmother   . Hypertension Paternal Grandfather   . Premature birth Daughter   . Asthma Daughter   . Hypertension Mother   . Diabetes Maternal Aunt   . Hypertension Maternal Aunt   . Stroke Maternal Aunt   . Diabetes Maternal Uncle   . Hypertension Maternal Uncle   . Hypertension Paternal Aunt   . Hypertension Paternal Uncle   . Colon cancer Neg Hx   . Colon polyps Neg Hx     Social History   Socioeconomic History  . Marital status: Legally Separated    Spouse name: Aunna Snooks   . Number of children: 3  . Years of  education: 4 Bachelor  . Highest education level: Not on file  Occupational History  . 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 Smoking status: Never Smoker  . Smokeless tobacco: Never Used  Vaping Use  . Vaping Use: Never used  Substance and Sexual Activity  . Alcohol use: No  . Drug use: No  . Sexual activity: Yes    Birth control/protection: Implant  Other Topics Concern  . Not on file  Social History Narrative   Patient lives with her fiance and 3 children.    Her fiance is the father of her current pregnancy, other three children were fathered by her previous partner.    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  Caffeine use: none   Right-handed   Social Determinants of Health   Financial Resource Strain:   . Difficulty of Paying Living Expenses: Not on file  Food Insecurity:   . Worried About Charity fundraiser in the Last Year: Not on file  . Ran Out of Food in the Last Year: Not on file  Transportation Needs:   . Lack of Transportation (Medical): Not on file  . Lack of Transportation (Non-Medical): Not on file  Physical Activity:   . Days of Exercise per Week: Not on file  . Minutes of Exercise per Session: Not on file  Stress:   . Feeling of Stress : Not on file  Social Connections:   . Frequency of Communication with Friends and Family: Not on file  . Frequency of Social Gatherings with Friends and Family: Not on file  . Attends Religious Services: Not on file  . Active Member of Clubs or Organizations: Not on file  . Attends Archivist Meetings: Not on file  . Marital Status: Not on file       Review of Systems: Gen: Denies fever, chills, anorexia. Denies fatigue, weakness, weight loss.  CV: Denies chest pain, palpitations, syncope, peripheral edema, and  claudication. Resp: Denies dyspnea at rest, cough, wheezing, coughing up blood, and pleurisy. GI: see HPI Derm: Denies rash, itching, dry skin Psych: Denies depression, anxiety, memory loss, confusion. No homicidal or suicidal ideation.  Heme: Denies bruising, bleeding, and enlarged lymph nodes.  Observations/Objective: No distress. Unable to perform physical exam due to video encounter.   Assessment and Plan: Pleasant 34 year old female presenting with history of globus sensation (chronic, normal manometry), dysphagia, abdominal pain, EGD recently completed in interim from last visit with normal esophagus s/p dilation, normal stomach, normal duodenal bulb and second portion of duodenum. Chronic nausea with GES normal and does well with promethazine as needed. History of thyroid nodule and right thyroidectomy.   Dysphagia somewhat improved but still with globus sensation, particularly with meats. Previously established with Endocrine here in [place] and requesting referral to [facility], which we will do. Thorough GI evaluation with normal manometry, BPE unrevealing, EGD recently on file.   GERD well-controlled on Protonix BID. Occasional left-sided abdominal discomfort is occasional and overall improved and at baseline.   Have provided refills for Protonix and promethazine. Will refer to Endocrine. Return in 6 months.    Follow Up Instructions:    I discussed the assessment and treatment plan with the patient. The patient was provided an opportunity to ask questions and all were answered. The patient agreed with the plan and demonstrated an understanding of the instructions.   The patient was advised to call back or seek an in-person evaluation if the symptoms worsen or if the condition fails to improve as anticipated.  I provided 25 minutes of face-to-face time video via MyChart during this encounter.  Annitta Needs, PhD, ANP-BC [facility]

## 2020-05-07 NOTE — Progress Notes (Signed)
Cc'ed to pcp °

## 2020-05-21 ENCOUNTER — Ambulatory Visit (INDEPENDENT_AMBULATORY_CARE_PROVIDER_SITE_OTHER): Payer: BC Managed Care – PPO | Admitting: Internal Medicine

## 2020-05-21 ENCOUNTER — Encounter: Payer: Self-pay | Admitting: Internal Medicine

## 2020-05-21 ENCOUNTER — Other Ambulatory Visit: Payer: Self-pay

## 2020-05-21 VITALS — BP 120/78 | HR 77 | Ht 69.0 in | Wt 184.6 lb

## 2020-05-21 DIAGNOSIS — R7989 Other specified abnormal findings of blood chemistry: Secondary | ICD-10-CM

## 2020-05-21 DIAGNOSIS — R198 Other specified symptoms and signs involving the digestive system and abdomen: Secondary | ICD-10-CM

## 2020-05-21 DIAGNOSIS — Z9009 Acquired absence of other part of head and neck: Secondary | ICD-10-CM

## 2020-05-21 DIAGNOSIS — R0989 Other specified symptoms and signs involving the circulatory and respiratory systems: Secondary | ICD-10-CM | POA: Diagnosis not present

## 2020-05-21 LAB — T4, FREE: Free T4: 0.73 ng/dL (ref 0.60–1.60)

## 2020-05-21 LAB — TSH: TSH: 0.34 u[IU]/mL — ABNORMAL LOW (ref 0.35–4.50)

## 2020-05-21 NOTE — Progress Notes (Signed)
Name: Linda Reid  MRN/ DOB: [id number], 03-02-1986    Age/ Sex: 34 y.o., female    PCP: Glenda Chroman, MD   Reason for Endocrinology Evaluation: S/P right lobectomy      Date of Initial Endocrinology Evaluation: 05/21/2020     HPI: [name] is a 35 y.o. female with a past medical history of GERD, ,chronic pain syndrome and S/P right lobectomy secondary to benign nodule. The patient presented for initial endocrinology clinic visit on 05/21/2020 for consultative assistance with her S/P right lobectomy    In 2018 she was noted to have subclinical hyperthyroidism in 2018, an ultrasound of the thyroid demonstrated a 4.8 cm right lobe nodule . She is S/P right lobectomy in 2018.     Was seen by  Endocrinology in [place].   Sees GI ( Dr. Cyndi Bender)  , S/P esophageal dilatation multiple times.   She continues to have " something stuck" in her throat. Has the need to continuously swallow. Has a swallow eval and was noted to have a slowing of swallowing the pill .    Has postnasal drip - on zyrtec and nasal spray   Has changes in vision, and occasional tremors.  Dry scalp and shedding      HISTORY:  Past Medical History:  Past Medical History:  Diagnosis Date  . Anxiety   . Arthritis    lower back, right leg  . Back pain   . Cerebrospinal fluid leak from spinal puncture 03/03/2016   after epidural steroid injection; states has had 2 blood patch procedures   . Difficulty swallowing solids    certain foods  . Fibromyalgia   . GERD (gastroesophageal reflux disease)   . Hyperthyroidism   . Migraines   . Nasal congestion 12/06/2016  . Neck pain of over 3 months duration    posterior and right side  . Neuropathy    right leg  . Restless leg syndrome   . Thyroid nodule 11/2016   right  . Tracheal deviation    due to thyroid nodule, per pt.  . Urinary frequency     Past Surgical History:  Past Surgical History:  Procedure Laterality Date    . DG MYLEOGRAM LUMBAR SPINE ([place] HX)    . DILATION AND CURETTAGE OF UTERUS  06/25/2002  . ESOPHAGEAL MANOMETRY N/A 05/09/2019   normal relaxation of the EG junction, no significant esophageal peristaltic abnormality detected on study  . ESOPHAGOGASTRODUODENOSCOPY (EGD) WITH PROPOFOL N/A 08/28/2018   mild Schatzki ring s/p dilation, normal stomach and duodenum  . ESOPHAGOGASTRODUODENOSCOPY (EGD) WITH PROPOFOL N/A 12/24/2019    normal esophagus s/p dilation, normal stomach, normal duodenal bulb and second portion of duodenum.   Marland Kitchen MALONEY DILATION N/A 08/28/2018   Procedure: [name] DILATION;  Surgeon: Daneil Dolin, MD;  [facility];  Service: Endoscopy;  Laterality: N/A;  . [name] DILATION N/A 12/24/2019   Procedure: [name] DILATION;  Surgeon: Daneil Dolin, MD;  [facility];  Service: Endoscopy;  Laterality: N/A;  . THYROIDECTOMY Right 12/13/2016   Procedure: RIGHT HEMI-THYROIDECTOMY;  Surgeon: Leta Baptist, MD;  Location: [facility];  Service: ENT;  Laterality: Right;  . TONSILLECTOMY        Social History:  reports that she has never smoked. She has never used smokeless tobacco. She reports that she does not drink alcohol and does not use drugs.  Family History: family history includes Asthma in her daughter; Cancer in  her maternal grandmother; Diabetes in her maternal aunt, maternal grandmother, and maternal uncle; Heart disease in her maternal grandfather; Hypertension in her maternal aunt, maternal grandfather, maternal grandmother, maternal uncle, mother, paternal aunt, paternal grandfather, paternal grandmother, and paternal uncle; Premature birth in her daughter; Stroke in her maternal aunt.   HOME MEDICATIONS: Allergies as of 05/21/2020   No Known Allergies     Medication List       Accurate as of May 21, 2020  7:06 AM. If you have any questions, ask your nurse or doctor.        cetirizine 10 MG tablet Commonly known as: ZYRTEC Take  10 mg by mouth daily.   cyclobenzaprine 5 MG tablet Commonly known as: FLEXERIL Take 1 tablet (5 mg total) by mouth 3 (three) times daily as needed. What changed: reasons to take this   DULoxetine 30 MG capsule Commonly known as: Cymbalta Take 1 capsule (30 mg total) by mouth at bedtime.   gabapentin 300 MG capsule Commonly known as: NEURONTIN Take 300 mg by mouth 2 (two) times daily.   norethindrone 0.35 MG tablet Commonly known as: MICRONOR Take 1 tablet (0.35 mg total) by mouth daily.   pantoprazole 40 MG tablet Commonly known as: PROTONIX Take 1 tablet (40 mg total) by mouth 2 (two) times daily before a meal.   PROBIOTIC DAILY PO Take 1 capsule by mouth daily.   promethazine 25 MG tablet Commonly known as: PHENERGAN Take 1 tablet (25 mg total) by mouth every 6 (six) hours as needed for nausea or vomiting.   Vitamin D3 125 MCG (5000 UT) Caps Take 1 capsule (5,000 Units total) by mouth daily.         REVIEW OF SYSTEMS: A comprehensive ROS was conducted with the patient and is negative except as per HPI     OBJECTIVE:  VS: BP 120/78 (BP Location: Left Arm, Patient Position: Sitting, Cuff Size: Small)   Pulse 77   Ht 5\' 9"  (1.753 m)   Wt 184 lb 9.6 oz (83.7 kg)   SpO2 98%   BMI 27.26 kg/m    Wt Readings from Last 3 Encounters:  12/19/19 180 lb (81.6 kg)  10/25/19 186 lb 6.4 oz (84.6 kg)  08/27/19 183 lb (83 kg)     EXAM: General: Pt appears well and is in NAD  Neck: General: Supple without adenopathy. Thyroid:  No goiter or nodules appreciated.   Lungs: Clear with good BS bilat with no rales, rhonchi, or wheezes  Heart: Auscultation: RRR.  Abdomen: Normoactive bowel sounds, soft, nontender, without masses or organomegaly palpable  Extremities:  BL LE: No pretibial edema normal ROM and strength.  Skin: Hair: Texture and amount normal with gender appropriate distribution Skin Inspection: No rashes Skin Palpation: Skin temperature, texture, and  thickness normal to palpation  Neuro: Cranial nerves: II - XII grossly intact  Motor: Normal strength throughout DTRs: 2+ and symmetric in UE without delay in relaxation phase  Mental Status: Judgment, insight: Intact Orientation: Oriented to time, place, and person Mood and affect: No depression, anxiety, or agitation     DATA REVIEWED:  Results for TREVA, HUYETT (MRN [id number]) as of 05/22/2020 10:11  Ref. Range 05/21/2020 11:23  TSH Latest Ref Range: 0.35 - 4.50 uIU/mL 0.34 (L)  T4,Free(Direct) Latest Ref Range: 0.60 - 1.60 ng/dL 0.73      Thyroid pathology 12/13/2016   Thyroid, lobectomy, Right - BENIGN NODULAR HYPERPLASIA WITH FIBROSIS - NO CARCINOMA IDENTIFIED  Thyroid ultrasound 09/01/2018  Parenchymal Echotexture: Moderately heterogenous  Isthmus: 0.3 cm  Right lobe: 1.6 x 0.8 x 1.2 cm  Left lobe: 5.5 x 2.3 x 2.7 cm  _________________________________________________________  Estimated total number of nodules >/= 1 cm: 0  Number of spongiform nodules >/=  2 cm not described below (TR1): 0  Number of mixed cystic and solid nodules >/= 1.5 cm not described below (TR2): 0  _________________________________________________________  There are no discrete nodules in the thyroid gland. The right lobe is small and heterogeneous. The left lobe is also heterogeneous.  IMPRESSION: There are no discrete nodules which meet criteria for biopsy nor follow-up  After the given history of right thyroidectomy, there is residual tissue in the right thyroid bed which is heterogeneous. This is nonspecific. Correlation with previous operative reports and pathological analysis is recommended.  ASSESSMENT/PLAN/RECOMMENDATIONS:   1. History of right thyroid nodule, status post right lobectomy:   -Patient is clinically and biochemically euthyroid -Pathology report came back benign in 2018 -She had an revealing thyroid ultrasound in 2020 -Reassurance  provided -Patient does not need any further endocrinology work-up or follow-up at this time   2. Globus sensation  -She is under the impression that this may be related to her left lobe, and review of her history it seems that she has had the sensation prior to her right lobectomy, I also reviewed her thyroid ultrasound images and she does not have any nodules or any effect on the esophagus -We have reviewed her swallow evaluation, which showed that the barium pill was stuck in the thoracic part of her esophagus, just above the aortic arch.  I did explain in details the location of this issue, I have tried to reassure her that this is not related to her left lobe. -I have recommended against lobectomy at this point due to increased risk of surgical complications as well as no outcome as it is not the cause. -We discussed GERD as well as postnasal drip as well as neurologic causes for this which is beyond the scope of endocrinology -If the patient continues to insist on left lobectomy, she may need to follow-up with her ENT  Dr. Benjamine Mola and get his opinion about this.   3.  Low TSH:   -Patient is clinically euthyroid -TFT from 2019 were normal -I would recommend repeating this in 6 weeks     I spent 45 minutes preparing to see the patient by review of recent labs, imaging and procedures, obtaining and reviewing separately obtained history, communicating with the patient/family or caregiver, ordering medications, tests or procedures, and documenting clinical information in the EHR including the differential Dx, treatment, and any further evaluation and other management   Follow-up as needed Signed electronically by: Mack Guise, MD  [facility]  [facility] [address] [place], [place] [address] Phone: [phone] FAX: [phone]   CC: Glenda Chroman, MD [place] [address] Phone: [phone] Fax: [phone]   Return to Endocrinology clinic as below: Future Appointments  Date Time Provider Miguel Barrera  05/21/2020 10:30 AM Zakari Bathe, Melanie Crazier, MD [facility] None  11/04/2020 10:00 AM Annitta Needs, NP [facility]

## 2020-05-21 NOTE — Patient Instructions (Signed)
-   Please stop by the lab

## 2020-06-03 DIAGNOSIS — R070 Pain in throat: Secondary | ICD-10-CM | POA: Diagnosis not present

## 2020-06-03 DIAGNOSIS — R1312 Dysphagia, oropharyngeal phase: Secondary | ICD-10-CM | POA: Diagnosis not present

## 2020-06-03 DIAGNOSIS — E079 Disorder of thyroid, unspecified: Secondary | ICD-10-CM | POA: Diagnosis not present

## 2020-06-03 DIAGNOSIS — D440 Neoplasm of uncertain behavior of thyroid gland: Secondary | ICD-10-CM | POA: Diagnosis not present

## 2020-06-17 ENCOUNTER — Other Ambulatory Visit: Payer: Self-pay | Admitting: Otolaryngology

## 2020-06-30 ENCOUNTER — Other Ambulatory Visit (HOSPITAL_COMMUNITY)
Admission: RE | Admit: 2020-06-30 | Discharge: 2020-06-30 | Disposition: A | Discharge status: Home / Self Care | Payer: BC Managed Care – PPO | Source: Ambulatory Visit | Attending: Otolaryngology | Admitting: Otolaryngology

## 2020-06-30 ENCOUNTER — Other Ambulatory Visit: Payer: Self-pay

## 2020-06-30 ENCOUNTER — Encounter (HOSPITAL_COMMUNITY): Payer: Self-pay

## 2020-06-30 ENCOUNTER — Encounter (HOSPITAL_COMMUNITY)
Admission: RE | Admit: 2020-06-30 | Discharge: 2020-06-30 | Disposition: A | Discharge status: Home / Self Care | Payer: BC Managed Care – PPO | Source: Ambulatory Visit | Attending: Otolaryngology | Admitting: Otolaryngology

## 2020-06-30 DIAGNOSIS — Z01818 Encounter for other preprocedural examination: Secondary | ICD-10-CM | POA: Insufficient documentation

## 2020-06-30 DIAGNOSIS — Z820 Family history of epilepsy and other diseases of the nervous system: Secondary | ICD-10-CM | POA: Diagnosis not present

## 2020-06-30 DIAGNOSIS — G2581 Restless legs syndrome: Secondary | ICD-10-CM | POA: Diagnosis not present

## 2020-06-30 DIAGNOSIS — R251 Tremor, unspecified: Secondary | ICD-10-CM | POA: Diagnosis not present

## 2020-06-30 DIAGNOSIS — F419 Anxiety disorder, unspecified: Secondary | ICD-10-CM | POA: Insufficient documentation

## 2020-06-30 DIAGNOSIS — E041 Nontoxic single thyroid nodule: Secondary | ICD-10-CM | POA: Insufficient documentation

## 2020-06-30 DIAGNOSIS — K219 Gastro-esophageal reflux disease without esophagitis: Secondary | ICD-10-CM | POA: Diagnosis not present

## 2020-06-30 DIAGNOSIS — Z79899 Other long term (current) drug therapy: Secondary | ICD-10-CM | POA: Diagnosis not present

## 2020-06-30 DIAGNOSIS — Z01812 Encounter for preprocedural laboratory examination: Secondary | ICD-10-CM | POA: Diagnosis present

## 2020-06-30 DIAGNOSIS — Z20822 Contact with and (suspected) exposure to covid-19: Secondary | ICD-10-CM | POA: Insufficient documentation

## 2020-06-30 DIAGNOSIS — G629 Polyneuropathy, unspecified: Secondary | ICD-10-CM | POA: Diagnosis not present

## 2020-06-30 LAB — CBC
HCT: 42.3 % (ref 36.0–46.0)
Hemoglobin: 13.2 g/dL (ref 12.0–15.0)
MCH: 30.8 pg (ref 26.0–34.0)
MCHC: 31.2 g/dL (ref 30.0–36.0)
MCV: 98.6 fL (ref 80.0–100.0)
Platelets: 265 10*3/uL (ref 150–400)
RBC: 4.29 MIL/uL (ref 3.87–5.11)
RDW: 12.1 % (ref 11.5–15.5)
WBC: 5.7 10*3/uL (ref 4.0–10.5)
nRBC: 0 % (ref 0.0–0.2)

## 2020-06-30 LAB — SARS CORONAVIRUS 2 (TAT 6-24 HRS): SARS Coronavirus 2: NEGATIVE

## 2020-06-30 LAB — BASIC METABOLIC PANEL
Anion gap: 6 (ref 5–15)
BUN: 9 mg/dL (ref 6–20)
CO2: 28 mmol/L (ref 22–32)
Calcium: 8.7 mg/dL — ABNORMAL LOW (ref 8.9–10.3)
Chloride: 105 mmol/L (ref 98–111)
Creatinine, Ser: 0.82 mg/dL (ref 0.44–1.00)
GFR, Estimated: >60 mL/min (ref >60)
Glucose, Bld: 90 mg/dL (ref 70–99)
Potassium: 4.2 mmol/L (ref 3.5–5.1)
Sodium: 139 mmol/L (ref 135–145)

## 2020-06-30 NOTE — Pre-Procedure Instructions (Signed)
Linda Reid  06/30/2020      [facility] - [place], [place] - [id number] [place]  [address] Phone: [phone] Fax: [phone]    Your procedure is scheduled on Nov. 10  Report to [facility] entrance A at 6:30 A.M.  Call this number if you have problems the morning of surgery:  [phone]   Remember:  Do not eat or drink after midnight.      Take these medicines the morning of surgery with A SIP OF WATER :              Cetirizine (zertec)             Gabapentin (neurontin)             norethindone (micronor)             Pantoprazole (protonix)               If needed:  Promethazine (phenergan)           7 days prior to surgery STOP taking any Aspirin (unless otherwise instructed by your surgeon), Aleve, Naproxen, Ibuprofen, Motrin, Advil, Goody's, BC's, all herbal medications, fish oil, and all vitamins.    Do not wear jewelry, make-up or nail polish.  Do not wear lotions, powders, or perfumes, or deodorant.  Do not shave 48 hours prior to surgery.  Men may shave face and neck.  Do not bring valuables to the hospital.  [facility] is not responsible for any belongings or valuables.  Contacts, dentures or bridgework may not be worn into surgery.  Leave your suitcase in the car.  After surgery it may be brought to your room.  For patients admitted to the hospital, discharge time will be determined by your treatment team.  Patients discharged the day of surgery will not be allowed to drive home.   Special instructions:   [facility]- Preparing For Surgery  Before surgery, you can play an important role. Because skin is not sterile, your skin needs to be as free of germs as possible. You can reduce the number of germs on your skin by washing with CHG (chlorahexidine gluconate) Soap before surgery.  CHG is an antiseptic cleaner which kills germs and bonds with the skin to continue killing germs even after washing.     Oral Hygiene is also important to reduce your risk of infection.  Remember - BRUSH YOUR TEETH THE MORNING OF SURGERY WITH YOUR REGULAR TOOTHPASTE  Please do not use if you have an allergy to CHG or antibacterial soaps. If your skin becomes reddened/irritated stop using the CHG.  Do not shave (including legs and underarms) for at least 48 hours prior to first CHG shower. It is OK to shave your face.  Please follow these instructions carefully.   1. Shower the NIGHT BEFORE SURGERY and the MORNING OF SURGERY with CHG.   2. If you chose to wash your hair, wash your hair first as usual with your normal shampoo.  3. After you shampoo, rinse your hair and body thoroughly to remove the shampoo.  4. Use CHG as you would any other liquid soap. You can apply CHG directly to the skin and wash gently with a scrungie or a clean washcloth.   5. Apply the CHG Soap to your body ONLY FROM THE NECK DOWN.  Do not use on open wounds or open sores. Avoid contact with your eyes, ears, mouth and genitals (  private parts). Wash Face and genitals (private parts)  with your normal soap.  6. Wash thoroughly, paying special attention to the area where your surgery will be performed.  7. Thoroughly rinse your body with warm water from the neck down.  8. DO NOT shower/wash with your normal soap after using and rinsing off the CHG Soap.  9. Pat yourself dry with a CLEAN TOWEL.  10. Wear CLEAN PAJAMAS to bed the night before surgery, wear comfortable clothes the morning of surgery  11. Place CLEAN SHEETS on your bed the night of your first shower and DO NOT SLEEP WITH PETS.    Day of Surgery:  Do not apply any deodorants/lotions.  Please wear clean clothes to the hospital/surgery center.   Remember to brush your teeth WITH YOUR REGULAR TOOTHPASTE.    Please read over the following fact sheets that you were given.

## 2020-06-30 NOTE — Progress Notes (Addendum)
Anesthesia Chart Review:  Case: [id number] Date/Time: 07/02/20 0815   Procedure: COMPLETION THYROIDECTOMY (N/A )   Anesthesia type: General   Pre-op diagnosis: THYROID NODULE   Location: [facility] OR ROOM 09 / [place] OR   Surgeons: Leta Baptist, MD      DISCUSSION: Patient is a 34 year old female scheduled for the above procedure. She is s/p right hemi-thyroidectomy on 12/13/16 for benign nodule with tracheal deviation and subclinical hyperthyroidism. She was referred back to ENT given recurrent globus sensation symptoms (see below PROVIDERS for recent summary of endocrinology and GI input).  Other history includes never smoker, GERD, anxiety, RLE neuropathy, CSF leak post epidural infection (2017), RLS, migraines, esophageal dilation (12/24/19).  Patient had her PAT RN interview on 06/30/20 AM. I also had some communication with patient and nurse over the phone. Patient reported worsening right sided weakness, but still able to ambulate without assistance and deneid history of dropping things with her right hand. 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 Symptoms resolved by the next day, but since that time feels like the weakness that she has had for years is worse and more constant than before. She also has a left hand tremor, whereas before it was more in the right hand. (She does have a history of subclinical hyperthyroidism.) She reports that she discussed these symptoms with Dr. Benjamine Mola and was told she should follow-up with neurology in the future (last visit 2018).   Although, she reported intermittent weakness for the past three years or so, she thinks it has become worse since her facial pressure/ear ringing event in October. She has not discussed her symptoms with her PCP, so I  contacted her [facility]. They were able to make patient an appointment with Darrin Nipper, DNP for 06/30/20 at 2:15 PM. I want to make sure she does not feel patient needs further evaluation or referral prior to completion thyroidectomy under general anesthesia. If further work-up is felt warranted then surgery would likely need to be postponed given surgery is scheduled for 07/02/20. Of note, patient also continues to have intermittent atypical, sharp chest pains for which she had a negative ETT earlier this year. Dr. Johnsie Cancel felt symptoms were related to GI etiology. Patient continues to deny any exertional chest pain. EKG normal.   06/30/20 routine preoperative COVID-19 test is in process.  Chart will be left for follow-up. Will update Dr. Benjamine Mola that patient was referred to her PCP for visit on 06/30/20.    ADDENDUM 07/01/20 2:15 PM: COVID-19 test negative. 06/30/20 office visit note from Darrin Nipper, [place], [place] reviewed. She notes patient feels like she has had gradual right sided weakness for at least a month, but no issues with gait, coordination or fine motor issues. Tremor in both hands noted, and questioned if thyroid related. Patient does have a history of neurologic disease in her family, so she will consider referring patient to neurology or rheumatology post-operatively if symptoms persist. In the interim, she ordered ESR-F, ANA, rheumatoid factor, C-reactive protein to evaluate for lupus and RA (results pending). No additional preoperative recommendations noted. Primary care note also reviewed with anesthesiologist Tamela Gammon, MD.  Anesthesia team to evaluate on the day of surgery. I have updated Heather at Dr. Deeann Saint  office.    VS: BP 121/68   Pulse 69   Temp 36.8 C (Oral)   Resp 18   Ht 5' 9"  (1.753 m)   Wt 85.3 kg   LMP 06/20/2020   SpO2 99%   BMI 27.77 kg/m    PROVIDERS: Glenda Chroman, MD is PCP [facility]). Primarily she is seen by Darrin Nipper, DNP, Gildardo Cranker, MD is cardiologist. Last evaluation 08/27/19 for atypical chest pain and had a normal ETT.  Kelton Pillar, Mammie Lorenzo, MD is endocrinologist Last visit 05/21/20. Patient initially felt clinically and biochemically euthyroid, so as needed follow-up recommended. However, labs showed a TSH minimally depressed with normal Free T4, so recheck labs in 6 weeks recommended. She referred patient back to ENT to further evaluate globus sensation, although was not recommending left thyroidectomy at that time until other possible causes considered. Sunnie Nielsen, NP/Rouke, Herbie Baltimore, MD is GI [facility]). Last virtual visit 05/07/20. Dysphagia somewhat improved but still with globus sensation. GI evaluation with normal manometry 05/09/19 and normal esophagus, s/p dilation 12/24/19. She referred patient to endocrinology.  Sarina Ill, MD is neurologist [facility]). Last visit 03/24/17. Ashok Pall, MD is neurosurgeon   LABS: Labs reviewed: Acceptable for surgery. On 05/21/20 at endocrinology office, TSH minimally depressed at 0.34 (0.35-4.50) with normal Free T4 0.73 (0.60-1.60)--Dr. Shamleffer recommended repeat labs in 6 weeks.  (all labs ordered are listed, but only abnormal results are displayed)  Labs Reviewed  BASIC METABOLIC PANEL - Abnormal; Notable for the following components:      Result Value   Calcium 8.7 (*)    All other components within normal limits  CBC    OTHER:  EGD 12/24/19: Impression: - Normal esophagus. Dilated. - Normal stomach. - Normal duodenal bulb and second portion of the duodenum. - No specimens collected.  Esophageal Manometry 05/09/19: Impressions: Normal relaxation of the EG junction No significant esophageal peristaltic abnormality detected on the study based on [place] classification.  Esophogram/Barium Tablet Study 01/18/19: IMPRESSION: Brief sticking of the barium tablet just above the aortic  arch with reproduction of the patient's symptoms. No visible stricture in this area. No other abnormality seen.   IMAGES: US Thyroid 09/01/18: IMPRESSION: - There are no discrete nodules which meet criteria for biopsy nor follow-up - After the given history of right thyroidectomy, there is residual tissue in the right thyroid bed which is heterogeneous. This is nonspecific. Correlation with previous operative reports and pathological analysis is recommended. - The above is in keeping with the ACR TI-RADS recommendations - J Am Coll Radiol 2017;14:587-595.   EKG: 06/30/20: NSR   CV: ETT 09/10/19:  Blood pressure demonstrated a hypertensive response to exercise.  There was no ST segment deviation noted during stress.  Duke treadmill score indicative of low risk of cardiac events.    Past Medical History:  Diagnosis Date  . Anxiety   . Arthritis    lower back, right leg  . Back pain   . Cerebrospinal fluid leak from spinal puncture 03/03/2016   after epidural steroid injection; states has had 2 blood patch procedures   . Difficulty swallowing solids    certain foods  . GERD (gastroesophageal reflux disease)   . Hyperthyroidism   . Migraines   . Nasal congestion 12/06/2016  . Neck pain of over 3 months duration    posterior and right side  . Neuropathy    right leg  . Restless leg syndrome   .  Thyroid nodule 11/2016   right  . Tracheal deviation    due to thyroid nodule, per pt.  . Urinary frequency     Past Surgical History:  Procedure Laterality Date  . DG MYLEOGRAM LUMBAR SPINE ([place] HX)    . DILATION AND CURETTAGE OF UTERUS  06/25/2002  . ESOPHAGEAL MANOMETRY N/A 05/09/2019   normal relaxation of the EG junction, no significant esophageal peristaltic abnormality detected on study  . ESOPHAGOGASTRODUODENOSCOPY (EGD) WITH PROPOFOL N/A 08/28/2018   mild Schatzki ring s/p dilation, normal stomach and duodenum  . ESOPHAGOGASTRODUODENOSCOPY (EGD) WITH PROPOFOL  N/A 12/24/2019    normal esophagus s/p dilation, normal stomach, normal duodenal bulb and second portion of duodenum.   Marland Kitchen MALONEY DILATION N/A 08/28/2018   Procedure: [name] DILATION;  Surgeon: Daneil Dolin, MD;  [facility];  Service: Endoscopy;  Laterality: N/A;  . [name] DILATION N/A 12/24/2019   Procedure: [name] DILATION;  Surgeon: Daneil Dolin, MD;  Location: [facility];  Service: Endoscopy;  Laterality: N/A;  . THYROIDECTOMY Right 12/13/2016   Procedure: RIGHT HEMI-THYROIDECTOMY;  Surgeon: Leta Baptist, MD;  Location: [place];  Service: ENT;  Laterality: Right;  . TONSILLECTOMY      MEDICATIONS: . cetirizine (ZYRTEC) 10 MG tablet  . Cholecalciferol (VITAMIN D3) 5000 units CAPS  . cyclobenzaprine (FLEXERIL) 5 MG tablet  . DULoxetine (CYMBALTA) 30 MG capsule  . gabapentin (NEURONTIN) 300 MG capsule  . norethindrone (MICRONOR) 0.35 MG tablet  . pantoprazole (PROTONIX) 40 MG tablet  . Probiotic Product (PROBIOTIC DAILY PO)  . promethazine (PHENERGAN) 25 MG tablet   . triamcinolone acetonide (KENALOG) 10 MG/ML injection 10 mg    Myra Gianotti, PA-C Surgical Short Stay/Anesthesiology [facility] Phone [phone] [facility] Phone [phone] 06/30/2020 2:08 PM

## 2020-06-30 NOTE — Progress Notes (Signed)
PCP - [place] Cardiologist - nishin GI:  Dr. Cyndi Bender @ [facility]  Gastrointerologist Neurologist: aherd  Chest x-ray - na EKG - 06/30/20 Stress Test - 09/10/19 ECHO - na Cardiac Cath - na  Sleep Study - na   Fasting Blood Sugar - na   Blood Thinner Instructions: na Aspirin Instructions: na   COVID TEST- 06/30/20   Anesthesia review: yes Pt. Reporting more right side weakness from shoulder area to the legs.States it's gotten worst since Oct. Reporting sharpe pain in chest area lasting few min. To several hours.Usually 1x a week. States the pain can come and go and then it will ease off. Pt. Reported during a volleyball game, she is a Leisure centre manager, she had pressure on the side of her face , had ringing in the right ear and couldn't hear out of the left ear. The next day is was gone.Pt. Has appointment with PCP at 2:15 pm today.  Patient denies shortness of breath, fever, cough and chest pain at PAT appointment   All instructions explained to the patient, with a verbal understanding of the material. Patient agrees to go over the instructions while at home for a better understanding. Patient also instructed to self quarantine after being tested for COVID-19. The opportunity to ask questions was provided.

## 2020-07-01 NOTE — Anesthesia Preprocedure Evaluation (Addendum)
Anesthesia Evaluation  Patient identified by MRN, date of birth, ID band Patient awake    Reviewed: Allergy & Precautions, NPO status , Patient's Chart, lab work & pertinent test results  Airway Mallampati: II  TM Distance: >3 FB Neck ROM: Full    Dental  (+) Teeth Intact, Chipped,    Pulmonary neg pulmonary ROS,    Pulmonary exam normal breath sounds clear to auscultation       Cardiovascular negative cardio ROS Normal cardiovascular exam Rhythm:Regular Rate:Normal     Neuro/Psych  Headaches, PSYCHIATRIC DISORDERS Anxiety Depression    GI/Hepatic Neg liver ROS, GERD  Medicated,  Endo/Other  Hyperthyroidism   Renal/GU negative Renal ROS     Musculoskeletal  (+) Arthritis ,   Abdominal   Peds  Hematology negative hematology ROS (+)   Anesthesia Other Findings Day of surgery medications reviewed with the patient.  Reproductive/Obstetrics                            Anesthesia Physical Anesthesia Plan  ASA: II  Anesthesia Plan: General   Post-op Pain Management:    Induction: Intravenous  PONV Risk Score and Plan: 4 or greater and Midazolam, Ondansetron, Dexamethasone and Scopolamine patch - Pre-op  Airway Management Planned: Oral ETT and Video Laryngoscope Planned  Additional Equipment:   Intra-op Plan:   Post-operative Plan: Extubation in OR  Informed Consent: I have reviewed the patients History and Physical, chart, labs and discussed the procedure including the risks, benefits and alternatives for the proposed anesthesia with the patient or authorized representative who has indicated his/her understanding and acceptance.       Plan Discussed with: CRNA  Anesthesia Plan Comments: (PAT note written by Myra Gianotti, PA-C. )       Anesthesia Quick Evaluation

## 2020-07-02 ENCOUNTER — Ambulatory Visit (HOSPITAL_COMMUNITY): Payer: BC Managed Care – PPO | Admitting: Certified Registered Nurse Anesthetist

## 2020-07-02 ENCOUNTER — Ambulatory Visit (HOSPITAL_COMMUNITY): Payer: BC Managed Care – PPO | Admitting: Vascular Surgery

## 2020-07-02 ENCOUNTER — Encounter (HOSPITAL_COMMUNITY): Payer: Self-pay | Admitting: Otolaryngology

## 2020-07-02 ENCOUNTER — Observation Stay (HOSPITAL_COMMUNITY)
Admission: RE | Admit: 2020-07-02 | Discharge: 2020-07-03 | Disposition: A | Discharge status: Home / Self Care | Payer: BC Managed Care – PPO | Attending: Otolaryngology | Admitting: Otolaryngology

## 2020-07-02 ENCOUNTER — Other Ambulatory Visit: Payer: Self-pay

## 2020-07-02 ENCOUNTER — Encounter (HOSPITAL_COMMUNITY)
Admission: RE | Disposition: A | Discharge status: Home / Self Care | Payer: Self-pay | Source: Home / Self Care | Attending: Otolaryngology

## 2020-07-02 DIAGNOSIS — E049 Nontoxic goiter, unspecified: Secondary | ICD-10-CM | POA: Diagnosis present

## 2020-07-02 DIAGNOSIS — Z23 Encounter for immunization: Secondary | ICD-10-CM | POA: Insufficient documentation

## 2020-07-02 DIAGNOSIS — Z9889 Other specified postprocedural states: Secondary | ICD-10-CM

## 2020-07-02 DIAGNOSIS — E890 Postprocedural hypothyroidism: Secondary | ICD-10-CM

## 2020-07-02 HISTORY — PX: THYROIDECTOMY: SHX17

## 2020-07-02 LAB — CALCIUM: Calcium: 8.7 mg/dL — ABNORMAL LOW (ref 8.9–10.3)

## 2020-07-02 LAB — POCT PREGNANCY, URINE: Preg Test, Ur: NEGATIVE

## 2020-07-02 SURGERY — THYROIDECTOMY
Anesthesia: General | Site: Neck

## 2020-07-02 MED ORDER — ONDANSETRON HCL 4 MG/2ML IJ SOLN
INTRAMUSCULAR | Status: DC | PRN
  Administered 2020-07-02: 4 mg via INTRAVENOUS

## 2020-07-02 MED ORDER — KCL IN DEXTROSE-NACL 20-5-0.45 MEQ/L-%-% IV SOLN
INTRAVENOUS | Status: DC
  Filled 2020-07-02: qty 1000

## 2020-07-02 MED ORDER — CALCIUM CARBONATE-VITAMIN D 500-200 MG-UNIT PO TABS
2.0000 | ORAL_TABLET | Freq: Two times a day (BID) | ORAL | Status: DC
  Administered 2020-07-02 – 2020-07-03 (×2): 2 via ORAL
  Filled 2020-07-02 (×2): qty 2

## 2020-07-02 MED ORDER — ONDANSETRON HCL 4 MG/2ML IJ SOLN
4.0000 mg | INTRAMUSCULAR | Status: DC | PRN
  Administered 2020-07-02: 4 mg via INTRAVENOUS
  Filled 2020-07-02: qty 2

## 2020-07-02 MED ORDER — ORAL CARE MOUTH RINSE: 15.0000 mL | Freq: Once | OROMUCOSAL | Status: AC

## 2020-07-02 MED ORDER — PROMETHAZINE HCL 25 MG/ML IJ SOLN: 6.2500 mg | INTRAMUSCULAR | Status: DC | PRN

## 2020-07-02 MED ORDER — MIDAZOLAM HCL 2 MG/2ML IJ SOLN
INTRAMUSCULAR | Status: AC
  Filled 2020-07-02: qty 2

## 2020-07-02 MED ORDER — ACETAMINOPHEN 500 MG PO TABS
1000.0000 mg | ORAL_TABLET | Freq: Once | ORAL | Status: AC
  Administered 2020-07-02: 1000 mg via ORAL
  Filled 2020-07-02: qty 2

## 2020-07-02 MED ORDER — OXYCODONE-ACETAMINOPHEN 5-325 MG PO TABS
1.0000 | ORAL_TABLET | ORAL | Status: DC | PRN
  Administered 2020-07-02 – 2020-07-03 (×2): 1 via ORAL
  Filled 2020-07-02 (×2): qty 1

## 2020-07-02 MED ORDER — LACTATED RINGERS IV SOLN: INTRAVENOUS | Status: DC | PRN

## 2020-07-02 MED ORDER — DULOXETINE HCL 30 MG PO CPEP
30.0000 mg | ORAL_CAPSULE | Freq: Every day | ORAL | Status: DC
  Administered 2020-07-02: 30 mg via ORAL
  Filled 2020-07-02: qty 1

## 2020-07-02 MED ORDER — DEXAMETHASONE SODIUM PHOSPHATE 10 MG/ML IJ SOLN
INTRAMUSCULAR | Status: DC | PRN
  Administered 2020-07-02: 5 mg via INTRAVENOUS

## 2020-07-02 MED ORDER — FENTANYL CITRATE (PF) 250 MCG/5ML IJ SOLN
INTRAMUSCULAR | Status: AC
  Filled 2020-07-02: qty 5

## 2020-07-02 MED ORDER — LACTATED RINGERS IV SOLN: INTRAVENOUS | Status: DC

## 2020-07-02 MED ORDER — GABAPENTIN 300 MG PO CAPS
300.0000 mg | ORAL_CAPSULE | Freq: Once | ORAL | Status: AC
  Administered 2020-07-02: 300 mg via ORAL
  Filled 2020-07-02: qty 1

## 2020-07-02 MED ORDER — PHENYLEPHRINE HCL-NACL 10-0.9 MG/250ML-% IV SOLN
INTRAVENOUS | Status: DC | PRN
  Administered 2020-07-02: 40 ug/min via INTRAVENOUS

## 2020-07-02 MED ORDER — CEFAZOLIN SODIUM-DEXTROSE 2-3 GM-%(50ML) IV SOLR
INTRAVENOUS | Status: DC | PRN
  Administered 2020-07-02: 2 g via INTRAVENOUS

## 2020-07-02 MED ORDER — MIDAZOLAM HCL 2 MG/2ML IJ SOLN
INTRAMUSCULAR | Status: DC | PRN
  Administered 2020-07-02: 2 mg via INTRAVENOUS

## 2020-07-02 MED ORDER — LIDOCAINE-EPINEPHRINE 1 %-1:100000 IJ SOLN
INTRAMUSCULAR | Status: AC
  Filled 2020-07-02: qty 1

## 2020-07-02 MED ORDER — CHLORHEXIDINE GLUCONATE 0.12 % MT SOLN
15.0000 mL | Freq: Once | OROMUCOSAL | Status: AC
  Administered 2020-07-02: 15 mL via OROMUCOSAL
  Filled 2020-07-02: qty 15

## 2020-07-02 MED ORDER — INFLUENZA VAC SPLIT QUAD 0.5 ML IM SUSY
0.5000 mL | PREFILLED_SYRINGE | INTRAMUSCULAR | Status: AC
  Administered 2020-07-03: 0.5 mL via INTRAMUSCULAR
  Filled 2020-07-02: qty 0.5

## 2020-07-02 MED ORDER — FENTANYL CITRATE (PF) 250 MCG/5ML IJ SOLN
INTRAMUSCULAR | Status: DC | PRN
  Administered 2020-07-02: 75 ug via INTRAVENOUS
  Administered 2020-07-02: 25 ug via INTRAVENOUS
  Administered 2020-07-02 (×2): 50 ug via INTRAVENOUS

## 2020-07-02 MED ORDER — NORETHINDRONE 0.35 MG PO TABS: 1.0000 | ORAL_TABLET | Freq: Every day | ORAL | Status: DC

## 2020-07-02 MED ORDER — FENTANYL CITRATE (PF) 100 MCG/2ML IJ SOLN
25.0000 ug | INTRAMUSCULAR | Status: DC | PRN
  Administered 2020-07-02 (×3): 50 ug via INTRAVENOUS

## 2020-07-02 MED ORDER — SUCCINYLCHOLINE CHLORIDE 200 MG/10ML IV SOSY
PREFILLED_SYRINGE | INTRAVENOUS | Status: AC
  Filled 2020-07-02: qty 10

## 2020-07-02 MED ORDER — LIDOCAINE-EPINEPHRINE 1 %-1:100000 IJ SOLN
INTRAMUSCULAR | Status: DC | PRN
  Administered 2020-07-02: 2 mL

## 2020-07-02 MED ORDER — CEFAZOLIN SODIUM-DEXTROSE 1-4 GM/50ML-% IV SOLN
1.0000 g | Freq: Three times a day (TID) | INTRAVENOUS | Status: AC
  Administered 2020-07-02 – 2020-07-03 (×3): 1 g via INTRAVENOUS
  Filled 2020-07-02 (×3): qty 50

## 2020-07-02 MED ORDER — FENTANYL CITRATE (PF) 100 MCG/2ML IJ SOLN
INTRAMUSCULAR | Status: AC
  Filled 2020-07-02: qty 2

## 2020-07-02 MED ORDER — LIDOCAINE 2% (20 MG/ML) 5 ML SYRINGE
INTRAMUSCULAR | Status: AC
  Filled 2020-07-02: qty 5

## 2020-07-02 MED ORDER — GABAPENTIN 300 MG PO CAPS
300.0000 mg | ORAL_CAPSULE | Freq: Two times a day (BID) | ORAL | Status: DC
  Administered 2020-07-02 – 2020-07-03 (×2): 300 mg via ORAL
  Filled 2020-07-02 (×2): qty 1

## 2020-07-02 MED ORDER — PROPOFOL 10 MG/ML IV BOLUS
INTRAVENOUS | Status: AC
  Filled 2020-07-02: qty 20

## 2020-07-02 MED ORDER — ONDANSETRON HCL 4 MG PO TABS: 4.0000 mg | ORAL_TABLET | ORAL | Status: DC | PRN

## 2020-07-02 MED ORDER — MORPHINE SULFATE (PF) 2 MG/ML IV SOLN
2.0000 mg | INTRAVENOUS | Status: DC | PRN
  Administered 2020-07-02: 2 mg via INTRAVENOUS
  Filled 2020-07-02: qty 1

## 2020-07-02 MED ORDER — ONDANSETRON HCL 4 MG/2ML IJ SOLN
INTRAMUSCULAR | Status: AC
  Filled 2020-07-02: qty 2

## 2020-07-02 MED ORDER — CEFAZOLIN SODIUM 1 G IJ SOLR
INTRAMUSCULAR | Status: AC
  Filled 2020-07-02: qty 20

## 2020-07-02 MED ORDER — 0.9 % SODIUM CHLORIDE (POUR BTL) OPTIME
TOPICAL | Status: DC | PRN
  Administered 2020-07-02: 1000 mL

## 2020-07-02 MED ORDER — DEXAMETHASONE SODIUM PHOSPHATE 10 MG/ML IJ SOLN
INTRAMUSCULAR | Status: AC
  Filled 2020-07-02: qty 1

## 2020-07-02 MED ORDER — PROPOFOL 10 MG/ML IV BOLUS
INTRAVENOUS | Status: DC | PRN
  Administered 2020-07-02: 170 mg via INTRAVENOUS

## 2020-07-02 MED ORDER — LIDOCAINE 2% (20 MG/ML) 5 ML SYRINGE
INTRAMUSCULAR | Status: DC | PRN
  Administered 2020-07-02 (×2): 50 mg via INTRAVENOUS

## 2020-07-02 MED ORDER — EPHEDRINE 5 MG/ML INJ
INTRAVENOUS | Status: AC
  Filled 2020-07-02: qty 10

## 2020-07-02 MED ORDER — PHENYLEPHRINE 40 MCG/ML (10ML) SYRINGE FOR IV PUSH (FOR BLOOD PRESSURE SUPPORT)
PREFILLED_SYRINGE | INTRAVENOUS | Status: AC
  Filled 2020-07-02: qty 10

## 2020-07-02 MED ORDER — SUCCINYLCHOLINE CHLORIDE 200 MG/10ML IV SOSY
PREFILLED_SYRINGE | INTRAVENOUS | Status: DC | PRN
  Administered 2020-07-02: 100 mg via INTRAVENOUS

## 2020-07-02 MED ORDER — PHENYLEPHRINE 40 MCG/ML (10ML) SYRINGE FOR IV PUSH (FOR BLOOD PRESSURE SUPPORT)
PREFILLED_SYRINGE | INTRAVENOUS | Status: DC | PRN
  Administered 2020-07-02: 40 ug via INTRAVENOUS
  Administered 2020-07-02: 80 ug via INTRAVENOUS

## 2020-07-02 SURGICAL SUPPLY — 50 items
ADH SKN CLS APL DERMABOND .7 (GAUZE/BANDAGES/DRESSINGS) ×1
ATTRACTOMAT 16X20 MAGNETIC DRP (DRAPES) IMPLANT
BLADE CLIPPER SURG (BLADE) IMPLANT
BLADE SURG 15 STRL LF DISP TIS (BLADE) IMPLANT
BLADE SURG 15 STRL SS (BLADE) ×3
CANISTER SUCT 3000ML PPV (MISCELLANEOUS) ×3 IMPLANT
CLEANER TIP ELECTROSURG 2X2 (MISCELLANEOUS) ×3 IMPLANT
CLIP VESOCCLUDE SM WIDE 24/CT (CLIP) ×2 IMPLANT
CNTNR URN SCR LID CUP LEK RST (MISCELLANEOUS) IMPLANT
CONT SPEC 4OZ STRL OR WHT (MISCELLANEOUS)
CORD BIPOLAR FORCEPS 12FT (ELECTRODE) ×3 IMPLANT
COVER SURGICAL LIGHT HANDLE (MISCELLANEOUS) ×3 IMPLANT
COVER WAND RF STERILE (DRAPES) ×3 IMPLANT
DERMABOND ADVANCED (GAUZE/BANDAGES/DRESSINGS) ×2
DERMABOND ADVANCED .7 DNX12 (GAUZE/BANDAGES/DRESSINGS) ×1 IMPLANT
DRAIN CHANNEL 10F 3/8 F FF (DRAIN) ×3 IMPLANT
DRAPE HALF SHEET 40X57 (DRAPES) ×3 IMPLANT
ELECT COATED BLADE 2.86 ST (ELECTRODE) ×3 IMPLANT
ELECT REM PT RETURN 9FT ADLT (ELECTROSURGICAL) ×3
ELECTRODE REM PT RTRN 9FT ADLT (ELECTROSURGICAL) ×1 IMPLANT
EVACUATOR SILICONE 100CC (DRAIN) ×3 IMPLANT
FORCEPS BIPOLAR SPETZLER 8 1.0 (NEUROSURGERY SUPPLIES) ×3 IMPLANT
GAUZE 4X4 16PLY RFD (DISPOSABLE) ×5 IMPLANT
GLOVE BIO SURGEON STRL SZ 6.5 (GLOVE) ×2 IMPLANT
GLOVE BIO SURGEONS STRL SZ 6.5 (GLOVE) ×1
GLOVE BIOGEL PI IND STRL 6.5 (GLOVE) ×1 IMPLANT
GLOVE BIOGEL PI INDICATOR 6.5 (GLOVE) ×2
GLOVE ECLIPSE 7.5 STRL STRAW (GLOVE) ×3 IMPLANT
GOWN STRL REUS W/ TWL LRG LVL3 (GOWN DISPOSABLE) ×3 IMPLANT
GOWN STRL REUS W/TWL LRG LVL3 (GOWN DISPOSABLE) ×9
HEMOSTAT SURGICEL 2X14 (HEMOSTASIS) IMPLANT
KIT BASIN OR (CUSTOM PROCEDURE TRAY) ×3 IMPLANT
KIT TURNOVER KIT B (KITS) ×3 IMPLANT
NDL HYPO 25GX1X1/2 BEV (NEEDLE) ×1 IMPLANT
NEEDLE HYPO 25GX1X1/2 BEV (NEEDLE) ×3 IMPLANT
NS IRRIG 1000ML POUR BTL (IV SOLUTION) ×3 IMPLANT
PAD ARMBOARD 7.5X6 YLW CONV (MISCELLANEOUS) ×3 IMPLANT
PENCIL SMOKE EVACUATOR (MISCELLANEOUS) ×3 IMPLANT
POSITIONER HEAD DONUT 9IN (MISCELLANEOUS) ×3 IMPLANT
PROBE NERVBE PRASS .33 (MISCELLANEOUS) ×5 IMPLANT
SET WALTER ACTIVATION W/DRAPE (SET/KITS/TRAYS/PACK) ×2 IMPLANT
SHEARS HARMONIC 9CM CVD (BLADE) ×3 IMPLANT
SPONGE INTESTINAL PEANUT (DISPOSABLE) ×3 IMPLANT
SUT ETHILON 2 0 FS 18 (SUTURE) ×3 IMPLANT
SUT SILK 2 0 PERMA HAND 18 BK (SUTURE) ×3 IMPLANT
SUT SILK 3 0 REEL (SUTURE) ×3 IMPLANT
SUT VICRYL 4-0 PS2 18IN ABS (SUTURE) ×3 IMPLANT
TRAY ENT MC OR (CUSTOM PROCEDURE TRAY) ×3 IMPLANT
TRAY FOLEY MTR SLVR 14FR STAT (SET/KITS/TRAYS/PACK) IMPLANT
TUBE ENDOTRAC EMG 8X11.3 (MISCELLANEOUS) ×2 IMPLANT

## 2020-07-02 NOTE — Op Note (Signed)
DATE OF PROCEDURE:  07/02/2020                              OPERATIVE REPORT  SURGEON:  Leta Baptist, MD  PREOPERATIVE DIAGNOSES: 1. Left thyroid mass.   POSTOPERATIVE DIAGNOSES: 1. Left thyroid mass.   PROCEDURE PERFORMED: Completion thyroidectomy.   ANESTHESIA:  General endotracheal tube anesthesia.   COMPLICATIONS:  None.   ESTIMATED BLOOD LOSS: 50 ml   INDICATION FOR PROCEDURE: Linda Reid is a 34 y.o. female with a history of left sided neck discomfort and enlarged left thyroid lobe.  The patient previously underwent right hemithyroidectomy for treatment of her right neck compressive symptoms, with resolution of her right neck symptoms.. The patient would like to have the remaining thyroid lobe removed.  Based on the above findings, the decision was made for the patient to undergo the above stated procedure. Likelihood of success in reducing symptoms was also discussed.  The risks, benefits, alternatives, and details of the procedure were discussed with the patient.  Questions were invited and answered.  Informed consent was obtained.   DESCRIPTION:  The patient was taken to the operating room and placed supine on the operating table.  General endotracheal tube anesthesia was administered by the anesthesiologist.  A nerve monitoring endotracheal tube was used.  The nerve monitoring system was functional throughout the case.   The patient was positioned and prepped and draped in the standard fashion for thyroid surgery.  1% lidocaine with 1 100,000 epinephrine was infiltrated at the planned site of incision.  A transverse lower neck incision was made.  The incision was carried down to the level of the platysma muscles.  Superiorly based and inferiorly based subplatysmal flaps were elevated in the standard fashion.  The strap muscles were divided at midline, and retracted laterally, exposing the thyroid gland.   Careful dissection was performed to free the left thyroid lobe from the  surrounding soft tissue.  The patient was noted to have multiple thyroid nodules.  The left recurrent laryngeal nerve was identified and preserved.  The nerve was noted to be functional throughout the case.  An inferior left parathyroid gland was also identified and preserved.  The entire left thyroid lobe was resected and sent to the pathology department for permanent histologic identification.  A #10 JP drain was placed.  The strap muscles were reapproximated with 4-0 Vicryl sutures.  The incision was closed in layers with 4-0 Vicryl and Dermabond.   The care of the patient was turned over to the anesthesiologist.  The patient was awakened from anesthesia without difficulty.  The patient was extubated and transferred to the recovery room in good condition.   OPERATIVE FINDINGS: Left thyroid lobe with multiple nodules.   SPECIMEN: Left thyroid lobe.   FOLLOWUP CARE:  The patient will be admitted for overnight observation.  [name] 07/02/2020 10:33 AM

## 2020-07-02 NOTE — Transfer of Care (Signed)
Immediate Anesthesia Transfer of Care Note  Patient: Linda Reid  Procedure(s) Performed: COMPLETION THYROIDECTOMY (N/A Neck)  Patient Location: PACU  Anesthesia Type:General  Level of Consciousness: awake and alert   Airway & Oxygen Therapy: Patient Spontanous Breathing and Patient connected to face mask oxygen  Post-op Assessment: Report given to RN, Post -op Vital signs reviewed and stable and Patient moving all extremities  Post vital signs: Reviewed and stable  Last Vitals:  Vitals Value Taken Time  BP 121/100 07/02/20 1040  Temp 36.5 C 07/02/20 1040  Pulse 98 07/02/20 1045  Resp 19 07/02/20 1045  SpO2 98 % 07/02/20 1045  Vitals shown include unvalidated device data.  Last Pain:  Vitals:   07/02/20 0712  TempSrc:   PainSc: 0-No pain         Complications: No complications documented.

## 2020-07-02 NOTE — Anesthesia Procedure Notes (Signed)
Procedure Name: Intubation Date/Time: 07/02/2020 8:42 AM Performed by: Reece Agar, CRNA Pre-anesthesia Checklist: Patient identified, Emergency Drugs available, Suction available and Patient being monitored Patient Re-evaluated:Patient Re-evaluated prior to induction Oxygen Delivery Method: Circle System Utilized Preoxygenation: Pre-oxygenation with 100% oxygen Induction Type: IV induction Ventilation: Mask ventilation without difficulty Laryngoscope Size: Glidescope and 3 Grade View: Grade I Tube type: Oral (NIMS EMG ETT) Tube size: 8.0 mm Number of attempts: 1 Airway Equipment and Method: Stylet Placement Confirmation: ETT inserted through vocal cords under direct vision,  positive ETCO2 and breath sounds checked- equal and bilateral Tube secured with: Tape Dental Injury: Teeth and Oropharynx as per pre-operative assessment

## 2020-07-02 NOTE — H&P (Signed)
Cc: Throat discomfort, enlarged left thyroid lobe  HPI: The patient is a 34 year old female who presents today for evaluation of persistent left sided globus sensation. The patient has noted symptoms for the past few years. She underwent a right hemithyroidectomy in 2018. Thyroid ultrasound done in 08/2018 showed no significant mass on the left. The patient has a history of moderate posterior laryngeal edema, consistent with laryngopharyngeal reflux. She underwent a barium swallow study in the past. The study showed brief sticking of the barium tablet above the aortic arch. No other abnormality was noted. No visible stricture was noted within the esophagus. The patient also had an upper endoscopy in January 2020 with esophageal dilation performed. She noted no improvement after the procedure. The patient is currently on protonix bid and Atrovent nasal spray with persistent symptoms noted. The patient has also noted extreme fatigue and weakness. She feels all of her symptoms are related to her left thyroid lobe because this is how she felt prior to removal of the right thyroid lobe. She was recently seen by her endocrinologist. The patient was diagnosed with fibromyalgia in the past by her neurologist but has never been evaluated by a rheumatologist for autoimmune disease. No other ENT, GI, or respiratory issue noted since the last visit.   Exam: General: Communicates without difficulty, well nourished, acute distress. Head: Normocephalic, no evidence injury, no tenderness, facial buttresses intact without stepoff. Eyes: PERRL, EOMI. No scleral icterus, conjunctivae clear. Ears: External auditory canals clear bilaterally. There is no edema or erythema. Tympanic membrane is within normal limits bilaterally. Nose: Normal skin and external support. Anterior rhinoscopy reveals healthy pink mucosa over the septum and turbinates. No lesions or polyps were seen. Oral cavity: Lips without lesions, oral mucosa moist, no  masses or lesions seen. Pharynx: Clear, no erythema. Neck: Supple, full range of motion, no lymphadenopathy. Salivary: Parotid and submandibular glands without mass. Neck incision well healed. Left thyroid lobe palpable. Neuro:  CN 2-12 grossly intact. Gait normal. Vestibular: No nystagmus at any point of gaze.   Procedure:  Flexible Fiberoptic Laryngoscopy -- Risks, benefits, and alternatives of flexible endoscopy were explained to the patient.  Specific mention was made of the risk of throat numbness with difficulty swallowing, possible bleeding from the nose and mouth, and pain from the procedure.  The patient gave oral consent to proceed.  The nasal cavities were decongested and anesthetised with a combination of oxymetazoline and 4% lidocaine solution.  The flexible scope was inserted into the right nasal cavity and advanced towards the nasopharynx.  Visualized mucosa over the turbinates and septum were as described above.  The nasopharynx was clear.  Oropharyngeal walls were symmetric and mobile without lesion, mass, or edema.  Hypopharynx was also without  lesion or edema.  Larynx was mobile without lesions. Supraglottic structures were free of edema, mass, and asymmetry.  True vocal folds were white without mass or lesion.  Base of tongue was within normal limits.  The patient tolerated the procedure well.   Assessment 1. The patient is noted to have a normal laryngoscopy exam today. No other suspicious mass or lesion is noted.  2. Her current symptoms may be autoimmune mediated. 3. Mildly enlarged left thyroid lobe.  the patient is convinced that her neck discomfort is a result of compression from her left thyroid lobe.  Plan 1. The physical exam and laryngoscopy findings are reviewed with the patient at length. 2. The patient would likely benefit from a rheumatologic evaluation.  3. The possibility  of completion thyroidectomy is discussed extensively, including the risks and benefits. The  patient is advised that the surgery may not help with her current symptom complex.  4. The patient is adamant that she would like to proceed with the procedure. The procedure will be scheduled in accordance with the patient's schedule.

## 2020-07-03 ENCOUNTER — Encounter (HOSPITAL_COMMUNITY): Payer: Self-pay | Admitting: Otolaryngology

## 2020-07-03 DIAGNOSIS — E049 Nontoxic goiter, unspecified: Secondary | ICD-10-CM | POA: Diagnosis not present

## 2020-07-03 LAB — CALCIUM
Calcium: 8.4 mg/dL — ABNORMAL LOW (ref 8.9–10.3)
Calcium: 8.6 mg/dL — ABNORMAL LOW (ref 8.9–10.3)

## 2020-07-03 LAB — SURGICAL PATHOLOGY

## 2020-07-03 MED ORDER — OXYCODONE-ACETAMINOPHEN 5-325 MG PO TABS
1.0000 | ORAL_TABLET | ORAL | 0 refills | Status: AC | PRN
Start: 2020-07-03 — End: 2020-07-06

## 2020-07-03 MED ORDER — LEVOTHYROXINE SODIUM 100 MCG PO TABS: 100.0000 ug | ORAL_TABLET | Freq: Every day | ORAL | 12 refills | Status: AC

## 2020-07-03 MED ORDER — CALCIUM CARBONATE-VITAMIN D 500-200 MG-UNIT PO TABS: 1.0000 | ORAL_TABLET | Freq: Two times a day (BID) | ORAL | 10 refills | Status: DC

## 2020-07-03 NOTE — Anesthesia Postprocedure Evaluation (Signed)
Anesthesia Post Note  Patient: Linda Reid  Procedure(s) Performed: COMPLETION THYROIDECTOMY (N/A Neck)     Patient location during evaluation: PACU Anesthesia Type: General Level of consciousness: awake and alert Pain management: pain level controlled Vital Signs Assessment: post-procedure vital signs reviewed and stable Respiratory status: spontaneous breathing, nonlabored ventilation, respiratory function stable and patient connected to nasal cannula oxygen Cardiovascular status: blood pressure returned to baseline and stable Postop Assessment: no apparent nausea or vomiting Anesthetic complications: no   No complications documented.  Last Vitals:  Vitals:   07/03/20 0957 07/03/20 1329  BP: 123/74 118/77  Pulse: 84 72  Resp: 17 18  Temp: 36.9 C 37.1 C  SpO2: 98% 95%    Last Pain:  Vitals:   07/03/20 1329  TempSrc: Oral  PainSc:                  Catalina Gravel

## 2020-07-03 NOTE — Progress Notes (Signed)
Pt's family here to pick up pt for d/c home. Pt d/c'd via wheelchair with belongings, escorted by unit NT.

## 2020-07-03 NOTE — Progress Notes (Addendum)
Flu shot provided per patient request. JP drain to neck incision has been removed per provider. Site CDI. IV sites removed, WNL, tip intact. AVS reviewed with patient, voiced no questions or concerns. Pt states sister can provide transport home. Primary RN updated that DC instructions given, IV's removed, and flu shot given. Instructed patient to press call light to let staff know when ride is here at main entrance.  Order for Oscal D and Vit D3  on DC med rec, confirmed with discharging physician patient is to take both medications upon discharge.

## 2020-07-03 NOTE — Discharge Summary (Signed)
Physician Discharge Summary  Patient ID: Linda Reid MRN: [id number] DOB/AGE: May 05, 1986 34 y.o.  Admit date: 07/02/2020 Discharge date: 07/03/2020  Admission Diagnoses: Enlarged left thyroid gland  Discharge Diagnoses: Enlarged left thyroid gland Active Problems:   S/P complete thyroidectomy   Discharged Condition: good  Hospital Course: Pt had an uneventful overnight stay. Pt tolerated po well. No bleeding. No stridor. Her calcium level is stable.  Consults: None  Significant Diagnostic Studies: None  Treatments: surgery: Completion thyroidectomy  Discharge Exam: Blood pressure 123/74, pulse 84, temperature 98.5 F (36.9 C), temperature source Oral, resp. rate 17, height 5\' 9"  (1.753 m), weight 85.3 kg, last menstrual period 06/20/2020, SpO2 98 %. General appearance: alert and cooperative Incision/Wound:c/d/i Voice is strong.  Disposition: Discharge disposition: 01-Home or Self Care       Discharge Instructions    Activity as tolerated - No restrictions   Complete by: As directed    Diet general   Complete by: As directed    Increase activity slowly   Complete by: As directed    No wound care   Complete by: As directed      Allergies as of 07/03/2020   No Known Allergies     Medication List    TAKE these medications   calcium-vitamin D 500-200 MG-UNIT tablet Commonly known as: OSCAL WITH D Take 1 tablet by mouth 2 (two) times daily.   cetirizine 10 MG tablet Commonly known as: ZYRTEC Take 10 mg by mouth daily.   cyclobenzaprine 5 MG tablet Commonly known as: FLEXERIL Take 1 tablet (5 mg total) by mouth 3 (three) times daily as needed.   DULoxetine 30 MG capsule Commonly known as: Cymbalta Take 1 capsule (30 mg total) by mouth at bedtime.   gabapentin 300 MG capsule Commonly known as: NEURONTIN Take 300 mg by mouth 2 (two) times daily.   levothyroxine 100 MCG tablet Commonly known as: Synthroid Take 1 tablet (100 mcg total) by  mouth daily before breakfast.   norethindrone 0.35 MG tablet Commonly known as: MICRONOR Take 1 tablet (0.35 mg total) by mouth daily.   oxyCODONE-acetaminophen 5-325 MG tablet Commonly known as: Percocet Take 1 tablet by mouth every 4 (four) hours as needed for up to 3 days for severe pain.   pantoprazole 40 MG tablet Commonly known as: PROTONIX Take 1 tablet (40 mg total) by mouth 2 (two) times daily before a meal.   PROBIOTIC DAILY PO Take 1 capsule by mouth daily.   promethazine 25 MG tablet Commonly known as: PHENERGAN Take 1 tablet (25 mg total) by mouth every 6 (six) hours as needed for nausea or vomiting.   Vitamin D3 125 MCG (5000 UT) Caps Take 1 capsule (5,000 Units total) by mouth daily.       Follow-up Information    Leta Baptist, MD On 07/09/2020.   Specialty: Otolaryngology Why: at 1:20pm Contact information: [place] [address] [phone]               Signed: Burley Saver 07/03/2020, 11:59 AM

## 2020-10-15 ENCOUNTER — Encounter: Payer: Self-pay | Admitting: Neurology

## 2020-10-15 ENCOUNTER — Ambulatory Visit (INDEPENDENT_AMBULATORY_CARE_PROVIDER_SITE_OTHER): Payer: BC Managed Care – PPO | Admitting: Neurology

## 2020-10-15 VITALS — BP 114/83 | HR 70 | Ht 69.0 in | Wt 187.0 lb

## 2020-10-15 DIAGNOSIS — M5441 Lumbago with sciatica, right side: Secondary | ICD-10-CM

## 2020-10-15 DIAGNOSIS — R29898 Other symptoms and signs involving the musculoskeletal system: Secondary | ICD-10-CM | POA: Diagnosis not present

## 2020-10-15 DIAGNOSIS — G4719 Other hypersomnia: Secondary | ICD-10-CM

## 2020-10-15 DIAGNOSIS — M79604 Pain in right leg: Secondary | ICD-10-CM

## 2020-10-15 DIAGNOSIS — M79605 Pain in left leg: Secondary | ICD-10-CM

## 2020-10-15 DIAGNOSIS — G4761 Periodic limb movement disorder: Secondary | ICD-10-CM | POA: Diagnosis not present

## 2020-10-15 DIAGNOSIS — M5442 Lumbago with sciatica, left side: Secondary | ICD-10-CM

## 2020-10-15 DIAGNOSIS — G43009 Migraine without aura, not intractable, without status migrainosus: Secondary | ICD-10-CM

## 2020-10-15 DIAGNOSIS — R2689 Other abnormalities of gait and mobility: Secondary | ICD-10-CM

## 2020-10-15 DIAGNOSIS — G8929 Other chronic pain: Secondary | ICD-10-CM

## 2020-10-15 MED ORDER — UBRELVY 100 MG PO TABS: 100.0000 mg | ORAL_TABLET | ORAL | 8 refills | Status: DC | PRN

## 2020-10-15 MED ORDER — ONDANSETRON 8 MG PO TBDP: 8.0000 mg | ORAL_TABLET | Freq: Three times a day (TID) | ORAL | 6 refills | Status: DC | PRN

## 2020-10-15 NOTE — Progress Notes (Signed)
[facility]    Provider:  Dr Jaynee Eagles Requesting Provider: Glenda Chroman, MD Primary Care Provider:  Glenda Chroman, MD  CC:  Right leg and back pain, headaches/migraines  HPI:  Linda Reid is a 35 y.o. female here as requested by Glenda Chroman, MD for headaches. PMHx chronic pain syndrome, fatigue, depression, anxiety, IBS, hypersomnia, ADHD, hypothyroidism, coronary artery arthrosclerosis, hypertension, low back pain, mixed hyperlipidemia, fibromyalgia, anxiety, insomnia, tinnitus, migraine without aura.  Being referred for headaches and back pain/leg pain.  Migraines: She still has migraines, she had a csf leak and that was identified in 2018 and addressed, but still having headaches/migrianes. She has headaches 1x a month and can last 24-72 hours, can be severe, pulsating/pounding/throbbing, light and sound sensitivity, imitrex did not help too much, she tried maxalt and zomig. she would wait it out. Trued sumatriptan, rizatriptan, she gets a lot of nausea.   Chronic low back pain: She started having low back pain when 7 months pregnant (prior to 2016). She had spinal injections, and had a spinal leak identified and addressed in 2018. Still has the shooting pain down the leg, weakness in the right > left leg. She just lives with it. She feels she is falling apart, the right leg still hurts, she loves Dr Jobe Igo and would prefer him to do the injections due to past history. She feels it is worsening, afecting her mobility. Painful and stiff and weak. She can barely put her pants on sometimes. She has weakness. Low back pain with radiation.All the way down to the foot.   She is extremely tired, she tosses and turns, she wakes up every 2-3 hours, she never feels rested. We will send her for evaluation for sleep study to see if she has PLMS, also she has excessive daytime fatigue could she has OSA?  Reviewed notes, labs and imaging from outside physicians, which  showed:   I reviewed Dr. Sherryl Barters notes: Patient has decreased energy and is sleeping poorly, appetite is decreased, exercises 0 days/week, sleeps an average 9 hours per night, current problems include anxiety depression fatigue and neck pain.  Dr. Emmit Alexanders examination was largely unremarkable including general, skin, HEENT, neck, chest and lung, cardiovascular, abdomen, peripheral vascular and neurologic with the exception of noticeable weakness on the right side.  She is on Cymbalta, gabapentin, promethazine, tremor, head trauma x2 without loss of consciousness   MRI lumbar spine: reviewed images 11/2016  Lumbar spine comparison MRI 02/11/2016  FINDINGS: Segmentation:  Standard  Alignment:  Normal  Vertebrae: No acute compression fracture, discitis-osteomyelitis, facet edema or other focal marrow lesion. No epidural collection.  Conus medullaris: Extends to the L1 level and appears normal.  Paraspinal and other soft tissues: The visualized aorta, IVC and iliac vessels are normal. The visualized retroperitoneal organs and paraspinal soft tissues are normal.  Disc levels:  T12-L1: Normal disc space and facets. No spinal canal or neuroforaminal stenosis.  L1-L2: Normal disc space and facets. No spinal canal or neuroforaminal stenosis.  L2-L3: Normal disc space and facets. No spinal canal or neuroforaminal stenosis.  L3-L4: Normal disc space and facets. No spinal canal or neuroforaminal stenosis.  L4-L5: Normal disc space and facets. No spinal canal or neuroforaminal stenosis.  L5-S1: Mild disc desiccation and bulge without spinal canal stenosis. Endplate spurring contributes to mild bilateral foraminal narrowing.  Visualized sacrum: Normal.  IMPRESSION: 1. Mild disc degeneration at L5-S1 with mild narrowing of both neural exit foramina. 2. No CSF collection. A  small CSF leak likely would not be detected by MRI.  Review of Systems: Patient complains of  symptoms per HPI as well as the following symptoms low back pain. Headaches. Excessive daytime fatigue.  Pertinent negatives and positives per HPI. All others negative.   Social History   Socioeconomic History  . Marital status: Legally Separated    Spouse name: Tyjae Shvartsman   . Number of children: 4  . Years of education: 4 Bachelor  . Highest education level: Not on file  Occupational History  . 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 Smoking status: Never Smoker  . Smokeless tobacco: Never Used  Vaping Use  . Vaping Use: Never used  Substance and Sexual Activity  . Alcohol use: No  . Drug use: No  . Sexual activity: Yes    Birth control/protection: Pill  Other Topics Concern  . Not on file  Social History Narrative   Patient lives with her children.    Her fiance is the father of her current pregnancy, other three children were fathered by her previous partner.    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     Caffeine use: none   Right-handed   Social Determinants of Radio broadcast assistant Strain: Not on file  Food Insecurity: Not on file  Transportation Needs: Not on file  Physical Activity: Not on file  Stress: Not on file  Social Connections: Not on file  Intimate Partner Violence: Not on file    Family History  Problem Relation Age of Onset  . Cancer Maternal Grandmother        breast  . Diabetes Maternal Grandmother   . Hypertension Maternal Grandmother   . Heart disease Maternal Grandfather   . Hypertension Maternal Grandfather   . Hypertension Paternal Grandmother   . Hypertension Paternal Grandfather   . Premature birth Daughter   . Asthma Daughter   . Hypertension Mother   . Parkinson's disease Father   . Parkinson's disease Sister   . Diabetes Maternal Aunt   .  Hypertension Maternal Aunt   . Stroke Maternal Aunt   . Diabetes Maternal Uncle   . Hypertension Maternal Uncle   . Hypertension Paternal Aunt   . Hypertension Paternal Uncle   . Parkinson's disease Cousin   . Colon cancer Neg Hx   . Colon polyps Neg Hx     Past Medical History:  Diagnosis Date  . Anxiety   . Arthritis    lower back, right leg  . Back pain   . Cerebrospinal fluid leak from spinal puncture 03/03/2016   after epidural steroid injection; states has had 2 blood patch procedures   . Difficulty swallowing solids    certain foods  . GERD (gastroesophageal reflux disease)   . Hyperthyroidism   . Migraines   . Nasal congestion 12/06/2016  . Neck pain of over 3 months duration    posterior and right side  . Neuropathy    right leg  . Restless leg syndrome   . Thyroid nodule 11/2016   right  . Tracheal deviation    due to thyroid nodule, per pt.  . Urinary frequency     Patient Active Problem List   Diagnosis Date Noted  . S/P complete thyroidectomy 07/02/2020  . History of lobectomy  of thyroid 05/21/2020  . GERD (gastroesophageal reflux disease) 07/26/2019  . Dysphagia   . Chest pain 04/25/2019  . Abdominal pain 04/18/2019  . Pain of upper abdomen 12/14/2018  . Globus sensation 08/14/2018  . Nausea without vomiting 08/14/2018  . Vitamin D deficiency 06/02/2018  . Chronic pain syndrome 03/16/2018  . Hypokalemia 03/23/2017  . Hypocalcemia 03/23/2017  . Subclinical hyperthyroidism 03/16/2017  . S/P partial thyroidectomy 12/13/2016  . Nexplanon insertion 08/26/2015  . History of preterm delivery 08/26/2015  . History of chlamydia 08/26/2015  . Postpartum/situational depression 08/06/2015  . History of mastitis 07/14/2015  . Groin pain, chronic, right 07/14/2015    Past Surgical History:  Procedure Laterality Date  . DG MYLEOGRAM LUMBAR SPINE ([place] HX)    . DILATION AND CURETTAGE OF UTERUS  06/25/2002  . ESOPHAGEAL MANOMETRY N/A 05/09/2019   normal  relaxation of the EG junction, no significant esophageal peristaltic abnormality detected on study  . ESOPHAGOGASTRODUODENOSCOPY (EGD) WITH PROPOFOL N/A 08/28/2018   mild Schatzki ring s/p dilation, normal stomach and duodenum  . ESOPHAGOGASTRODUODENOSCOPY (EGD) WITH PROPOFOL N/A 12/24/2019    normal esophagus s/p dilation, normal stomach, normal duodenal bulb and second portion of duodenum.   Marland Kitchen MALONEY DILATION N/A 08/28/2018   Procedure: [name] DILATION;  Surgeon: Daneil Dolin, MD;  Location: [facility];  Service: Endoscopy;  Laterality: N/A;  . [name] DILATION N/A 12/24/2019   Procedure: [name] DILATION;  Surgeon: Daneil Dolin, MD;  Location: [facility];  Service: Endoscopy;  Laterality: N/A;  . THYROIDECTOMY Right 12/13/2016   Procedure: RIGHT HEMI-THYROIDECTOMY;  Surgeon: Leta Baptist, MD;  Location: [facility];  Service: ENT;  Laterality: Right;  . THYROIDECTOMY  07/02/2020  . THYROIDECTOMY N/A 07/02/2020   Procedure: COMPLETION THYROIDECTOMY;  Surgeon: Leta Baptist, MD;  Location: [facility];  Service: ENT;  Laterality: N/A;  . TONSILLECTOMY      Current Outpatient Medications  Medication Sig Dispense Refill  . cetirizine (ZYRTEC) 10 MG tablet Take 10 mg by mouth daily.    . Cholecalciferol (VITAMIN D3) 5000 units CAPS Take 1 capsule (5,000 Units total) by mouth daily. 90 capsule 0  . cyclobenzaprine (FLEXERIL) 5 MG tablet Take 1 tablet (5 mg total) by mouth 3 (three) times daily as needed. 30 tablet 0  . DULoxetine (CYMBALTA) 30 MG capsule Take 1 capsule (30 mg total) by mouth at bedtime. 30 capsule 1  . gabapentin (NEURONTIN) 300 MG capsule Take 300 mg by mouth 2 (two) times daily.    . norethindrone (MICRONOR) 0.35 MG tablet Take 1 tablet (0.35 mg total) by mouth daily. 1 Package 11  . ondansetron (ZOFRAN-ODT) 8 MG disintegrating tablet Take 1 tablet (8 mg total) by mouth every 8 (eight) hours as needed for nausea. 30 tablet 6  . pantoprazole (PROTONIX) 40 MG tablet Take 1  tablet (40 mg total) by mouth 2 (two) times daily before a meal. 180 tablet 3  . Probiotic Product (PROBIOTIC DAILY PO) Take 1 capsule by mouth daily.    . promethazine (PHENERGAN) 25 MG tablet Take 1 tablet (25 mg total) by mouth every 6 (six) hours as needed for nausea or vomiting. 30 tablet 3  . Ubrogepant (UBRELVY) 100 MG TABS Take 100 mg by mouth every 2 (two) hours as needed. Maximum 200mg  a day. 16 tablet 8  . calcium-vitamin D (OSCAL WITH D) 500-200 MG-UNIT tablet Take 1 tablet by mouth 2 (two) times daily. 60 tablet 10  . levothyroxine (SYNTHROID) 100 MCG  tablet Take 1 tablet (100 mcg total) by mouth daily before breakfast. 30 tablet 12   Current Facility-Administered Medications  Medication Dose Route Frequency Provider Last Rate Last Admin  . triamcinolone acetonide (KENALOG) 10 MG/ML injection 10 mg  10 mg Other Once Landis Martins, DPM        Allergies as of 10/15/2020  . (No Known Allergies)    Vitals: BP 114/83 (BP Location: Left Arm, Patient Position: Sitting)   Pulse 70   Ht 5\' 9"  (1.753 m)   Wt 187 lb (84.8 kg)   BMI 27.62 kg/m  Last Weight:  Wt Readings from Last 1 Encounters:  10/15/20 187 lb (84.8 kg)   Last Height:   Ht Readings from Last 1 Encounters:  10/15/20 5\' 9"  (1.753 m)     Physical exam: Exam: Gen: NAD, conversant, well nourised, obese, well groomed                     CV: RRR, no MRG. No Carotid Bruits. No peripheral edema, warm, nontender Eyes: Conjunctivae clear without exudates or hemorrhage  Neuro: Detailed Neurologic Exam  Speech:    Speech is normal; fluent and spontaneous with normal comprehension.  Cognition:    The patient is oriented to person, place, and time;     recent and remote memory intact;     language fluent;     normal attention, concentration,     fund of knowledge Cranial Nerves:    The pupils are equal, round, and reactive to light. The fundi are flat.  Visual fields are full to finger confrontation.  Extraocular movements are intact. Trigeminal sensation is intact and the muscles of mastication are normal. The face is symmetric. The palate elevates in the midline. Hearing intact. Voice is normal. Shoulder shrug is normal. The tongue has normal motion without fasciculations.   Coordination:    No dysmetria or ataxia   Gait:    Antalgic (low back pain and right leg pain)  Motor Observation:    No asymmetry, no atrophy, and no involuntary movements noted. Tone:    Normal muscle tone.    Posture:    Posture is normal. normal erect    Strength: right wweakness of hip flexion (due to pain). Weakness right leg flexion 4/5. Left leg flexion 5-/5. Otherwise strength is V/V in the upper and lower limbs.      Sensation: intact to LT     Reflex Exam:  DTR's:    Deep tendon reflexes in the upper and lower extremities are normal bilaterally.   Toes:    The toes are downgoing bilaterally.   Clonus:    Clonus is absent.    Assessment/Plan: 35 year old patient with migraines, chronic low back pain with radicular symptoms in an L5-S1 distribution, excessive daytime fatigue possibly periodic limb movement of sleep.  Episodic migraine: At onset of migraine take Roselyn Meier and can use the zofran 8mg  for nausea.   MRI lumbar spine wo contrast: Chronic history of low back pain with radiation in an L5-S1 distribution. We will repeat MRI of the lumbar spine, there is weakness on exam today, antalgic gait, would like to evaluate for surgical procedures or epidural steroid injections. If we do proceed with epidural injections she would prefer Dr. Jobe Igo, in 2016 she had epidural steroid injections and suffered spinal fluid leak so she would feel safer with Dr. Jobe Igo performing the epidural steroid injections if possible. She has failed multiple prior treatments including prior epidural steroid injections,  muscle relaxers, analgesics, physical therapy.   She is extremely tired, she tosses and turns, she  wakes up every 2-3 hours, she never feels rested, moves her legs constantly all night(PLMS vs RLS). We will send her for evaluation for sleep study to see if she has PLMS, also she has excessive daytime fatigue could she has OSA?     Orders Placed This Encounter  Procedures  . MR LUMBAR SPINE WO CONTRAST  . Ambulatory referral to Sleep Studies   Meds ordered this encounter  Medications  . ondansetron (ZOFRAN-ODT) 8 MG disintegrating tablet    Sig: Take 1 tablet (8 mg total) by mouth every 8 (eight) hours as needed for nausea.    Dispense:  30 tablet    Refill:  6  . Ubrogepant (UBRELVY) 100 MG TABS    Sig: Take 100 mg by mouth every 2 (two) hours as needed. Maximum 200mg  a day.    Dispense:  16 tablet    Refill:  8    Patient has copay card; she can have medication regardless of insurance approval or copay amount.    Cc: Glenda Chroman, MD,  Glenda Chroman, MD  Sarina Ill, MD  [facility] [address] [place], [place] [address]  Phone [phone] Fax [phone]

## 2020-10-15 NOTE — Patient Instructions (Addendum)
Linda Reid: Please take one tablet at the onset of your headache. If it does not improve the symptoms please take one additional tablet. Do not take more then 2 tablets in 24hrs. Do not take use more then 2 to 3 times in a week. Ondansetron MRI lumbar spine Sleep evaluation  Ondansetron oral dissolving tablet What is this medicine? ONDANSETRON (on DAN se tron) is used to treat nausea and vomiting caused by chemotherapy. It is also used to prevent or treat nausea and vomiting after surgery. This medicine may be used for other purposes; ask your health care provider or pharmacist if you have questions. COMMON BRAND NAME(S): Zofran ODT What should I tell my health care provider before I take this medicine? They need to know if you have any of these conditions:  heart disease  history of irregular heartbeat  liver disease  low levels of magnesium or potassium in the blood  an unusual or allergic reaction to ondansetron, granisetron, other medicines, foods, dyes, or preservatives  pregnant or trying to get pregnant  breast-feeding How should I use this medicine? These tablets are made to dissolve in the mouth. Do not try to push the tablet through the foil backing. With dry hands, peel away the foil backing and gently remove the tablet. Place the tablet in the mouth and allow it to dissolve, then swallow. While you may take these tablets with water, it is not necessary to do so. Talk to your pediatrician regarding the use of this medicine in children. Special care may be needed. Overdosage: If you think you have taken too much of this medicine contact a poison control center or emergency room at once. NOTE: This medicine is only for you. Do not share this medicine with others. What if I miss a dose? If you miss a dose, take it as soon as you can. If it is almost time for your next dose, take only that dose. Do not take double or extra doses. What may interact with this medicine? Do not take  this medicine with any of the following medications:  apomorphine  certain medicines for fungal infections like fluconazole, itraconazole, ketoconazole, posaconazole, voriconazole  cisapride  dronedarone  pimozide  thioridazine This medicine may also interact with the following medications:  carbamazepine  certain medicines for depression, anxiety, or psychotic disturbances  fentanyl  linezolid  MAOIs like Carbex, Eldepryl, Marplan, Nardil, and Parnate  methylene blue (injected into a vein)  other medicines that prolong the QT interval (cause an abnormal heart rhythm) like dofetilide, ziprasidone  phenytoin  rifampicin  tramadol This list may not describe all possible interactions. Give your health care provider a list of all the medicines, herbs, non-prescription drugs, or dietary supplements you use. Also tell them if you smoke, drink alcohol, or use illegal drugs. Some items may interact with your medicine. What should I watch for while using this medicine? Check with your doctor or health care professional as soon as you can if you have any sign of an allergic reaction. What side effects may I notice from receiving this medicine? Side effects that you should report to your doctor or health care professional as soon as possible:  allergic reactions like skin rash, itching or hives, swelling of the face, lips, or tongue  breathing problems  confusion  dizziness  fast or irregular heartbeat  feeling faint or lightheaded, falls  fever and chills  loss of balance or coordination  seizures  sweating  swelling of the hands and feet  tightness in the chest  tremors  unusually weak or tired Side effects that usually do not require medical attention (report to your doctor or health care professional if they continue or are bothersome):  constipation or diarrhea  headache This list may not describe all possible side effects. Call your doctor for  medical advice about side effects. You may report side effects to FDA at 1-800-FDA-1088. Where should I keep my medicine? Keep out of the reach of children. Store between 2 and 30 degrees C (36 and 86 degrees F). Throw away any unused medicine after the expiration date. NOTE: This sheet is a summary. It may not cover all possible information. If you have questions about this medicine, talk to your doctor, pharmacist, or health care provider.  2021 Elsevier/Gold Standard (2018-08-01 07:14:10) Ubrogepant tablets What is this medicine? UBROGEPANT (ue BROE je pant) is used to treat migraine headaches with or without aura. An aura is a strange feeling or visual disturbance that warns you of an attack. It is not used to prevent migraines. This medicine may be used for other purposes; ask your health care provider or pharmacist if you have questions. COMMON BRAND NAME(S): Linda Reid What should I tell my health care provider before I take this medicine? They need to know if you have any of these conditions:  kidney disease  liver disease  an unusual or allergic reaction to ubrogepant, other medicines, foods, dyes, or preservatives  pregnant or trying to get pregnant  breast-feeding How should I use this medicine? Take this medicine by mouth with a glass of water. Follow the directions on the prescription label. You can take it with or without food. If it upsets your stomach, take it with food. Take your medicine at regular intervals. Do not take it more often than directed. Do not stop taking except on your doctor's advice. Talk to your pediatrician about the use of this medicine in children. Special care may be needed. Overdosage: If you think you have taken too much of this medicine contact a poison control center or emergency room at once. NOTE: This medicine is only for you. Do not share this medicine with others. What if I miss a dose? This does not apply. This medicine is not for regular  use. What may interact with this medicine? Do not take this medicine with any of the following medicines:  ceritinib  certain antibiotics like chloramphenicol, clarithromycin, telithromycin  certain antivirals for HIV like atazanavir, cobicistat, darunavir, delavirdine, fosamprenavir, indinavir, ritonavir  certain medicines for fungal infections like itraconazole, ketoconazole, posaconazole, voriconazole  conivaptan  grapefruit  idelalisib  mifepristone  nefazodone  ribociclib This medicine may also interact with the following medications:  carvedilol  certain medicines for seizures like phenobarbital, phenytoin  ciprofloxacin  cyclosporine  eltrombopag  fluconazole  fluvoxamine  quinidine  rifampin  St. John's wort  verapamil This list may not describe all possible interactions. Give your health care provider a list of all the medicines, herbs, non-prescription drugs, or dietary supplements you use. Also tell them if you smoke, drink alcohol, or use illegal drugs. Some items may interact with your medicine. What should I watch for while using this medicine? Visit your health care professional for regular checks on your progress. Tell your health care professional if your symptoms do not start to get better or if they get worse. Your mouth may get dry. Chewing sugarless gum or sucking hard candy and drinking plenty of water may help. Contact your health care professional  if the problem does not go away or is severe. What side effects may I notice from receiving this medicine? Side effects that you should report to your doctor or health care professional as soon as possible:  allergic reactions like skin rash, itching or hives; swelling of the face, lips, or tongue Side effects that usually do not require medical attention (report these to your doctor or health care professional if they continue or are bothersome):  drowsiness  dry  mouth  nausea  tiredness This list may not describe all possible side effects. Call your doctor for medical advice about side effects. You may report side effects to FDA at 1-800-FDA-1088. Where should I keep my medicine? Keep out of the reach of children. Store at room temperature between 15 and 30 degrees C (59 and 86 degrees F). Throw away any unused medicine after the expiration date. NOTE: This sheet is a summary. It may not cover all possible information. If you have questions about this medicine, talk to your doctor, pharmacist, or health care provider.  2021 Elsevier/Gold Standard (2018-10-26 08:50:55)

## 2020-10-16 ENCOUNTER — Telehealth: Payer: Self-pay | Admitting: Neurology

## 2020-10-16 NOTE — Telephone Encounter (Signed)
[employer]BS Josem Kaufmann: [id number] (exp. 10/16/20 to 11/14/20)  [facility] pending uplaoded notes on the portal it can take up to 15 busines days to hear back

## 2020-10-20 NOTE — Telephone Encounter (Signed)
I checked the status and it is still pending.

## 2020-10-23 ENCOUNTER — Telehealth: Payer: Self-pay | Admitting: *Deleted

## 2020-10-23 NOTE — Telephone Encounter (Signed)
Attempted to submit the Roselyn Meier PA we received on Cover My Meds but was advised by plan to submit to other primary payer. Will need to look into this.

## 2020-10-27 NOTE — Telephone Encounter (Signed)
Checked the status on the portal and it is pending.

## 2020-10-29 NOTE — Telephone Encounter (Signed)
[facility] states no review needed bc she has [employer] order sent to [facility]  BCBS auth: [id number] (exp. 10/16/20 to 11/14/20)

## 2020-10-30 DIAGNOSIS — G43009 Migraine without aura, not intractable, without status migrainosus: Secondary | ICD-10-CM | POA: Insufficient documentation

## 2020-10-30 NOTE — Telephone Encounter (Addendum)
Received primary pharmacy benefit insurance information from [place].  BIN: [id number] ID: [id number] PCN: AC GROUPRoyetta Crochet Per Dr Jaynee Eagles, Dx code [phone]  I called the patient's primary insurance, [employer], to attempt to complete a PA but I was transferred to [place], [employer], and was told that since primary insurance is rejecting the [place] secondary insurance cannot help.  They will only pay towards the co-pay with the primary pays something. I was told that the patient needs to call the help desk of her primary insurance and further inquire as the primary insurance will not even let us complete a PA on Cover My Meds.    I called the pt and LVM (ok per DPR) explaining to pt that we cannot complete a PA request for Linda Reid with her insurance and that both primary and secondary insurances are rejecting the medication claim. Secondary cannot help if primary rejects. I advised the pt to call member services with [employer], primary insurance, and inquire into this, then give [name] a call back. I left our office number in the message.

## 2020-10-30 NOTE — Telephone Encounter (Deleted)
Spoke with [place] and was given pt's Medicaid insurance info to try to use for PA. BIN: [id number] ID: [id number] PCN: AC GROUPRoyetta Crochet

## 2020-10-31 NOTE — Telephone Encounter (Signed)
CoverMyMeds Einar [place]) called offering my assistance PA for [employer] info: [phone] Reference Key: [id number]

## 2020-11-03 NOTE — Telephone Encounter (Signed)
Waiting to hear back from patient. I have attempted multiple PAs under both insurances and neither will accept it.

## 2020-11-04 ENCOUNTER — Ambulatory Visit: Payer: BC Managed Care – PPO | Admitting: Gastroenterology

## 2020-11-10 NOTE — Telephone Encounter (Signed)
I was able to get in touch with the patient.  She is aware that we were not able to do a prior authorization for her Roselyn Meier and that we were getting bounced between primary and secondary insurance with neither accepting PA.  The patient was advised to call the help desk on the back of her card to inquire as to why this is happening.  I asked her to give [name] a call back after she speaks with insurance.  She stated she would and she verbalized appreciation for the call.

## 2020-11-11 ENCOUNTER — Other Ambulatory Visit: Payer: Self-pay

## 2020-11-11 ENCOUNTER — Ambulatory Visit
Admission: RE | Admit: 2020-11-11 | Discharge: 2020-11-11 | Disposition: A | Discharge status: Home / Self Care | Payer: BC Managed Care – PPO | Source: Ambulatory Visit | Attending: Neurology | Admitting: Neurology

## 2020-11-11 DIAGNOSIS — M5442 Lumbago with sciatica, left side: Secondary | ICD-10-CM

## 2020-11-11 DIAGNOSIS — R2689 Other abnormalities of gait and mobility: Secondary | ICD-10-CM

## 2020-11-11 DIAGNOSIS — M79605 Pain in left leg: Secondary | ICD-10-CM

## 2020-11-11 DIAGNOSIS — M5441 Lumbago with sciatica, right side: Secondary | ICD-10-CM

## 2020-11-11 DIAGNOSIS — M79604 Pain in right leg: Secondary | ICD-10-CM

## 2020-11-11 DIAGNOSIS — G8929 Other chronic pain: Secondary | ICD-10-CM

## 2020-11-11 DIAGNOSIS — R29898 Other symptoms and signs involving the musculoskeletal system: Secondary | ICD-10-CM

## 2020-11-17 ENCOUNTER — Ambulatory Visit (INDEPENDENT_AMBULATORY_CARE_PROVIDER_SITE_OTHER): Payer: BC Managed Care – PPO | Admitting: Neurology

## 2020-11-17 ENCOUNTER — Telehealth: Payer: Self-pay | Admitting: *Deleted

## 2020-11-17 ENCOUNTER — Encounter: Payer: Self-pay | Admitting: Neurology

## 2020-11-17 VITALS — BP 109/77 | HR 79 | Ht 69.0 in | Wt 189.0 lb

## 2020-11-17 DIAGNOSIS — G4719 Other hypersomnia: Secondary | ICD-10-CM | POA: Insufficient documentation

## 2020-11-17 DIAGNOSIS — M79604 Pain in right leg: Secondary | ICD-10-CM | POA: Diagnosis not present

## 2020-11-17 DIAGNOSIS — K219 Gastro-esophageal reflux disease without esophagitis: Secondary | ICD-10-CM | POA: Diagnosis not present

## 2020-11-17 DIAGNOSIS — E890 Postprocedural hypothyroidism: Secondary | ICD-10-CM

## 2020-11-17 DIAGNOSIS — G2581 Restless legs syndrome: Secondary | ICD-10-CM

## 2020-11-17 DIAGNOSIS — K222 Esophageal obstruction: Secondary | ICD-10-CM

## 2020-11-17 DIAGNOSIS — R937 Abnormal findings on diagnostic imaging of other parts of musculoskeletal system: Secondary | ICD-10-CM

## 2020-11-17 DIAGNOSIS — R0683 Snoring: Secondary | ICD-10-CM

## 2020-11-17 NOTE — Patient Instructions (Signed)

## 2020-11-17 NOTE — Progress Notes (Signed)
SLEEP MEDICINE CLINIC    Provider:  Larey Seat, MD  Primary Care Physician:  Glenda Chroman, MD [place] [address]     Referring Provider: Dr. Jaynee Eagles.         Chief Complaint according to patient   Patient presents with:    . New Patient (Initial Visit) Presents for sleep eval. For years she has struggled with problems falling asleep. She states once she gets asleep may sleep 8 hrs but it is broken/fragmented. She wakes up still feeling tired. Never had a SS. C/o waking up with headaches/jaw pain.            HISTORY OF PRESENT ILLNESS:  Linda Reid is a 35 - year -old  [place] American female patient is seen here upon Dr. Cathren Laine  referral on 11/17/2020.  Chief concern according to patient : Linda Reid has been in leg pain and has insomnia and depression for a long time. She reports hypersomnia, migraines. Epworth score 18/ 24 points.   Mr. Linda Reid reports that about 7 months into her pregnancy in 2016 she developed right lower extremity pain.  She describes that this arose from the hip and seem to run below the groin area all the way into her feet.  The cause of the pain was never explained or found.  Her gynecologist referred her first to orthopedic specialist who then gave her pain medications but she did not get better.  On the contrary I think it is led to more anxiety and depression and to some degree to an chronic pain syndrome.  From there she was referred to neurosurgery and neurosurgery had her evaluated by Dr. Luan Pulling a physical and rehab physician visit in the office.  Dr. Luan Pulling apparently did an EMG and nerve conduction study and according to the patient's report she did not flinch while she was tested and he even remarked on her inability to seemingly feel the needle sticks.  She cannot recall that she was ever given a result from these evaluations but she was then referred to Dr. Jaynee Eagles and   Dr. Jaynee Eagles has repeated an EMG nerve conduction study  on the left not on the right lower extremity which was very painful.  She also finally retrieve the results from Dr. Luan Pulling study which were supposedly negative.  Dr. Jaynee Eagles addressed her migraines the patient had a CSF leak which was identified in 2018 and addressed but still has low pressure headaches both with migrainous components-About once a month these can be severe and last up to 3 days.   She gets a lot of nausea this migrainous headaches.    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  She has seen Dr. Mingo Amber who does some of the injections.  It is still affecting her mobility she feels much less pain but also stiffness and weakness.  There is a very radiation from the lower back into the right lower extremity much more than to the left.  In addition she feels extremely tired and sleepy she tosses and turns she has fragmented sleep wakes up every 2-3 hours feels rarely ever rested and refreshed or restored.  There  is a question if she may have periodic limb movements if the leg pain is causing all the sleep fragmentation on baby she could also have obstructive sleep apnea which can be a risk factor for migraine as well.  She had a partial thyroidectomy in 2018, and the remaining left side in 11/ 2021.     Linda Reid  has a past medical history of Anxiety, Arthritis, Back pain, Cerebrospinal fluid leak from spinal puncture (03/03/2016), Difficulty swallowing solids, GERD (gastroesophageal reflux disease), Hyperthyroidism, Migraines, Nasal congestion (12/06/2016), Neck pain of over 3 months duration, Neuropathy, Restless leg syndrome, Thyroid nodule (11/2016), Tracheal deviation, and Urinary  frequency.    Sleep relevant medical history: Nocturia up to 4 times, but she wakes up for other reasons. , Sleep talking, Tonsillectomy in childhood, thyroid ectomy in 2 steps , 2018 and 2021. epidural injections, cervical spine pain after spinal fluid leak- she underwent LP at [facility] and was left with a spinal leak, needing 3 times blood patches.      Family medical /sleep history: Other family member on CPAP with OSA, insomnia, sleep walkers.  sister and father with PD, cousin also, onset in their 78-50s.    Social history: Patient is at [place], she was working as an Tax inspector , now she is working for the [employer] , and lives in a household with 3 older and a 31 year old child-  Legally separated. and a dog.  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 ETOH use ; none , Caffeine intake in form of Coffee( 1-2 cups in morning , not every day ) . Regular exercise in form of walking- she would like to swim. Yoga. Some running.     Sleep habits are as follows: The patient's dinner time is between 5-7 PM.  The patient goes to bed at 10 PM and continues to struggle to sleep for 1-2 hours,  Then sleeps in intervals of 2-3 hours wakes for pain- but results ina  bathroom break.   The preferred sleep position is variable - dicttated by pain.  with the support of 2 pillows.  Dreams are reportedly vivid and lucid. Dreaming in sequences. Repeatedly same dreams. .  6.30  AM is the usual rise time. The patient wakes up with an alarm.  She reports not feeling refreshed or restored in AM, with symptoms such as dry mouth  morning headaches, congestion, , and residual fatigue.  Naps are taken infrequently, she can't take a nap at work- she drives- lasting from 2-10 minutes and are as unrefreshing as nocturnal sleep.    Review of Systems: Out of a complete 14 system review, the patient complains of only the following  symptoms, and all other reviewed systems are negative.:  Fatigue, sleepiness , snoring, fragmented sleep, Insomnia - chronic due to pain and also anxiety   How likely are you to doze in the following situations: 0 = not likely, 1 = slight chance, 2 = moderate chance, 3 = high chance   Sitting and Reading? Watching Television? Sitting inactive in a public place (theater or meeting)? As a passenger in a car for an hour without a break? Lying down in the afternoon when circumstances permit? Sitting and talking to someone? Sitting quietly after lunch without alcohol? In a car, while stopped for a few minutes in traffic?   Total = 18/ 24 points   FSS endorsed at 50/  63 points.   Social History   Socioeconomic History  . Marital status: Legally Separated    Spouse name: Aradhana Gin   . Number of children: 4  . Years of education: 4 Bachelor  . Highest education level: Not on file  Occupational History  . 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 Smoking status: Never Smoker  . Smokeless tobacco: Never Used  Vaping Use  . Vaping Use: Never used  Substance and Sexual Activity  . Alcohol use: No  . Drug use: No  . Sexual activity: Yes    Birth control/protection: Pill  Other Topics Concern  . Not on file  Social History Narrative   Patient lives with her children.    Her fiance is the father of her current pregnancy, other three children were fathered by her previous partner.    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     Caffeine use: none   Right-handed   Social Determinants of Radio broadcast assistant Strain: Not on file  Food Insecurity: Not on file  Transportation Needs: Not on file  Physical Activity: Not on file  Stress: Not on file  Social Connections: Not on file    Family History   Problem Relation Age of Onset  . Cancer Maternal Grandmother        breast  . Diabetes Maternal Grandmother   . Hypertension Maternal Grandmother   . Heart disease Maternal Grandfather   . Hypertension Maternal Grandfather   . Hypertension Paternal Grandmother   . Hypertension Paternal Grandfather   . Premature birth Daughter   . Asthma Daughter   . Hypertension Mother   . Parkinson's disease Father   . Parkinson's disease Sister   . Diabetes Maternal Aunt   . Hypertension Maternal Aunt   . Stroke Maternal Aunt   . Diabetes Maternal Uncle   . Hypertension Maternal Uncle   . Hypertension Paternal Aunt   . Hypertension Paternal Uncle   . Parkinson's disease Cousin   . Colon cancer Neg Hx   . Colon polyps Neg Hx     Past Medical History:  Diagnosis Date  . Anxiety   . Arthritis    lower back, right leg  . Back pain   . Cerebrospinal fluid leak from spinal puncture 03/03/2016   after epidural steroid injection; states has had 2 blood patch procedures   . Difficulty swallowing solids    certain foods  . GERD (gastroesophageal reflux disease)   . Hyperthyroidism   . Migraines   . Nasal congestion 12/06/2016  . Neck pain of over 3 months duration    posterior and right side  . Neuropathy    right leg  . Restless leg syndrome   . Thyroid nodule 11/2016   right  . Tracheal deviation    due to thyroid nodule, per pt.  . Urinary frequency     Past Surgical History:  Procedure Laterality Date  . DG MYLEOGRAM LUMBAR SPINE ([place] [facility])    . DILATION AND CURETTAGE OF UTERUS  06/25/2002  . ESOPHAGEAL MANOMETRY N/A 05/09/2019   normal relaxation of the EG junction, no significant esophageal peristaltic abnormality detected on study  . ESOPHAGOGASTRODUODENOSCOPY (EGD) WITH PROPOFOL N/A 08/28/2018   mild Schatzki ring s/p dilation, normal stomach and duodenum  .  ESOPHAGOGASTRODUODENOSCOPY (EGD) WITH PROPOFOL N/A 12/24/2019    normal esophagus s/p dilation, normal stomach, normal  duodenal bulb and second portion of duodenum.   Marland Kitchen MALONEY DILATION N/A 08/28/2018   Procedure: [name] DILATION;  Surgeon: Daneil Dolin, MD;  [facility];  Service: Endoscopy;  Laterality: N/A;  . [name] DILATION N/A 12/24/2019   Procedure: [name] DILATION;  Surgeon: Daneil Dolin, MD;  Location: [facility];  Service: Endoscopy;  Laterality: N/A;  . THYROIDECTOMY Right 12/13/2016   Procedure: RIGHT HEMI-THYROIDECTOMY;  Surgeon: Leta Baptist, MD;  Location: [facility];  Service: ENT;  Laterality: Right;  . THYROIDECTOMY  07/02/2020  . THYROIDECTOMY N/A 07/02/2020   Procedure: COMPLETION THYROIDECTOMY;  Surgeon: Leta Baptist, MD;  Location: [facility];  Service: ENT;  Laterality: N/A;  . TONSILLECTOMY       Current Outpatient Medications on File Prior to Visit  Medication Sig Dispense Refill  . calcium-vitamin D (OSCAL WITH D) 500-200 MG-UNIT tablet Take 1 tablet by mouth 2 (two) times daily. 60 tablet 10  . cetirizine (ZYRTEC) 10 MG tablet Take 10 mg by mouth daily.    . Cholecalciferol (VITAMIN D3) 5000 units CAPS Take 1 capsule (5,000 Units total) by mouth daily. 90 capsule 0  . cyclobenzaprine (FLEXERIL) 5 MG tablet Take 1 tablet (5 mg total) by mouth 3 (three) times daily as needed. 30 tablet 0  . DULoxetine (CYMBALTA) 30 MG capsule Take 1 capsule (30 mg total) by mouth at bedtime. 30 capsule 1  . gabapentin (NEURONTIN) 300 MG capsule Take 300 mg by mouth 2 (two) times daily.    Marland Kitchen levothyroxine (SYNTHROID) 100 MCG tablet Take 1 tablet (100 mcg total) by mouth daily before breakfast. 30 tablet 12  . norethindrone (MICRONOR) 0.35 MG tablet Take 1 tablet (0.35 mg total) by mouth daily. 1 Package 11  . ondansetron (ZOFRAN-ODT) 8 MG disintegrating tablet Take 1 tablet (8 mg total) by mouth every 8 (eight) hours as needed for nausea. 30 tablet 6  . pantoprazole (PROTONIX) 40 MG tablet Take 1 tablet (40 mg total) by mouth 2 (two) times daily before a meal. 180 tablet 3  .  Probiotic Product (PROBIOTIC DAILY PO) Take 1 capsule by mouth daily.    . promethazine (PHENERGAN) 25 MG tablet Take 1 tablet (25 mg total) by mouth every 6 (six) hours as needed for nausea or vomiting. 30 tablet 3  . Ubrogepant (UBRELVY) 100 MG TABS Take 100 mg by mouth every 2 (two) hours as needed. Maximum 200mg  a day. 16 tablet 8   Current Facility-Administered Medications on File Prior to Visit  Medication Dose Route Frequency Provider Last Rate Last Admin  . triamcinolone acetonide (KENALOG) 10 MG/ML injection 10 mg  10 mg Other Once Landis Martins, DPM        No Known Allergies  Physical exam:  Today's Vitals   11/17/20 1256  BP: 109/77  Pulse: 79  Weight: 189 lb (85.7 kg)  Height: 5\' 9"  (1.753 m)   Body mass index is 27.91 kg/m.   Wt Readings from Last 3 Encounters:  11/17/20 189 lb (85.7 kg)  10/15/20 187 lb (84.8 kg)  07/02/20 188 lb 0.8 oz (85.3 kg)     Ht Readings from Last 3 Encounters:  11/17/20 5\' 9"  (1.753 m)  10/15/20 5\' 9"  (1.753 m)  07/02/20 5\' 9"  (1.753 m)      General: The patient is awake, alert and appears not in acute distress. The patient is  well groomed. Head: Normocephalic, atraumatic. Neck is supple. Mallampati 3,  neck circumference:  inches . Nasal airflow is patent.  Retrognathia is seen.  Dental status: intact  Cardiovascular:  Regular rate and cardiac rhythm by pulse,  without distended neck veins. Respiratory: Lungs are clear to auscultation.  Skin:  Without evidence of ankle edema, or rash. Trunk: sitting erect.   Neurologic exam : The patient is awake and alert, oriented to place and time.   Memory subjective described as intact.  Attention span & concentration ability appears normal.  Speech is fluent,  without  dysarthria, dysphonia or aphasia.  Mood and affect are appropriate.   Cranial nerves: no loss of smell or taste reported  Pupils are equal and briskly reactive to light. Funduscopic exam deferred. .  Extraocular  movements in vertical and horizontal planes were intact and without nystagmus. No Diplopia. Visual fields by finger perimetry are intact. Hearing was intact to soft voice- Left ear tinnitus and a slight decrease in hearing.  Facial sensation intact to fine touch. Facial motor strength is symmetric and tongue and uvula move midline.  Neck ROM : rotation, tilt and flexion extension were normal for age and shoulder shrug was symmetrical.    Motor exam:  Symmetric bulk, tone and ROM.   Normal tone without cog-wheeling, asymmetric grip strength . She didn't close all 5 fingers around my fingers on the right.    Sensory:  Fine touch and vibration were  felt less in the left ankle, left foot.  Proprioception tested in the upper extremities was normal.   Coordination: Rapid alternating movements in the fingers/hands were of normal speed.  The Finger-to-nose maneuver was intact without evidence of ataxia, dysmetria and mild low amplitude tremor.   Gait and station: Patient could rise unassisted from a seated position, walked without assistive device.  Stance is of normal width/ base and the patient turned with 3 steps.  Toe and heel walk were deferred.  Deep tendon reflexes: in the  upper and lower extremities are symmetric and intact.  Babinski response was deferred .       After spending a total time of 45 minutes face to face and additional time for physical and neurologic examination, review of laboratory studies,  personal review of imaging studies, reports and results of other testing and review of referral information / records as far as provided in visit, I have established the following assessments:  1)  I am happy to address the excessive daytime sleepiness and look for any insomnia that is not elated to chroinc pain or anxiety, screening for OSA.  2)  Diffuse pain and multiple locations , abdomen , lower extremities, neck and low back.  RLS reported.  3) headaches and jaw pain related  to bruxism ? Will ask sleep tech to make special notes.    My Plan is to proceed with:  1) technician to comment on bruxism if present.  2) PLM possible, I will order a sleep study for OSA screening and for leg pain.    I would like to thank  Glenda Chroman, Md [address] [place],  [place] [address] for allowing me to meet with and to take care of this pleasant patient.   In short, Linda Reid is presenting with EDS, PLMs, RLS  and leg pain interrupting her sleep.     CC: I will share my notes with Dr Jaynee Eagles.   Electronically signed by: Larey Seat, MD 11/17/2020 1:07 PM  [facility] [place] certified by [employer] of Sleep Medicine and Diplomate of the [employer] of Sleep Medicine. Board certified In Neurology through the [place], Fellow of the [employer] of Neurology. Medical Director of [employer].

## 2020-11-17 NOTE — Telephone Encounter (Signed)
Spoke with patient who had read Dr Cathren Laine my chart message. She would like to see [place] neurosurgery, asked for referral. I advised we will send that in. Patient verbalized understanding, appreciation.

## 2020-11-20 ENCOUNTER — Telehealth: Payer: Self-pay

## 2020-11-20 DIAGNOSIS — E876 Hypokalemia: Secondary | ICD-10-CM

## 2020-11-20 DIAGNOSIS — Z9889 Other specified postprocedural states: Secondary | ICD-10-CM

## 2020-11-20 DIAGNOSIS — G4719 Other hypersomnia: Secondary | ICD-10-CM

## 2020-11-20 DIAGNOSIS — G43009 Migraine without aura, not intractable, without status migrainosus: Secondary | ICD-10-CM

## 2020-11-20 DIAGNOSIS — G2581 Restless legs syndrome: Secondary | ICD-10-CM

## 2020-11-20 DIAGNOSIS — E890 Postprocedural hypothyroidism: Secondary | ICD-10-CM

## 2020-11-20 NOTE — Telephone Encounter (Signed)
Insurance has denied the in lab sleep study request. However, insurance has authorized for the HST. Can I get an order for the HST, please. Thank you!!

## 2020-11-20 NOTE — Telephone Encounter (Signed)
LVM for pt to call me back to schedule sleep study  

## 2020-11-20 NOTE — Addendum Note (Signed)
Addended by: Larey Seat on: 11/20/2020 12:37 PM   Modules accepted: Orders

## 2021-02-12 ENCOUNTER — Ambulatory Visit: Payer: BC Managed Care – PPO | Admitting: Neurology

## 2021-03-18 ENCOUNTER — Ambulatory Visit (INDEPENDENT_AMBULATORY_CARE_PROVIDER_SITE_OTHER): Payer: BC Managed Care – PPO | Admitting: Gastroenterology

## 2021-03-18 ENCOUNTER — Encounter: Payer: Self-pay | Admitting: Gastroenterology

## 2021-03-18 ENCOUNTER — Other Ambulatory Visit: Payer: Self-pay

## 2021-03-18 VITALS — BP 120/71 | HR 81 | Temp 97.8°F | Ht 69.0 in | Wt 196.2 lb

## 2021-03-18 DIAGNOSIS — K219 Gastro-esophageal reflux disease without esophagitis: Secondary | ICD-10-CM | POA: Diagnosis not present

## 2021-03-18 DIAGNOSIS — R0989 Other specified symptoms and signs involving the circulatory and respiratory systems: Secondary | ICD-10-CM

## 2021-03-18 DIAGNOSIS — R198 Other specified symptoms and signs involving the digestive system and abdomen: Secondary | ICD-10-CM | POA: Diagnosis not present

## 2021-03-18 NOTE — Progress Notes (Signed)
Referring Provider: Glenda Chroman, MD Primary Care Physician:  Glenda Chroman, MD Primary GI: Dr. Gala Romney   Chief Complaint  Patient presents with   Nausea    During day but mostly at night   Dysphagia   Gastroesophageal Reflux    Has occ vomiting with episodes    HPI:   Linda Reid is a 35 y.o. female presenting today with a history of globus sensation (chronic, normal manometry), dysphagia, abdominal pain, EGD May 2021 with normal esophagus s/p dilation, normal stomach, normal duodenal bulb and second portion of duodenum. Chronic nausea with GES normal. Chronic globus sensation. ENT on board. Recent completion thyroidectomy. Symptoms persist.   Multiple non-GI concerns today. Feels foggy at times. Fatigued. Right side of body is weak. Pain in back of legs bilaterally. Feet feel puffy. Seeing PCP today.   Chronic nausea. Phenergan helps at night. Zofran during the day.   Feels globus sensation. Few recent episodes of GERD eating [place] food and BBQ. Concerned regarding persistent globus.   Past Medical History:  Diagnosis Date   Anxiety    Arthritis    lower back, right leg   Back pain    Cerebrospinal fluid leak from spinal puncture 03/03/2016   after epidural steroid injection; states has had 2 blood patch procedures    Difficulty swallowing solids    certain foods   GERD (gastroesophageal reflux disease)    Hyperthyroidism    Migraines    Nasal congestion 12/06/2016   Neck pain of over 3 months duration    posterior and right side   Neuropathy    right leg   Restless leg syndrome    Thyroid nodule 11/2016   right   Tracheal deviation    due to thyroid nodule, per pt.   Urinary frequency     Past Surgical History:  Procedure Laterality Date   DG MYLEOGRAM LUMBAR SPINE ([place] HX)     DILATION AND CURETTAGE OF UTERUS  06/25/2002   ESOPHAGEAL MANOMETRY N/A 05/09/2019   normal relaxation of the EG junction, no significant esophageal peristaltic  abnormality detected on study   ESOPHAGOGASTRODUODENOSCOPY (EGD) WITH PROPOFOL N/A 08/28/2018   mild Schatzki ring s/p dilation, normal stomach and duodenum   ESOPHAGOGASTRODUODENOSCOPY (EGD) WITH PROPOFOL N/A 12/24/2019    normal esophagus s/p dilation, normal stomach, normal duodenal bulb and second portion of duodenum.    MALONEY DILATION N/A 08/28/2018   Procedure: [name] DILATION;  Surgeon: Daneil Dolin, MD;  Location: [facility];  Service: Endoscopy;  Laterality: N/A;   MALONEY DILATION N/A 12/24/2019   Procedure: [name] DILATION;  Surgeon: Daneil Dolin, MD;  Location: [facility];  Service: Endoscopy;  Laterality: N/A;   THYROIDECTOMY Right 12/13/2016   Procedure: RIGHT HEMI-THYROIDECTOMY;  Surgeon: Leta Baptist, MD;  Location: [facility];  Service: ENT;  Laterality: Right;   THYROIDECTOMY  07/02/2020   THYROIDECTOMY N/A 07/02/2020   Procedure: COMPLETION THYROIDECTOMY;  Surgeon: Leta Baptist, MD;  Location: [facility];  Service: ENT;  Laterality: N/A;   TONSILLECTOMY      Current Outpatient Medications  Medication Sig Dispense Refill   calcium-vitamin D (OSCAL WITH D) 500-200 MG-UNIT tablet Take 1 tablet by mouth 2 (two) times daily. 60 tablet 10   cetirizine (ZYRTEC) 10 MG tablet Take 10 mg by mouth daily.     Cholecalciferol (VITAMIN D3) 5000 units CAPS Take 1 capsule (5,000 Units total) by mouth daily. 90 capsule 0   cyclobenzaprine (FLEXERIL)  5 MG tablet Take 1 tablet (5 mg total) by mouth 3 (three) times daily as needed. 30 tablet 0   DULoxetine (CYMBALTA) 30 MG capsule Take 1 capsule (30 mg total) by mouth at bedtime. 30 capsule 1   gabapentin (NEURONTIN) 300 MG capsule Take 300 mg by mouth 2 (two) times daily.     levothyroxine (SYNTHROID) 100 MCG tablet Take 1 tablet (100 mcg total) by mouth daily before breakfast. 30 tablet 12   norethindrone (MICRONOR) 0.35 MG tablet Take 1 tablet (0.35 mg total) by mouth daily. 1 Package 11   ondansetron (ZOFRAN-ODT) 8 MG  disintegrating tablet Take 1 tablet (8 mg total) by mouth every 8 (eight) hours as needed for nausea. 30 tablet 6   pantoprazole (PROTONIX) 40 MG tablet Take 1 tablet (40 mg total) by mouth 2 (two) times daily before a meal. 180 tablet 3   Probiotic Product (PROBIOTIC DAILY PO) Take 1 capsule by mouth daily.     promethazine (PHENERGAN) 25 MG tablet Take 1 tablet (25 mg total) by mouth every 6 (six) hours as needed for nausea or vomiting. 30 tablet 3   Ubrogepant (UBRELVY) 100 MG TABS Take 100 mg by mouth every 2 (two) hours as needed. Maximum '200mg'$  a day. (Patient not taking: Reported on 03/18/2021) 16 tablet 8   Current Facility-Administered Medications  Medication Dose Route Frequency Provider Last Rate Last Admin   triamcinolone acetonide (KENALOG) 10 MG/ML injection 10 mg  10 mg Other Once Landis Martins, DPM        Allergies as of 03/18/2021   (No Known Allergies)    Family History  Problem Relation Age of Onset   Cancer Maternal Grandmother        breast   Diabetes Maternal Grandmother    Hypertension Maternal Grandmother    Heart disease Maternal Grandfather    Hypertension Maternal Grandfather    Hypertension Paternal Grandmother    Hypertension Paternal Grandfather    Premature birth Daughter    Asthma Daughter    Hypertension Mother    Parkinson's disease Father    Parkinson's disease Sister    Diabetes Maternal Aunt    Hypertension Maternal Aunt    Stroke Maternal Aunt    Diabetes Maternal Uncle    Hypertension Maternal Uncle    Hypertension Paternal Aunt    Hypertension Paternal Uncle    Parkinson's disease Cousin    Colon cancer Neg Hx    Colon polyps Neg Hx     Social History   Socioeconomic History   Marital status: Legally Separated    Spouse name: Psychiatrist    Number of children: 4   Years of education: 4 Bachelor   Highest education level: Not on file  Occupational History   Occupation: Print production planner: [employer]     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    Her fiance is the father of her current pregnancy, other three children were fathered by her previous partner.    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     Caffeine use: none   Right-handed   Social Determinants of Radio broadcast assistant Strain: Not on file  Food Insecurity: Not on file  Transportation Needs: Not on file  Physical Activity: Not on file  Stress: Not on file  Social Connections: Not on file    Review of Systems: Gen: Denies fever, chills, anorexia. Denies fatigue, weakness, weight loss.  CV: Denies chest pain, palpitations, syncope, peripheral edema, and claudication. Resp: Denies dyspnea at rest, cough, wheezing, coughing up blood, and pleurisy. GI: see HPI Derm: Denies rash, itching, dry skin Psych: Denies depression, anxiety, memory loss, confusion. No homicidal or suicidal ideation.  Heme: Denies bruising, bleeding, and enlarged lymph nodes.  Physical Exam: BP 120/71   Pulse 81   Temp 97.8 F (36.6 C) (Temporal)   Ht '5\' 9"'$  (1.753 m)   Wt 196 lb 3.2 oz (89 kg)   LMP 02/03/2021 (Approximate)   BMI 28.97 kg/m  General:   Alert and oriented. No distress noted. Pleasant and cooperative.  Head:  Normocephalic and atraumatic. Eyes:  Conjuctiva clear without scleral icterus. Mouth:  mask in place Abdomen:  +BS, soft, non-tender and non-distended. No rebound or guarding. No HSM or masses noted. Msk:  Symmetrical without gross deformities. Normal posture. Extremities:  Without  edema. Neurologic:  Alert and  oriented x4 Psych:  Alert and cooperative. Normal mood and affect.  ASSESSMENT/PLAN: Linda Reid is a 34 y.o. female presenting today with a history of chronic globus sensation (normal manometry), dysphagia, abdominal pain, EGD May 2021 with normal esophagus s/p dilation, normal stomach, normal duodenal bulb and second portion of duodenum. Chronic nausea with GES normal. ENT on board. Recent completion thyroidectomy. Symptoms persist.   Globus sensation: I query if this is all related to uncontrolled GERD. She is on Protonix BID. ENT evaluation has been completed. Completion thyroidectomy recently. Manometry unrevealing. EGD recently May 2021. This has been a chronic and persistent issue. Referring to [facility] for further evaluation.   Chronic nausea: GES normal. No alarm signs/symptoms. No weight loss. Good appetite. Continue phenergan prn at night and Zofran during the day. Continue PPI BID.   Referring to [facility], as we have maximized evaluation here. Return in 6-8 months.  Annitta Needs, PhD, ANP-BC [facility]

## 2021-03-18 NOTE — Patient Instructions (Signed)
I am referring you to [facility] for further evaluation.   We will see you in 6-8 months!  Have a great rest of the summer!  I enjoyed seeing you again today! As you know, I value our relationship and want to provide genuine, compassionate, and quality care. I welcome your feedback. If you receive a survey regarding your visit,  I greatly appreciate you taking time to fill this out. See you next time!  Annitta Needs, PhD, ANP-BC [facility]

## 2021-03-23 ENCOUNTER — Other Ambulatory Visit: Payer: Self-pay | Admitting: *Deleted

## 2021-03-23 DIAGNOSIS — R0989 Other specified symptoms and signs involving the circulatory and respiratory systems: Secondary | ICD-10-CM

## 2021-03-23 DIAGNOSIS — R198 Other specified symptoms and signs involving the digestive system and abdomen: Secondary | ICD-10-CM

## 2021-05-14 ENCOUNTER — Telehealth: Payer: Self-pay | Admitting: *Deleted

## 2021-05-14 NOTE — Telephone Encounter (Signed)
Called [facility] and was advised patient has been added to recall list as they have no available appts at this time. Once an appt is available they will call pt to schedule.

## 2021-06-18 ENCOUNTER — Encounter: Payer: Self-pay | Admitting: Neurology

## 2021-06-18 ENCOUNTER — Ambulatory Visit: Payer: Medicaid Other | Admitting: Neurology

## 2021-06-18 NOTE — Progress Notes (Deleted)
[facility]    Provider:  Dr Jaynee Eagles Requesting Provider: Glenda Chroman, MD Primary Care Provider:  Glenda Chroman, MD  CC:  Right leg and back pain, headaches/migraines  06/18/2021: Here for follow up. For her hypersomnolence she saw our sleep doctor who offered a sleep test but doesn't appear that happened. She was sent to Neurosurgery for MRI lumbar spine results. She was treated for episodic migraine with Roselyn Meier and can use the zofran 8mg  for nausea.   MRI Lumbar Spine 11/11/2020:  MRI lumbar spine without contrast demonstrating: - At L5-S1: disc bulging and facet hypertrophy with mild-moderate biforaminal stenosis. - At L4-5: disc bulging with mild biforaminal stenosis.  HPI:  Devani Odonnel is a 35 y.o. female here as requested by Glenda Chroman, MD for headaches. PMHx chronic pain syndrome, fatigue, depression, anxiety, IBS, hypersomnia, ADHD, hypothyroidism, coronary artery arthrosclerosis, hypertension, low back pain, mixed hyperlipidemia, fibromyalgia, anxiety, insomnia, tinnitus, migraine without aura.  Being referred for headaches and back pain/leg pain.  Migraines: She still has migraines, she had a csf leak and that was identified in 2018 and addressed, but still having headaches/migrianes. She has headaches 1x a month and can last 24-72 hours, can be severe, pulsating/pounding/throbbing, light and sound sensitivity, imitrex did not help too much, she tried maxalt and zomig. she would wait it out. Trued sumatriptan, rizatriptan, she gets a lot of nausea.   Chronic low back pain: She started having low back pain when 7 months pregnant (prior to 2016). She had spinal injections, and had a spinal leak identified and addressed in 2018. Still has the shooting pain down the leg, weakness in the right > left leg. She just lives with it. She feels she is falling apart, the right leg still hurts, she loves Dr Jobe Igo and would prefer him to do the injections due to  past history. She feels it is worsening, afecting her mobility. Painful and stiff and weak. She can barely put her pants on sometimes. She has weakness. Low back pain with radiation.All the way down to the foot.   She is extremely tired, she tosses and turns, she wakes up every 2-3 hours, she never feels rested. We will send her for evaluation for sleep study to see if she has PLMS, also she has excessive daytime fatigue could she has OSA?  Reviewed notes, labs and imaging from outside physicians, which showed:   I reviewed Dr. Sherryl Barters notes: Patient has decreased energy and is sleeping poorly, appetite is decreased, exercises 0 days/week, sleeps an average 9 hours per night, current problems include anxiety depression fatigue and neck pain.  Dr. Emmit Alexanders examination was largely unremarkable including general, skin, HEENT, neck, chest and lung, cardiovascular, abdomen, peripheral vascular and neurologic with the exception of noticeable weakness on the right side.  She is on Cymbalta, gabapentin, promethazine, tremor, head trauma x2 without loss of consciousness   MRI lumbar spine: reviewed images 11/2016  Lumbar spine comparison MRI 02/11/2016   FINDINGS: Segmentation:  Standard   Alignment:  Normal   Vertebrae: No acute compression fracture, discitis-osteomyelitis, facet edema or other focal marrow lesion. No epidural collection.   Conus medullaris: Extends to the L1 level and appears normal.   Paraspinal and other soft tissues: The visualized aorta, IVC and iliac vessels are normal. The visualized retroperitoneal organs and paraspinal soft tissues are normal.   Disc levels:   T12-L1: Normal disc space and facets. No spinal canal or neuroforaminal stenosis.   L1-L2: Normal disc  space and facets. No spinal canal or neuroforaminal stenosis.   L2-L3: Normal disc space and facets. No spinal canal or neuroforaminal stenosis.   L3-L4: Normal disc space and facets. No spinal canal  or neuroforaminal stenosis.   L4-L5: Normal disc space and facets. No spinal canal or neuroforaminal stenosis.   L5-S1: Mild disc desiccation and bulge without spinal canal stenosis. Endplate spurring contributes to mild bilateral foraminal narrowing.   Visualized sacrum: Normal.   IMPRESSION: 1. Mild disc degeneration at L5-S1 with mild narrowing of both neural exit foramina. 2. No CSF collection. A small CSF leak likely would not be detected by MRI.  Review of Systems: Patient complains of symptoms per HPI as well as the following symptoms low back pain. Headaches. Excessive daytime fatigue.  Pertinent negatives and positives per HPI. All others negative.   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    Her fiance is the father of her current pregnancy, other three children were fathered by her previous partner.    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     Caffeine use: none   Right-handed   Social Determinants of Radio broadcast assistant Strain: Not on file  Food Insecurity: Not on file  Transportation Needs: Not on file  Physical Activity: Not on file  Stress: Not on file  Social Connections: Not on file  Intimate Partner Violence: Not on file    Family  History  Problem Relation Age of Onset   Cancer Maternal Grandmother        breast   Diabetes Maternal Grandmother    Hypertension Maternal Grandmother    Heart disease Maternal Grandfather    Hypertension Maternal Grandfather    Hypertension Paternal Grandmother    Hypertension Paternal Grandfather    Premature birth Daughter    Asthma Daughter    Hypertension Mother    Parkinson's disease Father    Parkinson's disease Sister    Diabetes Maternal Aunt    Hypertension Maternal Aunt    Stroke Maternal Aunt    Diabetes Maternal Uncle    Hypertension Maternal Uncle    Hypertension Paternal Aunt    Hypertension Paternal Uncle    Parkinson's disease Cousin    Colon cancer Neg Hx    Colon polyps Neg Hx     Past Medical History:  Diagnosis Date   Anxiety    Arthritis    lower back, right leg   Back pain    Cerebrospinal fluid leak from spinal puncture 03/03/2016   after epidural steroid injection; states has had 2 blood patch procedures    Difficulty swallowing solids    certain foods   GERD (  gastroesophageal reflux disease)    Hyperthyroidism    Migraines    Nasal congestion 12/06/2016   Neck pain of over 3 months duration    posterior and right side   Neuropathy    right leg   Restless leg syndrome    Thyroid nodule 11/2016   right   Tracheal deviation    due to thyroid nodule, per pt.   Urinary frequency     Patient Active Problem List   Diagnosis Date Noted   Snoring 11/17/2020   RLS (restless legs syndrome) 11/17/2020   Leg pain, central, right 11/17/2020   GERD with stricture 11/17/2020   Excessive daytime sleepiness 11/17/2020   H/O total thyroidectomy 11/17/2020   Migraine without aura and without status migrainosus, not intractable 10/30/2020   S/P complete thyroidectomy 07/02/2020   History of lobectomy of thyroid 05/21/2020   GERD (gastroesophageal reflux disease) 07/26/2019   Dysphagia    Chest pain 04/25/2019   Abdominal pain 04/18/2019   Pain  of upper abdomen 12/14/2018   Globus sensation 08/14/2018   Nausea without vomiting 08/14/2018   Vitamin D deficiency 06/02/2018   Chronic pain syndrome 03/16/2018   Hypokalemia 03/23/2017   Hypocalcemia 03/23/2017   Subclinical hyperthyroidism 03/16/2017   S/P partial thyroidectomy 12/13/2016   Nexplanon insertion 08/26/2015   History of preterm delivery 08/26/2015   History of chlamydia 08/26/2015   Postpartum/situational depression 08/06/2015   History of mastitis 07/14/2015   Groin pain, chronic, right 07/14/2015    Past Surgical History:  Procedure Laterality Date   [facility] [place] ([place] HX)     DILATION AND CURETTAGE OF UTERUS  06/25/2002   ESOPHAGEAL MANOMETRY N/A 05/09/2019   normal relaxation of the EG junction, no significant esophageal peristaltic abnormality detected on study   ESOPHAGOGASTRODUODENOSCOPY (EGD) WITH PROPOFOL N/A 08/28/2018   mild Schatzki ring s/p dilation, normal stomach and duodenum   ESOPHAGOGASTRODUODENOSCOPY (EGD) WITH PROPOFOL N/A 12/24/2019    normal esophagus s/p dilation, normal stomach, normal duodenal bulb and second portion of duodenum.    MALONEY DILATION N/A 08/28/2018   Procedure: [name] DILATION;  Surgeon: Daneil Dolin, MD;  Location: [facility];  Service: Endoscopy;  Laterality: N/A;   MALONEY DILATION N/A 12/24/2019   Procedure: [name] DILATION;  Surgeon: Daneil Dolin, MD;  Location: [facility];  Service: Endoscopy;  Laterality: N/A;   THYROIDECTOMY Right 12/13/2016   Procedure: RIGHT HEMI-THYROIDECTOMY;  Surgeon: Leta Baptist, MD;  Location: [place];  Service: ENT;  Laterality: Right;   THYROIDECTOMY  07/02/2020   THYROIDECTOMY N/A 07/02/2020   Procedure: COMPLETION THYROIDECTOMY;  Surgeon: Leta Baptist, MD;  Location: [facility];  Service: ENT;  Laterality: N/A;   TONSILLECTOMY      Current Outpatient Medications  Medication Sig Dispense Refill   calcium-vitamin D (OSCAL WITH D) 500-200 MG-UNIT tablet Take 1  tablet by mouth 2 (two) times daily. 60 tablet 10   cetirizine (ZYRTEC) 10 MG tablet Take 10 mg by mouth daily.     Cholecalciferol (VITAMIN D3) 5000 units CAPS Take 1 capsule (5,000 Units total) by mouth daily. 90 capsule 0   cyclobenzaprine (FLEXERIL) 5 MG tablet Take 1 tablet (5 mg total) by mouth 3 (three) times daily as needed. 30 tablet 0   DULoxetine (CYMBALTA) 30 MG capsule Take 1 capsule (30 mg total) by mouth at bedtime. 30 capsule 1   gabapentin (NEURONTIN) 300 MG capsule Take 300 mg by mouth 2 (two) times daily.  levothyroxine (SYNTHROID) 100 MCG tablet Take 1 tablet (100 mcg total) by mouth daily before breakfast. 30 tablet 12   norethindrone (MICRONOR) 0.35 MG tablet Take 1 tablet (0.35 mg total) by mouth daily. 1 Package 11   ondansetron (ZOFRAN-ODT) 8 MG disintegrating tablet Take 1 tablet (8 mg total) by mouth every 8 (eight) hours as needed for nausea. 30 tablet 6   pantoprazole (PROTONIX) 40 MG tablet Take 1 tablet (40 mg total) by mouth 2 (two) times daily before a meal. 180 tablet 3   Probiotic Product (PROBIOTIC DAILY PO) Take 1 capsule by mouth daily.     promethazine (PHENERGAN) 25 MG tablet Take 1 tablet (25 mg total) by mouth every 6 (six) hours as needed for nausea or vomiting. 30 tablet 3   Current Facility-Administered Medications  Medication Dose Route Frequency Provider Last Rate Last Admin   triamcinolone acetonide (KENALOG) 10 MG/ML injection 10 mg  10 mg Other Once Landis Martins, DPM        Allergies as of 06/18/2021   (No Known Allergies)    Vitals: There were no vitals taken for this visit. Last Weight:  Wt Readings from Last 1 Encounters:  03/18/21 196 lb 3.2 oz (89 kg)   Last Height:   Ht Readings from Last 1 Encounters:  03/18/21 5\' 9"  (1.753 m)     Physical exam: Exam: Gen: NAD, conversant, well nourised, obese, well groomed                     CV: RRR, no MRG. No Carotid Bruits. No peripheral edema, warm, nontender Eyes: Conjunctivae  clear without exudates or hemorrhage  Neuro: Detailed Neurologic Exam  Speech:    Speech is normal; fluent and spontaneous with normal comprehension.  Cognition:    The patient is oriented to person, place, and time;     recent and remote memory intact;     language fluent;     normal attention, concentration,     fund of knowledge Cranial Nerves:    The pupils are equal, round, and reactive to light. The fundi are flat.  Visual fields are full to finger confrontation. Extraocular movements are intact. Trigeminal sensation is intact and the muscles of mastication are normal. The face is symmetric. The palate elevates in the midline. Hearing intact. Voice is normal. Shoulder shrug is normal. The tongue has normal motion without fasciculations.   Coordination:    No dysmetria or ataxia   Gait:    Antalgic (low back pain and right leg pain)  Motor Observation:    No asymmetry, no atrophy, and no involuntary movements noted. Tone:    Normal muscle tone.    Posture:    Posture is normal. normal erect    Strength: right wweakness of hip flexion (due to pain). Weakness right leg flexion 4/5. Left leg flexion 5-/5. Otherwise strength is V/V in the upper and lower limbs.      Sensation: intact to LT     Reflex Exam:  DTR's:    Deep tendon reflexes in the upper and lower extremities are normal bilaterally.   Toes:    The toes are downgoing bilaterally.   Clonus:    Clonus is absent.    Assessment/Plan: 35 year old patient with migraines, chronic low back pain with radicular symptoms in an L5-S1 distribution, excessive daytime fatigue possibly periodic limb movement of sleep.  She saw our sleep doctor who offered a sleep test but doesn't appear that happened.  She  was sent to Neurosurgery for MRI lumbar spine results.   Episodic migraine: At onset of migraine take Roselyn Meier and can use the zofran 8mg  for nausea.   MRI lumbar spine wo contrast: Chronic history of low back pain  with radiation in an L5-S1 distribution. We will repeat MRI of the lumbar spine, there is weakness on exam today, antalgic gait, would like to evaluate for surgical procedures or epidural steroid injections. If we do proceed with epidural injections she would prefer Dr. Jobe Igo, in 2016 she had epidural steroid injections and suffered spinal fluid leak so she would feel safer with Dr. Jobe Igo performing the epidural steroid injections if possible. She has failed multiple prior treatments including prior epidural steroid injections, muscle relaxers, analgesics, physical therapy.   She is extremely tired, she tosses and turns, she wakes up every 2-3 hours, she never feels rested, moves her legs constantly all night(PLMS vs RLS). We will send her for evaluation for sleep study to see if she has PLMS, also she has excessive daytime fatigue could she has OSA?     No orders of the defined types were placed in this encounter.  No orders of the defined types were placed in this encounter.   Cc: Glenda Chroman, MD,  Glenda Chroman, MD  Sarina Ill, MD  [facility] [address] [place], [place] [address]  Phone [phone] Fax [phone]

## 2021-06-22 ENCOUNTER — Encounter: Payer: Self-pay | Admitting: Neurology

## 2021-08-06 ENCOUNTER — Encounter: Payer: Self-pay | Admitting: Neurology

## 2021-09-28 ENCOUNTER — Ambulatory Visit: Payer: Medicaid Other | Admitting: Neurology

## 2021-09-28 ENCOUNTER — Encounter: Payer: Self-pay | Admitting: Neurology

## 2021-09-28 ENCOUNTER — Other Ambulatory Visit: Payer: Self-pay

## 2021-09-28 VITALS — BP 101/65 | HR 86 | Ht 69.0 in | Wt 192.0 lb

## 2021-09-28 DIAGNOSIS — G43009 Migraine without aura, not intractable, without status migrainosus: Secondary | ICD-10-CM | POA: Diagnosis not present

## 2021-09-28 DIAGNOSIS — Z820 Family history of epilepsy and other diseases of the nervous system: Secondary | ICD-10-CM

## 2021-09-28 DIAGNOSIS — R202 Paresthesia of skin: Secondary | ICD-10-CM | POA: Diagnosis not present

## 2021-09-28 DIAGNOSIS — R200 Anesthesia of skin: Secondary | ICD-10-CM | POA: Diagnosis not present

## 2021-09-28 MED ORDER — TOPIRAMATE 50 MG PO TABS: 50.0000 mg | ORAL_TABLET | Freq: Every day | ORAL | 5 refills | Status: AC

## 2021-09-28 NOTE — Patient Instructions (Signed)
Increase topiramate to 50mg  at bedtime.  If no improvement in 4 weeks, contact me I want you to try these 2 migraine rescue meds: Roselyn Meier - take one tablet earliest onset of migraine.  May repeat after 2 hours.  Maximum 2 in 24 hours Nurtec- take one in 24 hours as needed 3.   Limit use of pain relievers to no more than 2 days out of week to prevent risk of rebound or medication-overuse headache. 4.  Keep headache diary 5.  Wear wrist splint on left hand at night 6  Follow up 6 months.

## 2021-09-28 NOTE — Progress Notes (Signed)
NEUROLOGY CONSULTATION NOTE  Linda Reid MRN: [id number] DOB: 1986-03-12  Referring provider: Jerene Bears, MD Primary care provider: Jerene Bears, MD  Reason for consult:  headaches  Assessment/Plan:   Migraine without aura, without status migrainosus, not intractable Left hand numbness Family history of early-onset Parkinson's disease.  Does not meet clinical criteria.  1 increase topiramate to 50mg  at bedtime.  If no improvement in 4 weeks, we can increase dose 2  For migraine rescue, will have her try samples of both Ubrelvy and Nurtec.  She will let me know which is more effective 3  Limit use of pain relievers to no more than 2 days out of week to prevent risk of rebound or medication-overuse headache. 4  Keep headache diary 5  Wear wrist splint on left at night 6  Monitor for emergence of symptoms meeting criteria for PD 7  Follow up 6 months.   Subjective:  Linda Reid is a 36 year old female with acquired hypothyroidism, depression and fibromyalgia who presents for migraines.  History supplemented by referring provider's note.  Onset:  2017, following an ESI for lumbar radiculitis which caused intracranial hypotension.  Headaches persisted despite treatment. Location:  right-sided radiating to back of head and down right side of neck Quality:  pressure, shar Intensity:  severe.   Aura:  absent Prodrome:  absent Associated symptoms:  Nausea, photophobia, phonophobia,right aural fullness/ringing, transient right hearing loss, sometimes floaters.  She denies associated unilateral numbness or weakness. Duration:  2 days.  Longest 4 days Frequency:  4 to 5 days a week Frequency of abortive medication: ibuprofen 3 to 4 times a week Triggers:  no Relieving factors:  none Activity:  aggravates  History of left upper extremity problems since 2018.  She initially had pain and numbness down the left arm and into the hand.  MRI of cervical spine and NCV-EMG at  that time, personally reviewed, were unremarkable.  Since then, she reports reduced fine-finger movements in that hand.  She also notes some mild tremor.  She is concerned because she has a strong family history of early-onset Parkinson's disease (father, sister in her early 16s and first cousin in her early 46s).  Cervical X-ray on 07/21/2021 was negative.  Current NSAIDS/analgesics:  ibuprofen Current triptans:  none Current ergotamine:  none Current anti-emetic:  promethazine 25mg  Current muscle relaxants:  baclofen 10mg  BID Current Antihypertensive medications:  none Current Antidepressant medications:  none Current Anticonvulsant medications:  topiramate 25mg  at bedtime Current anti-CGRP:  none Current Vitamins/Herbal/Supplements:  none Current Antihistamines/Decongestants:  none Other therapy:  none Hormone/birth control:  norethindrone, Euthyrox   Past NSAIDS/analgesics:  Excedrin, acetaminophen, indomethacin, ibuprofen, naproxen, tramadol Past abortive triptans:  sumatriptan tab, rizatriptan, zolmitriptan tab, frovatriptan Past abortive ergotamine:  nine Past muscle relaxants:  cyclobenzaprine, tizanidine, methocarbamol Past anti-emetic:  ondansetron Past antihypertensive medications:  none Past antidepressant medications:  duloxetine, escitalopram, fluoxetine Past anticonvulsant medications:  gabapentin Past anti-CGRP:  none Past vitamins/Herbal/Supplements:  Ca, D Past antihistamines/decongestants:  cetirizine Other past therapies:  none       PAST MEDICAL HISTORY: Past Medical History:  Diagnosis Date   Anxiety    Arthritis    lower back, right leg   Back pain    Cerebrospinal fluid leak from spinal puncture 03/03/2016   after epidural steroid injection; states has had 2 blood patch procedures    Difficulty swallowing solids    certain foods   GERD (gastroesophageal reflux disease)    Hyperthyroidism  Migraines    Nasal congestion 12/06/2016   Neck pain  of over 3 months duration    posterior and right side   Neuropathy    right leg   Restless leg syndrome    Thyroid nodule 11/2016   right   Tracheal deviation    due to thyroid nodule, per pt.   Urinary frequency     PAST SURGICAL HISTORY: Past Surgical History:  Procedure Laterality Date   DG MYLEOGRAM LUMBAR SPINE ([place] [facility])     DILATION AND CURETTAGE OF UTERUS  06/25/2002   ESOPHAGEAL MANOMETRY N/A 05/09/2019   normal relaxation of the EG junction, no significant esophageal peristaltic abnormality detected on study   ESOPHAGOGASTRODUODENOSCOPY (EGD) WITH PROPOFOL N/A 08/28/2018   mild Schatzki ring s/p dilation, normal stomach and duodenum   ESOPHAGOGASTRODUODENOSCOPY (EGD) WITH PROPOFOL N/A 12/24/2019    normal esophagus s/p dilation, normal stomach, normal duodenal bulb and second portion of duodenum.    MALONEY DILATION N/A 08/28/2018   Procedure: [name] DILATION;  Surgeon: Daneil Dolin, MD;  [facility];  Service: Endoscopy;  Laterality: N/A;   MALONEY DILATION N/A 12/24/2019   Procedure: [name] DILATION;  Surgeon: Daneil Dolin, MD;  [facility];  Service: Endoscopy;  Laterality: N/A;   THYROIDECTOMY Right 12/13/2016   Procedure: RIGHT HEMI-THYROIDECTOMY;  Surgeon: Leta Baptist, MD;  Location: [place];  Service: ENT;  Laterality: Right;   THYROIDECTOMY  07/02/2020   THYROIDECTOMY N/A 07/02/2020   Procedure: COMPLETION THYROIDECTOMY;  Surgeon: Leta Baptist, MD;  [facility];  Service: ENT;  Laterality: N/A;   TONSILLECTOMY      MEDICATIONS: Current Outpatient Medications on File Prior to Visit  Medication Sig Dispense Refill   calcium-vitamin D (OSCAL WITH D) 500-200 MG-UNIT tablet Take 1 tablet by mouth 2 (two) times daily. 60 tablet 10   cetirizine (ZYRTEC) 10 MG tablet Take 10 mg by mouth daily.     Cholecalciferol (VITAMIN D3) 5000 units CAPS Take 1 capsule (5,000 Units total) by mouth daily. 90 capsule 0   cyclobenzaprine  (FLEXERIL) 5 MG tablet Take 1 tablet (5 mg total) by mouth 3 (three) times daily as needed. 30 tablet 0   DULoxetine (CYMBALTA) 30 MG capsule Take 1 capsule (30 mg total) by mouth at bedtime. 30 capsule 1   gabapentin (NEURONTIN) 300 MG capsule Take 300 mg by mouth 2 (two) times daily.     levothyroxine (SYNTHROID) 100 MCG tablet Take 1 tablet (100 mcg total) by mouth daily before breakfast. 30 tablet 12   norethindrone (MICRONOR) 0.35 MG tablet Take 1 tablet (0.35 mg total) by mouth daily. 1 Package 11   ondansetron (ZOFRAN-ODT) 8 MG disintegrating tablet Take 1 tablet (8 mg total) by mouth every 8 (eight) hours as needed for nausea. 30 tablet 6   pantoprazole (PROTONIX) 40 MG tablet Take 1 tablet (40 mg total) by mouth 2 (two) times daily before a meal. 180 tablet 3   Probiotic Product (PROBIOTIC DAILY PO) Take 1 capsule by mouth daily.     promethazine (PHENERGAN) 25 MG tablet Take 1 tablet (25 mg total) by mouth every 6 (six) hours as needed for nausea or vomiting. 30 tablet 3   Current Facility-Administered Medications on File Prior to Visit  Medication Dose Route Frequency Provider Last Rate Last Admin   triamcinolone acetonide (KENALOG) 10 MG/ML injection 10 mg  10 mg Other Once Landis Martins, DPM        ALLERGIES: No  Known Allergies  FAMILY HISTORY: Family History  Problem Relation Age of Onset   Cancer Maternal Grandmother        breast   Diabetes Maternal Grandmother    Hypertension Maternal Grandmother    Heart disease Maternal Grandfather    Hypertension Maternal Grandfather    Hypertension Paternal Grandmother    Hypertension Paternal Grandfather    Premature birth Daughter    Asthma Daughter    Hypertension Mother    Parkinson's disease Father    Parkinson's disease Sister    Diabetes Maternal Aunt    Hypertension Maternal Aunt    Stroke Maternal Aunt    Diabetes Maternal Uncle    Hypertension Maternal Uncle    Hypertension Paternal Aunt    Hypertension  Paternal Uncle    Parkinson's disease Cousin    Colon cancer Neg Hx    Colon polyps Neg Hx     Objective:  Blood pressure 101/65, pulse 86, height 5\' 9"  (1.753 m), weight 192 lb (87.1 kg), SpO2 96 %. General: No acute distress.  Patient appears well-groomed.   Head:  Normocephalic/atraumatic Eyes:  fundi examined but not visualized Neck: supple, no paraspinal tenderness, full range of motion Back: No paraspinal tenderness Heart: regular rate and rhythm Lungs: Clear to auscultation bilaterally. Vascular: No carotid bruits. Neurological Exam: Mental status: alert and oriented to person, place, and time, recent and remote memory intact, fund of knowledge intact, attention and concentration intact, speech fluent and not dysarthric, language intact. Cranial nerves: CN I: not tested CN II: pupils equal, round and reactive to light, visual fields intact CN III, IV, VI:  full range of motion, no nystagmus, no ptosis CN V: facial sensation intact. CN VII: upper and lower face symmetric CN VIII: hearing intact CN IX, X: gag intact, uvula midline CN XI: sternocleidomastoid and trapezius muscles intact CN XII: tongue midline Bulk & Tone: normal, no rigidity or fasciculations. Motor:  muscle strength 5/5 throughout.  Decreased finger and thumb speed and amplitude bilaterally (left worse than right).  Very fine postural and kinetic tremor in hands.  No resting tremor Sensation:  Pinprick, temperature and vibratory sensation intact. Deep Tendon Reflexes:  2+ throughout,  toes downgoing.   Finger to nose testing:  Without dysmetria.   Heel to shin:  Without dysmetria.   Gait:  Normal station and stride.  Romberg negative.    Thank you for allowing me to take part in the care of this patient.  Metta Clines, DO  CC: Jerene Bears, MD

## 2021-09-28 NOTE — Progress Notes (Signed)
Medication Samples have been provided to the patient.  Drug name: nurtec       Strength: 75 mg        Qty: 1  LOT: [id number]  Exp.Date: 12/2023  Dosing instructions: as needed  The patient has been instructed regarding the correct time, dose, and frequency of taking this medication, including desired effects and most common side effects.   Venetia Night 3:46 PM 09/28/2021     Medication Samples have been provided to the patient.  Drug name: Roselyn Meier       Strength: 100 mg        Qty: 4   LOT: [id number]  Exp.Date: 02/2022  Dosing instructions: as needed  The patient has been instructed regarding the correct time, dose, and frequency of taking this medication, including desired effects and most common side effects.   Venetia Night 3:46 PM 09/28/2021

## 2021-09-29 ENCOUNTER — Ambulatory Visit: Payer: Medicaid Other | Admitting: Gastroenterology

## 2021-09-29 ENCOUNTER — Encounter: Payer: Self-pay | Admitting: *Deleted

## 2021-09-29 ENCOUNTER — Encounter: Payer: Self-pay | Admitting: Gastroenterology

## 2021-09-29 VITALS — BP 117/77 | HR 75 | Temp 97.7°F | Ht 69.0 in | Wt 194.4 lb

## 2021-09-29 DIAGNOSIS — R1010 Upper abdominal pain, unspecified: Secondary | ICD-10-CM | POA: Diagnosis not present

## 2021-09-29 DIAGNOSIS — R140 Abdominal distension (gaseous): Secondary | ICD-10-CM | POA: Diagnosis not present

## 2021-09-29 MED ORDER — PROMETHAZINE HCL 25 MG PO TABS: 25.0000 mg | ORAL_TABLET | Freq: Four times a day (QID) | ORAL | 3 refills | Status: DC | PRN

## 2021-09-29 MED ORDER — ONDANSETRON 8 MG PO TBDP: 8.0000 mg | ORAL_TABLET | Freq: Three times a day (TID) | ORAL | 6 refills | Status: AC | PRN

## 2021-09-29 NOTE — Progress Notes (Signed)
Referring Provider: Glenda Chroman, MD Primary Care Physician:  Glenda Chroman, MD Primary GI: Dr. Gala Romney   Chief Complaint  Patient presents with   Gastroesophageal Reflux   Bloated    HPI:   Eira Alpert is a 36 y.o. female presenting today with a history of globus sensation (chronic, normal manometry), dysphagia, abdominal pain, chronic nausea and bloating, EGD May 2021 with normal esophagus s/p dilation, normal stomach, normal duodenal bulb and second portion of duodenum. Chronic nausea and bloating with GES normal.ENT on board. History of completion thyroidectomy.   Upper abdominal bloating and discomfort after eating. Gallbladder present. Was told she had sludge in her gallbladder many years ago.   GERD: Protonix BID. GERD improved. Phenergan as needed at night, which helps a lot. Having more issues with migraines. Nausea with migraines. Zofran during day.   No unexplained weight loss.  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   Past Medical History:  Diagnosis Date   Anxiety    Arthritis    lower back, right leg   Back pain    Cerebrospinal fluid leak from spinal puncture 03/03/2016   after epidural steroid injection; states has had 2 blood patch procedures    Difficulty swallowing solids    certain foods   GERD (gastroesophageal reflux disease)    Hyperthyroidism    Migraines    Nasal congestion 12/06/2016   Neck pain of over 3 months duration    posterior and right side   Neuropathy    right leg   Restless leg syndrome    Thyroid nodule 11/2016   right   Tracheal deviation    due to thyroid nodule, per pt.   Urinary frequency     Past Surgical History:  Procedure Laterality Date   DG MYLEOGRAM LUMBAR SPINE ([place] HX)     DILATION AND CURETTAGE OF UTERUS  06/25/2002   ESOPHAGEAL MANOMETRY N/A 05/09/2019   normal relaxation of the EG junction, no significant esophageal peristaltic abnormality detected on  study   ESOPHAGOGASTRODUODENOSCOPY (EGD) WITH PROPOFOL N/A 08/28/2018   mild Schatzki ring s/p dilation, normal stomach and duodenum   ESOPHAGOGASTRODUODENOSCOPY (EGD) WITH PROPOFOL N/A 12/24/2019    normal esophagus s/p dilation, normal stomach, normal duodenal bulb and second portion of duodenum.    MALONEY DILATION N/A 08/28/2018   Procedure: [name] DILATION;  Surgeon: Daneil Dolin, MD;  [facility];  Service: Endoscopy;  Laterality: N/A;   MALONEY DILATION N/A 12/24/2019   Procedure: [name] DILATION;  Surgeon: Daneil Dolin, MD;  [facility];  Service: Endoscopy;  Laterality: N/A;   THYROIDECTOMY Right 12/13/2016   Procedure: RIGHT HEMI-THYROIDECTOMY;  Surgeon: Leta Baptist, MD;  Location: [place];  Service: ENT;  Laterality: Right;   THYROIDECTOMY  07/02/2020   THYROIDECTOMY N/A 07/02/2020   Procedure: COMPLETION THYROIDECTOMY;  Surgeon: Leta Baptist, MD;  [facility];  Service: ENT;  Laterality: N/A;   TONSILLECTOMY      Current Outpatient Medications  Medication Sig Dispense Refill   calcium-vitamin D (OSCAL WITH D) 500-200 MG-UNIT tablet Take 1 tablet by mouth 2 (two) times daily. 60 tablet 10   cetirizine (ZYRTEC) 10 MG tablet Take 10 mg by mouth daily.     Cholecalciferol (VITAMIN D3) 5000 units CAPS Take 1 capsule (5,000 Units total) by mouth daily. 90 capsule 0   cyclobenzaprine (FLEXERIL) 5 MG tablet Take 1 tablet (5 mg  total) by mouth 3 (three) times daily as needed. 30 tablet 0   levothyroxine (SYNTHROID) 112 MCG tablet Take 112 mcg by mouth daily.     norethindrone (MICRONOR) 0.35 MG tablet Take 1 tablet (0.35 mg total) by mouth daily. 1 Package 11   norethindrone (MICRONOR) 0.35 MG tablet 1 tablet     pantoprazole (PROTONIX) 40 MG tablet Take 1 tablet (40 mg total) by mouth 2 (two) times daily before a meal. 180 tablet 3   topiramate (TOPAMAX) 50 MG tablet Take 1 tablet (50 mg total) by mouth at bedtime. 30 tablet 5   Vitamin D,  Ergocalciferol, (DRISDOL) 1.25 MG (50000 UNIT) CAPS capsule Take 50,000 Units by mouth once a week.     levothyroxine (SYNTHROID) 100 MCG tablet Take 1 tablet (100 mcg total) by mouth daily before breakfast. (Patient not taking: Reported on 09/29/2021) 30 tablet 12   ondansetron (ZOFRAN-ODT) 8 MG disintegrating tablet Take 1 tablet (8 mg total) by mouth every 8 (eight) hours as needed for nausea. 30 tablet 6   promethazine (PHENERGAN) 25 MG tablet Take 1 tablet (25 mg total) by mouth every 6 (six) hours as needed for nausea or vomiting. 30 tablet 3   Current Facility-Administered Medications  Medication Dose Route Frequency Provider Last Rate Last Admin   triamcinolone acetonide (KENALOG) 10 MG/ML injection 10 mg  10 mg Other Once Landis Martins, DPM        Allergies as of 09/29/2021   (No Known Allergies)    Family History  Problem Relation Age of Onset   Migraines Mother    Hypertension Mother    Parkinsonism Father    Parkinson's disease Father    Parkinsonism Sister    Parkinson's disease Sister    Migraines Brother    Diabetes Maternal Aunt    Hypertension Maternal Aunt    Stroke Maternal Aunt    Diabetes Maternal Uncle    Hypertension Maternal Uncle    Hypertension Paternal Aunt    Hypertension Paternal Uncle    Cancer Maternal Grandmother        breast   Diabetes Maternal Grandmother    Hypertension Maternal Grandmother    Heart disease Maternal Grandfather    Hypertension Maternal Grandfather    Hypertension Paternal Grandmother    Hypertension Paternal Grandfather    Premature birth Daughter    Asthma Daughter    Parkinsonism Cousin    Parkinson's disease Cousin    Colon cancer Neg Hx    Colon polyps Neg Hx     Social History   Socioeconomic History   Marital status: Legally Separated    Spouse name: Psychiatrist    Number of children: 4   Years of education: 4 Bachelor   Highest education level: Not on file  Occupational History   Occupation: Wellsite geologist: [employer]    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    Her fiance is the father of her current pregnancy, other three children were fathered by her previous partner.    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     Caffeine use: none   Right-handed   Social Determinants of Radio broadcast assistant Strain: Not on file  Food Insecurity: Not on file  Transportation Needs: Not on file  Physical Activity: Not on file  Stress: Not on file  Social Connections: Not on file    Review of Systems: Gen: Denies fever, chills, anorexia. Denies fatigue, weakness, weight loss.  CV: Denies chest pain, palpitations, syncope, peripheral edema, and claudication. Resp: Denies dyspnea at rest, cough, wheezing, coughing up blood, and pleurisy. GI: see HPI  Derm: Denies rash, itching, dry skin Psych: Denies depression, anxiety, memory loss, confusion. No homicidal or suicidal ideation.  Heme: Denies bruising, bleeding, and enlarged lymph nodes.  Physical Exam: BP 117/77    Pulse 75    Temp 97.7 F (36.5 C) (Temporal)    Ht 5\' 9"  (1.753 m)    Wt 194 lb 6.4 oz (88.2 kg)    LMP 08/24/2021 (Approximate)    BMI 28.71 kg/m  General:   Alert and oriented. No distress noted. Pleasant and cooperative.  Head:  Normocephalic and atraumatic. Eyes:  Conjuctiva clear without scleral icterus. Mouth:  mask in place Abdomen:  +BS, soft, mild TTP epigastric and non-distended. No rebound or guarding. No HSM or masses noted. Msk:  Symmetrical without gross deformities.  Normal posture. Extremities:  Without edema. Neurologic:  Alert and  oriented x4 Psych:  Alert and cooperative. Normal mood and affect.  ASSESSMENT: Patrece Tallie is a 36 y.o. female presenting today with a history of globus sensation (chronic, normal manometry), dysphagia, abdominal pain, chronic nausea and bloating, EGD May 2021 with normal esophagus s/p dilation, normal stomach, normal duodenal bulb and second portion of duodenum. Chronic nausea and bloating with GES normal.ENT on board. History of completion thyroidectomy.   GERD: improved. Continues on Protonix BID.   Bloating/epigastric pain: EGD as noted. Gallbladder still present. No recent US. Will order [name] to assess for biliary etiology. May need HIDA. Celiac serologies ordered. No alarm signs/symptoms.  Chronic nausea: multifactorial. Migraines also contributing. Phenergan just as needed at night with good control. Zofran during day. GES normal.   PLAN:  Check celiac serologies US abdomen Continue PPI BID Refills provided for Zofran and Phenergan Further recommendations to follow  Annitta Needs, PhD, ANP-BC [facility]

## 2021-09-29 NOTE — Patient Instructions (Signed)
I have ordered blood tests to check for celiac disease.  We have also ordered an ultrasound of your gallbladder.   I have refilled Zofran and promethazine for you!  Further recommendations to follow!  I enjoyed seeing you again today! As you know, I value our relationship and want to provide genuine, compassionate, and quality care. I welcome your feedback. If you receive a survey regarding your visit,  I greatly appreciate you taking time to fill this out. See you next time!  Annitta Needs, PhD, ANP-BC [facility]

## 2021-09-30 LAB — IGA: Immunoglobulin A: 149 mg/dL (ref 47–310)

## 2021-09-30 LAB — TISSUE TRANSGLUTAMINASE, IGA: (tTG) Ab, IgA: <1 U/mL

## 2021-10-08 ENCOUNTER — Other Ambulatory Visit: Payer: Self-pay

## 2021-10-08 ENCOUNTER — Ambulatory Visit (HOSPITAL_COMMUNITY)
Admission: RE | Admit: 2021-10-08 | Discharge: 2021-10-08 | Disposition: A | Discharge status: Home / Self Care | Payer: BC Managed Care – PPO | Source: Ambulatory Visit | Attending: Gastroenterology | Admitting: Gastroenterology

## 2021-10-08 DIAGNOSIS — R1010 Upper abdominal pain, unspecified: Secondary | ICD-10-CM | POA: Diagnosis present

## 2021-10-14 ENCOUNTER — Other Ambulatory Visit: Payer: Self-pay | Admitting: *Deleted

## 2021-10-14 DIAGNOSIS — R1010 Upper abdominal pain, unspecified: Secondary | ICD-10-CM

## 2021-10-21 ENCOUNTER — Other Ambulatory Visit: Payer: Self-pay

## 2021-10-21 ENCOUNTER — Encounter (HOSPITAL_COMMUNITY)
Admission: RE | Admit: 2021-10-21 | Discharge: 2021-10-21 | Disposition: A | Discharge status: Home / Self Care | Payer: Medicaid Other | Source: Ambulatory Visit | Attending: Gastroenterology | Admitting: Gastroenterology

## 2021-10-21 ENCOUNTER — Encounter (HOSPITAL_COMMUNITY): Payer: Self-pay

## 2021-10-21 DIAGNOSIS — R1010 Upper abdominal pain, unspecified: Secondary | ICD-10-CM | POA: Insufficient documentation

## 2021-10-21 MED ORDER — TECHNETIUM TC 99M MEBROFENIN IV KIT
5.0000 | PACK | Freq: Once | INTRAVENOUS | Status: AC | PRN
  Administered 2021-10-21: 5.5 via INTRAVENOUS

## 2021-10-28 ENCOUNTER — Other Ambulatory Visit: Payer: Self-pay | Admitting: Gastroenterology

## 2021-10-28 MED ORDER — AMITRIPTYLINE HCL 10 MG PO TABS: 10.0000 mg | ORAL_TABLET | Freq: Every day | ORAL | 1 refills | Status: AC

## 2021-11-03 ENCOUNTER — Ambulatory Visit: Payer: BC Managed Care – PPO | Admitting: Neurology

## 2021-12-15 ENCOUNTER — Telehealth: Payer: Medicaid Other | Admitting: Gastroenterology

## 2021-12-15 ENCOUNTER — Encounter: Payer: Self-pay | Admitting: Gastroenterology

## 2021-12-15 ENCOUNTER — Telehealth: Payer: Self-pay | Admitting: *Deleted

## 2021-12-15 ENCOUNTER — Telehealth: Payer: Self-pay

## 2021-12-15 ENCOUNTER — Other Ambulatory Visit: Payer: Self-pay

## 2021-12-15 VITALS — Ht 69.0 in | Wt 185.0 lb

## 2021-12-15 DIAGNOSIS — R1310 Dysphagia, unspecified: Secondary | ICD-10-CM | POA: Diagnosis not present

## 2021-12-15 NOTE — Telephone Encounter (Signed)
[facility], you are scheduled for a virtual visit with your provider today.  Just as we do with appointments in the office, we must obtain your consent to participate.  Your consent will be active for this visit and any virtual visit you may have with one of our providers in the next 365 days.  If you have a MyChart account, I can also send a copy of this consent to you electronically.  All virtual visits are billed to your insurance company just like a traditional visit in the office.  As this is a virtual visit, video technology does not allow for your provider to perform a traditional examination.  This may limit your provider's ability to fully assess your condition.  If your provider identifies any concerns that need to be evaluated in person or the need to arrange testing such as labs, EKG, etc, we will make arrangements to do so.  Although advances in technology are sophisticated, we cannot ensure that it will always work on either your end or our end.  If the connection with a video visit is poor, we may have to switch to a telephone visit.  With either a video or telephone visit, we are not always able to ensure that we have a secure connection.   I need to obtain your verbal consent now.   Are you willing to proceed with your visit today?  ?

## 2021-12-15 NOTE — Patient Instructions (Signed)
We are arranging an upper endoscopy with dilation in the near future! ? ?Further recommendations to follow! ? ?I enjoyed seeing you again today! As you know, I value our relationship and want to provide genuine, compassionate, and quality care. I welcome your feedback. If you receive a survey regarding your visit,  I greatly appreciate you taking time to fill this out. See you next time! ? ?Annitta Needs, PhD, ANP-BC ?[facility]  ? ?

## 2021-12-15 NOTE — Telephone Encounter (Signed)
Pt consented to a virtual visit. 

## 2021-12-15 NOTE — Progress Notes (Signed)
? ? ? ? ?Primary Care Physician:  Glenda Chroman, MD  ?Primary GI: Dr. Gala Romney  ? ?Patient Location: Home ?  ?Provider Location: [place] office ?  ?Reason for Visit: Dysphagia.  ?  ?Persons present on the virtual encounter, with roles: Patient and NP ?  ?Total time (minutes) spent on medical discussion:15 minutes ?  ?Due to COVID-19, visit was conducted using virtual method.  Visit was requested by patient. ? ?Virtual Visit via MyChart Video Note ?Due to COVID-19, visit is conducted virtually and was requested by patient.  ? ?I connected with Linda Reid on 12/15/21 at  4:00 PM EDT by video and verified that I am speaking with the correct person using two identifiers. ?  ?I discussed the limitations, risks, security and privacy concerns of performing an evaluation and management service by video and the availability of in person appointments. I also discussed with the patient that there may be a patient responsible charge related to this service. The patient expressed understanding and agreed to proceed. ? ?Chief Complaint  ?Patient presents with  ? Follow-up  ?  Consult for EGD and Dyshagia   ? ? ? ?History of Present Illness: ?36 y.o. female presenting today with a history of globus sensation (chronic, normal manometry), dysphagia, abdominal pain, chronic nausea and bloating, EGD May 2021 with normal esophagus s/p dilation, normal stomach, normal duodenal bulb and second portion of duodenum. Chronic nausea and bloating with GES normal.ENT on board. History of completion thyroidectomy.  ? ?In interim from last visit, [name] with fatty liver. HIDA with EF of 86%. Prescribed amitriptyline due to chronic abdominal pain.  ? ? ?Noted recurrent dysphagia a few months ago around [place]. Has had a cough for about a month after having the flu. Got choked on food recently. Has noted improvement after dilation. Feels amitriptyline has helped with abdominal pain. Took one dose of Miralax due to bloating, which  helped relieve symptoms for a whole week.  ? ?Pantoprazole BID.  ? ?Past Medical History:  ?Diagnosis Date  ? Anxiety   ? Arthritis   ? lower back, right leg  ? Back pain   ? Cerebrospinal fluid leak from spinal puncture 03/03/2016  ? after epidural steroid injection; states has had 2 blood patch procedures   ? Difficulty swallowing solids   ? certain foods  ? GERD (gastroesophageal reflux disease)   ? Hyperthyroidism   ? Migraines   ? Nasal congestion 12/06/2016  ? Neck pain of over 3 months duration   ? posterior and right side  ? Neuropathy   ? right leg  ? Restless leg syndrome   ? Thyroid nodule 11/2016  ? right  ? Tracheal deviation   ? due to thyroid nodule, per pt.  ? Urinary frequency   ? ? ? ?Past Surgical History:  ?Procedure Laterality Date  ? DG MYLEOGRAM LUMBAR SPINE ([place] HX)    ? DILATION AND CURETTAGE OF UTERUS  06/25/2002  ? ESOPHAGEAL MANOMETRY N/A 05/09/2019  ? normal relaxation of the EG junction, no significant esophageal peristaltic abnormality detected on study  ? ESOPHAGOGASTRODUODENOSCOPY (EGD) WITH PROPOFOL N/A 08/28/2018  ? mild Schatzki ring s/p dilation, normal stomach and duodenum  ? ESOPHAGOGASTRODUODENOSCOPY (EGD) WITH PROPOFOL N/A 12/24/2019  ?  normal esophagus s/p dilation, normal stomach, normal duodenal bulb and second portion of duodenum.   ? MALONEY DILATION N/A 08/28/2018  ? Procedure: MALONEY DILATION;  Surgeon: Daneil Dolin, MD;  [facility];  Service: Endoscopy;  Laterality: N/A;  ? MALONEY DILATION N/A 12/24/2019  ? Procedure: MALONEY DILATION;  Surgeon: Daneil Dolin, MD;  [facility];  Service: Endoscopy;  Laterality: N/A;  ? THYROIDECTOMY Right 12/13/2016  ? Procedure: RIGHT HEMI-THYROIDECTOMY;  Surgeon: Leta Baptist, MD;  Location: [facility];  Service: ENT;  Laterality: Right;  ? THYROIDECTOMY  07/02/2020  ? THYROIDECTOMY N/A 07/02/2020  ? Procedure: COMPLETION THYROIDECTOMY;  Surgeon: Leta Baptist, MD;  Location: [facility] OR;  Service: ENT;   Laterality: N/A;  ? TONSILLECTOMY    ? ?Outpatient Encounter Medications as of 12/15/2021  ?Medication Sig  ? amitriptyline (ELAVIL) 10 MG tablet Take 1 tablet (10 mg total) by mouth at bedtime.  ? cetirizine (ZYRTEC) 10 MG tablet Take 10 mg by mouth daily.  ? ondansetron (ZOFRAN-ODT) 8 MG disintegrating tablet Take 1 tablet (8 mg total) by mouth every 8 (eight) hours as needed for nausea.  ? pantoprazole (PROTONIX) 40 MG tablet Take 1 tablet (40 mg total) by mouth 2 (two) times daily before a meal.  ? promethazine (PHENERGAN) 25 MG tablet Take 1 tablet (25 mg total) by mouth every 6 (six) hours as needed for nausea or vomiting.  ? topiramate (TOPAMAX) 50 MG tablet Take 1 tablet (50 mg total) by mouth at bedtime.  ? Vitamin D, Ergocalciferol, (DRISDOL) 1.25 MG (50000 UNIT) CAPS capsule Take 50,000 Units by mouth once a week.  ? [DISCONTINUED] Cholecalciferol (VITAMIN D3) 5000 units CAPS Take 1 capsule (5,000 Units total) by mouth daily. (Patient not taking: Reported on 12/16/2021)  ? [DISCONTINUED] cyclobenzaprine (FLEXERIL) 5 MG tablet Take 1 tablet (5 mg total) by mouth 3 (three) times daily as needed. (Patient not taking: Reported on 12/16/2021)  ? [DISCONTINUED] levothyroxine (SYNTHROID) 112 MCG tablet Take 112 mcg by mouth daily.  ? [DISCONTINUED] norethindrone (MICRONOR) 0.35 MG tablet Take 1 tablet (0.35 mg total) by mouth daily.  ? [DISCONTINUED] norethindrone (MICRONOR) 0.35 MG tablet 1 tablet  ? levothyroxine (SYNTHROID) 100 MCG tablet Take 1 tablet (100 mcg total) by mouth daily before breakfast. (Patient taking differently: Take 112 mcg by mouth daily before breakfast.)  ? [DISCONTINUED] calcium-vitamin D (OSCAL WITH D) 500-200 MG-UNIT tablet Take 1 tablet by mouth 2 (two) times daily. (Patient not taking: Reported on 12/16/2021)  ? ?Facility-Administered Encounter Medications as of 12/15/2021  ?Medication  ? triamcinolone acetonide (KENALOG) 10 MG/ML injection 10 mg  ?  ?  ? ?Family History  ?Problem  Relation Age of Onset  ? Migraines Mother   ? Hypertension Mother   ? Parkinsonism Father   ? Parkinson's disease Father   ? Parkinsonism Sister   ? Parkinson's disease Sister   ? Migraines Brother   ? Diabetes Maternal Aunt   ? Hypertension Maternal Aunt   ? Stroke Maternal Aunt   ? Diabetes Maternal Uncle   ? Hypertension Maternal Uncle   ? Hypertension Paternal Aunt   ? Hypertension Paternal Uncle   ? Cancer Maternal Grandmother   ?     breast  ? Diabetes Maternal Grandmother   ? Hypertension Maternal Grandmother   ? Heart disease Maternal Grandfather   ? Hypertension Maternal Grandfather   ? Hypertension Paternal Grandmother   ? Hypertension Paternal Grandfather   ? Premature birth Daughter   ? Asthma Daughter   ? Parkinsonism Cousin   ? Parkinson's disease Cousin   ? Colon cancer Neg Hx   ? Colon polyps Neg Hx   ? ? ?Social History  ? ?Socioeconomic History  ?  Marital status: Legally Separated  ?  Spouse name: Simrah Chatham   ? Number of children: 4  ? Years of education: 4 Bachelor  ? Highest education level: Not on file  ?Occupational History  ? Occupation: Human resources   ?  Employer: [employer]  ?  Comment: Left position when pregnant  ?Tobacco Use  ? Smoking status: Never  ? Smokeless tobacco: Never  ?Vaping Use  ? Vaping Use: Never used  ?Substance and Sexual Activity  ? Alcohol use: No  ? Drug use: No  ? Sexual activity: Yes  ?  Birth control/protection: Pill  ?Other Topics Concern  ? Not on file  ?Social History Narrative  ? Patient lives with her children.   ? Her fiance is the father of her current pregnancy, other three children were fathered by her previous partner.   ? 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    ? Caffeine use: none  ? Right-handed  ? ?Social Determinants of Health  ? ?Financial Resource Strain: Not on file  ?Food Insecurity: Not on file  ?Transportation Needs: Not on file   ?Physical Activity: Not on file  ?Stress: Not on file  ?Social Connections: Not on file  ? ? ? ? ? Review of Systems: ?Gen: Denies fever, chills, anorexia. Denies fatigue, weakness, weight loss.  ?CV: Denies chest p

## 2021-12-15 NOTE — Telephone Encounter (Signed)
Called pt, EGD/DIL scheduled for 12/23/21 at 1:30pm. Advised her to go to [facility] lab 12/21/21 for pregnancy test. Lab letter and procedure instructions sent to pt via MyChart. Orders entered for procedure. ?

## 2021-12-21 ENCOUNTER — Other Ambulatory Visit (HOSPITAL_COMMUNITY)
Admission: RE | Admit: 2021-12-21 | Discharge: 2021-12-21 | Disposition: A | Discharge status: Home / Self Care | Payer: Medicaid Other | Source: Ambulatory Visit | Attending: Internal Medicine | Admitting: Internal Medicine

## 2021-12-21 DIAGNOSIS — R1310 Dysphagia, unspecified: Secondary | ICD-10-CM

## 2021-12-21 LAB — PREGNANCY, URINE: Preg Test, Ur: NEGATIVE

## 2021-12-22 ENCOUNTER — Telehealth: Payer: Self-pay

## 2021-12-22 NOTE — Telephone Encounter (Signed)
Called pt to see if she can arrive at 6:30am tomorrow for EGD. Pt is unable to arrive earlier. She had already made arrangements with her job. Melanie at [place] informed. ?

## 2021-12-23 ENCOUNTER — Encounter (HOSPITAL_COMMUNITY)
Admission: RE | Disposition: A | Discharge status: Home / Self Care | Payer: Self-pay | Source: Home / Self Care | Attending: Internal Medicine

## 2021-12-23 ENCOUNTER — Ambulatory Visit (HOSPITAL_BASED_OUTPATIENT_CLINIC_OR_DEPARTMENT_OTHER): Payer: Medicaid Other | Admitting: Anesthesiology

## 2021-12-23 ENCOUNTER — Ambulatory Visit (HOSPITAL_COMMUNITY)
Admission: RE | Admit: 2021-12-23 | Discharge: 2021-12-23 | Disposition: A | Discharge status: Home / Self Care | Payer: Medicaid Other | Attending: Internal Medicine | Admitting: Internal Medicine

## 2021-12-23 ENCOUNTER — Other Ambulatory Visit: Payer: Self-pay

## 2021-12-23 ENCOUNTER — Ambulatory Visit (HOSPITAL_COMMUNITY): Payer: Medicaid Other | Admitting: Anesthesiology

## 2021-12-23 ENCOUNTER — Encounter (HOSPITAL_COMMUNITY): Payer: Self-pay | Admitting: Internal Medicine

## 2021-12-23 DIAGNOSIS — Z79899 Other long term (current) drug therapy: Secondary | ICD-10-CM | POA: Insufficient documentation

## 2021-12-23 DIAGNOSIS — E039 Hypothyroidism, unspecified: Secondary | ICD-10-CM | POA: Diagnosis not present

## 2021-12-23 DIAGNOSIS — K259 Gastric ulcer, unspecified as acute or chronic, without hemorrhage or perforation: Secondary | ICD-10-CM | POA: Insufficient documentation

## 2021-12-23 DIAGNOSIS — K219 Gastro-esophageal reflux disease without esophagitis: Secondary | ICD-10-CM | POA: Diagnosis not present

## 2021-12-23 DIAGNOSIS — R1310 Dysphagia, unspecified: Secondary | ICD-10-CM | POA: Insufficient documentation

## 2021-12-23 DIAGNOSIS — K319 Disease of stomach and duodenum, unspecified: Secondary | ICD-10-CM | POA: Insufficient documentation

## 2021-12-23 DIAGNOSIS — K449 Diaphragmatic hernia without obstruction or gangrene: Secondary | ICD-10-CM | POA: Diagnosis not present

## 2021-12-23 HISTORY — PX: ESOPHAGOGASTRODUODENOSCOPY (EGD) WITH PROPOFOL: SHX5813

## 2021-12-23 HISTORY — PX: BIOPSY: SHX5522

## 2021-12-23 HISTORY — PX: MALONEY DILATION: SHX5535

## 2021-12-23 SURGERY — ESOPHAGOGASTRODUODENOSCOPY (EGD) WITH PROPOFOL
Anesthesia: General

## 2021-12-23 MED ORDER — PROPOFOL 10 MG/ML IV BOLUS
INTRAVENOUS | Status: DC | PRN
  Administered 2021-12-23: 50 mg via INTRAVENOUS
  Administered 2021-12-23: 150 mg via INTRAVENOUS

## 2021-12-23 MED ORDER — LACTATED RINGERS IV SOLN: INTRAVENOUS | Status: DC

## 2021-12-23 MED ORDER — PROPOFOL 500 MG/50ML IV EMUL
INTRAVENOUS | Status: DC | PRN
  Administered 2021-12-23: 150 ug/kg/min via INTRAVENOUS

## 2021-12-23 MED ORDER — LIDOCAINE HCL (CARDIAC) PF 100 MG/5ML IV SOSY
PREFILLED_SYRINGE | INTRAVENOUS | Status: DC | PRN
  Administered 2021-12-23: 50 mg via INTRAVENOUS

## 2021-12-23 NOTE — H&P (Signed)
$'@LOGO'h$ @ ? ? ?Primary Care Physician:  Glenda Chroman, MD ?Primary Gastroenterologist:  Dr.  Marland Kitchen ?Pre-Procedure History & Physical: ?HPI:  Linda Reid is a 36 y.o. female here for For further management of recurrent dysphagia.  History of a Schatzki's ring dilated in the past.  EGD 2 years ago today demonstrated normal esophagus she responded nicely to empiric passage of a 56 [place] Maloney dilator.  Previous EM normal.  Came off her reflux medicines recently now back on Protonix twice daily ? ?Past Medical History:  ?Diagnosis Date  ? Anxiety   ? Arthritis   ? lower back, right leg  ? Back pain   ? Cerebrospinal fluid leak from spinal puncture 03/03/2016  ? after epidural steroid injection; states has had 2 blood patch procedures   ? Difficulty swallowing solids   ? certain foods  ? GERD (gastroesophageal reflux disease)   ? Hyperthyroidism   ? Migraines   ? Nasal congestion 12/06/2016  ? Neck pain of over 3 months duration   ? posterior and right side  ? Neuropathy   ? right leg  ? Restless leg syndrome   ? Thyroid nodule 11/2016  ? right  ? Tracheal deviation   ? due to thyroid nodule, per pt.  ? Urinary frequency   ? ? ?Past Surgical History:  ?Procedure Laterality Date  ? DG MYLEOGRAM LUMBAR SPINE ([place] HX)    ? DILATION AND CURETTAGE OF UTERUS  06/25/2002  ? ESOPHAGEAL MANOMETRY N/A 05/09/2019  ? normal relaxation of the EG junction, no significant esophageal peristaltic abnormality detected on study  ? ESOPHAGOGASTRODUODENOSCOPY (EGD) WITH PROPOFOL N/A 08/28/2018  ? mild Schatzki ring s/p dilation, normal stomach and duodenum  ? ESOPHAGOGASTRODUODENOSCOPY (EGD) WITH PROPOFOL N/A 12/24/2019  ?  normal esophagus s/p dilation, normal stomach, normal duodenal bulb and second portion of duodenum.   ? MALONEY DILATION N/A 08/28/2018  ? Procedure: MALONEY DILATION;  Surgeon: Daneil Dolin, MD;  [facility];  Service: Endoscopy;  Laterality: N/A;  ? MALONEY DILATION N/A 12/24/2019  ? Procedure: MALONEY  DILATION;  Surgeon: Daneil Dolin, MD;  [facility];  Service: Endoscopy;  Laterality: N/A;  ? THYROIDECTOMY Right 12/13/2016  ? Procedure: RIGHT HEMI-THYROIDECTOMY;  Surgeon: Leta Baptist, MD;  Location: [place];  Service: ENT;  Laterality: Right;  ? THYROIDECTOMY  07/02/2020  ? THYROIDECTOMY N/A 07/02/2020  ? Procedure: COMPLETION THYROIDECTOMY;  Surgeon: Leta Baptist, MD;  Location: [facility] OR;  Service: ENT;  Laterality: N/A;  ? TONSILLECTOMY    ? ? ?Prior to Admission medications   ?Medication Sig Start Date End Date Taking? Authorizing Provider  ?amitriptyline (ELAVIL) 10 MG tablet Take 1 tablet (10 mg total) by mouth at bedtime. 10/28/21  Yes Annitta Needs, NP  ?cetirizine (ZYRTEC) 10 MG tablet Take 10 mg by mouth daily.   Yes [provider]  ?levothyroxine (SYNTHROID) 100 MCG tablet Take 1 tablet (100 mcg total) by mouth daily before breakfast. ?Patient taking differently: Take 112 mcg by mouth daily before breakfast. 07/03/20 12/16/21 Yes Leta Baptist, MD  ?ondansetron (ZOFRAN-ODT) 8 MG disintegrating tablet Take 1 tablet (8 mg total) by mouth every 8 (eight) hours as needed for nausea. 09/29/21  Yes Annitta Needs, NP  ?pantoprazole (PROTONIX) 40 MG tablet Take 1 tablet (40 mg total) by mouth 2 (two) times daily before a meal. 05/07/20  Yes Annitta Needs, NP  ?promethazine (PHENERGAN) 25 MG tablet Take 1 tablet (25 mg total)  by mouth every 6 (six) hours as needed for nausea or vomiting. 09/29/21  Yes Annitta Needs, NP  ?topiramate (TOPAMAX) 50 MG tablet Take 1 tablet (50 mg total) by mouth at bedtime. 09/28/21  Yes Pieter Partridge, DO  ?Vitamin D, Ergocalciferol, (DRISDOL) 1.25 MG (50000 UNIT) CAPS capsule Take 50,000 Units by mouth once a week. 04/16/21  Yes [provider]  ? ? ?Allergies as of 12/15/2021  ? (No Known Allergies)  ? ? ?Family History  ?Problem Relation Age of Onset  ? Migraines Mother   ? Hypertension Mother   ? Parkinsonism Father   ? Parkinson's disease Father   ?  Parkinsonism Sister   ? Parkinson's disease Sister   ? Migraines Brother   ? Diabetes Maternal Aunt   ? Hypertension Maternal Aunt   ? Stroke Maternal Aunt   ? Diabetes Maternal Uncle   ? Hypertension Maternal Uncle   ? Hypertension Paternal Aunt   ? Hypertension Paternal Uncle   ? Cancer Maternal Grandmother   ?     breast  ? Diabetes Maternal Grandmother   ? Hypertension Maternal Grandmother   ? Heart disease Maternal Grandfather   ? Hypertension Maternal Grandfather   ? Hypertension Paternal Grandmother   ? Hypertension Paternal Grandfather   ? Premature birth Daughter   ? Asthma Daughter   ? Parkinsonism Cousin   ? Parkinson's disease Cousin   ? Colon cancer Neg Hx   ? Colon polyps Neg Hx   ? ? ?Social History  ? ?Socioeconomic History  ? Marital status: Legally Separated  ?  Spouse name: Valmai Vandenberghe   ? Number of children: 4  ? Years of education: 4 Bachelor  ? Highest education level: Not on file  ?Occupational History  ? Occupation: Human resources   ?  Employer: [employer]  ?  Comment: Left position when pregnant  ?Tobacco Use  ? Smoking status: Never  ? Smokeless tobacco: Never  ?Vaping Use  ? Vaping Use: Never used  ?Substance and Sexual Activity  ? Alcohol use: No  ? Drug use: No  ? Sexual activity: Yes  ?  Birth control/protection: Pill  ?Other Topics Concern  ? Not on file  ?Social History Narrative  ? Patient lives with her children.   ? Her fiance is the father of her current pregnancy, other three children were fathered by her previous partner.   ? 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    ? Caffeine use: none  ? Right-handed  ? ?Social Determinants of Health  ? ?Financial Resource Strain: Not on file  ?Food Insecurity: Not on file  ?Transportation Needs: Not on file  ?Physical Activity: Not on file  ?Stress: Not on file  ?Social Connections: Not on file  ?Intimate Partner Violence: Not on  file  ? ? ?Review of Systems: ?See HPI, otherwise negative ROS ? ?Physical Exam: ?BP 103/67   Pulse 73   Temp 98.2 ?F (36.8 ?C) (Oral)   Resp 15   Ht '5\' 9"'$  (1.753 m)   Wt 83.9 kg   SpO2 99%   BMI 27.32 kg/m?  ?General:   Alert,  Well-developed, well-nourished, pleasant and cooperative in NAD ?Neck:  Supple; no masses or thyromegaly. No significant cervical adenopathy. ?Lungs:  Clear throughout to auscultation.   No wheezes, crackles, or rhonchi. No acute distress. ?Heart:  Regular rate and rhythm; no  murmurs, clicks, rubs,  or gallops. ?Abdomen: Non-distended, normal bowel sounds.  Soft and nontender without appreciable mass or hepatosplenomegaly.  ?Pulses:  Normal pulses noted. ?Extremities:  Without clubbing or edema. ? ?Impression/Plan:   36 year old lady with recurrent esophageal dysphagia.  History of subtle Schatzki's ring.  Patent tubular esophagus 2 years ago.  Empiric dilation was associated with longstanding improvement until recently. ?  Also, complains of intermittent nausea and bloating.  Does not vomit. ? ?I have offered the patient EGD with esophageal dilation as feasible/appropriate per plan. ? ? ? ?Notice: This dictation was prepared with Dragon dictation along with smaller phrase technology. Any transcriptional errors that result from this process are unintentional and may not be corrected upon review.  ? ?

## 2021-12-23 NOTE — Op Note (Signed)
[facility] ?Patient Name: Linda Reid ?Procedure Date: 12/23/2021 1:01 PM ?MRN: [id number] ?Date of Birth: 12/10/1985 ?Attending MD: Norvel Richards , MD ?CSN: [id number] ?Age: 36 ?Admit Type: Outpatient ?Procedure:                Upper GI endoscopy ?Indications:              Dysphagia ?Providers:                Norvel Richards, MD, Janeece Riggers, RN, Ulice Dash  ?                          Blima Singer, Technician ?Referring MD:              ?Medicines:                Propofol per Anesthesia ?Complications:            No immediate complications. ?Estimated Blood Loss:     Estimated blood loss was minimal. ?Procedure:                Pre-Anesthesia Assessment: ?                          - Prior to the procedure, a History and Physical  ?                          was performed, and patient medications and  ?                          allergies were reviewed. The patient's tolerance of  ?                          previous anesthesia was also reviewed. The risks  ?                          and benefits of the procedure and the sedation  ?                          options and risks were discussed with the patient.  ?                          All questions were answered, and informed consent  ?                          was obtained. Prior Anticoagulants: The patient has  ?                          taken no previous anticoagulant or antiplatelet  ?                          agents. ASA Grade Assessment: II - A patient with  ?                          mild systemic disease. After reviewing the risks  ?  and benefits, the patient was deemed in  ?                          satisfactory condition to undergo the procedure. ?                          After obtaining informed consent, the endoscope was  ?                          passed under direct vision. Throughout the  ?                          procedure, the patient's blood pressure, pulse, and  ?                          oxygen saturations  were monitored continuously. The  ?                          GIF-H190 ([id number]) scope was introduced through the  ?                          mouth, and advanced to the second part of duodenum.  ?                          The upper GI endoscopy was accomplished without  ?                          difficulty. The patient tolerated the procedure  ?                          well. ?Scope In: 1:23:30 PM ?Scope Out: 1:30:13 PM ?Total Procedure Duration: 0 hours 6 minutes 43 seconds  ?Findings: ?     The examined esophagus was normal. ?     A small hiatal hernia was present. Scattered gastric erosions along the  ?     greater curvature extending into the antrum. No ulcer or infiltrating  ?     process seen. Patent pylorus. ?     The duodenal bulb and second portion of the duodenum were normal. The  ?     scope was withdrawn. Dilation was performed with a Maloney dilator with  ?     mild resistance at 56 Fr. The dilation site was examined following  ?     endoscope reinsertion and showed moderate mucosal disruption. Estimated  ?     blood loss was minimal. Finally, biopsies of the inflamed appearing  ?     gastric mucosa taken for histologic study. ?Impression:               - Normal esophagus. Dilated. ?                          - Small hiatal hernia. Gastric erosions -status  ?                          post gastric biopsy ?                          -  Normal duodenal bulb and second portion of the  ?                          duodenum. ?                          -Some of patient's symptoms fall into the category  ?                          of dyspepsia (specifically gas bloat syndrome).  ?                          Phenergan has not helped with bloating and nausea. ?Moderate Sedation: ?     Moderate (conscious) sedation was personally administered by an  ?     anesthesia professional. The following parameters were monitored: oxygen  ?     saturation, heart rate, blood pressure, respiratory rate, EKG, adequacy  ?     of  pulmonary ventilation, and response to care. ?Recommendation:           - Patient has a contact number available for  ?                          emergencies. The signs and symptoms of potential  ?                          delayed complications were discussed with the  ?                          patient. Return to normal activities tomorrow.  ?                          Written discharge instructions were provided to the  ?                          patient. ?                          - Advance diet as tolerated. Continue Protonix 40  ?                          mg twice daily for the time being however,  ?                          recommended she take her first dose before  ?                          breakfast in the morning and her second dose before  ?                          supper in the evening. Stop Phenergan. Use  ?                          over-the-counter FD gard (menthol supplement)  ?  before meals as needed for gas bloat. Follow-up on  ?                          pathology. Office visit with [name] in 3 months ?Procedure Code(s):        --- Professional --- ?                          [phone], Esophagogastroduodenoscopy, flexible,  ?                          transoral; diagnostic, including collection of  ?                          specimen(s) by brushing or washing, when performed  ?                          (separate procedure) ?                          43450, Dilation of esophagus, by unguided sound or  ?                          bougie, single or multiple passes ?Diagnosis Code(s):        --- Professional --- ?                          K44.9, Diaphragmatic hernia without obstruction or  ?                          gangrene ?                          R13.10, Dysphagia, unspecified ?CPT copyright 2019 American Medical Association. All rights reserved. ?The codes documented in this report are preliminary and upon coder review may  ?be revised to meet current compliance  requirements. ?Cristopher Estimable. Avion Kutzer, MD ?Norvel Richards, MD ?12/23/2021 1:43:41 PM ?This report has been signed electronically. ?Number of Addenda: 0 ?

## 2021-12-23 NOTE — Progress Notes (Signed)
Dr. Briant Cedar in to talk to patient, reassured patient. Left eye no watery discharge and minimal erythema.  Warm compress patient advised if needed.  Patient verbalized understanding. ?

## 2021-12-23 NOTE — Discharge Instructions (Addendum)
EGD ?Discharge instructions ?Please read the instructions outlined below and refer to this sheet in the next few weeks. These discharge instructions provide you with general information on caring for yourself after you leave the hospital. Your doctor may also give you specific instructions. While your treatment has been planned according to the most current medical practices available, unavoidable complications occasionally occur. If you have any problems or questions after discharge, please call your doctor. ?ACTIVITY ?You may resume your regular activity but move at a slower pace for the next 24 hours.  ?Take frequent rest periods for the next 24 hours.  ?Walking will help expel (get rid of) the air and reduce the bloated feeling in your abdomen.  ?No driving for 24 hours (because of the anesthesia (medicine) used during the test).  ?You may shower.  ?Do not sign any important legal documents or operate any machinery for 24 hours (because of the anesthesia used during the test).  ?NUTRITION ?Drink plenty of fluids.  ?You may resume your normal diet.  ?Begin with a light meal and progress to your normal diet.  ?Avoid alcoholic beverages for 24 hours or as instructed by your caregiver.  ?MEDICATIONS ?You may resume your normal medications unless your caregiver tells you otherwise.  ?WHAT YOU CAN EXPECT TODAY ?You may experience abdominal discomfort such as a feeling of fullness or ?gas? pains.  ?FOLLOW-UP ?Your doctor will discuss the results of your test with you.  ?SEEK IMMEDIATE MEDICAL ATTENTION IF ANY OF THE FOLLOWING OCCUR: ?Excessive nausea (feeling sick to your stomach) and/or vomiting.  ?Severe abdominal pain and distention (swelling).  ?Trouble swallowing.  ?Temperature over 101? F (37.8? C).  ?Rectal bleeding or vomiting of blood.   ? ? ?Your esophagus was stretched again today ? ?Your stomach appeared slightly inflamed it was biopsied. ? ?Stop Phenergan or promethazine ? ?Continue Protonix twice daily  (before a morning meal or snack and before your evening supper meal ? ? try over-the-counter IDgard ( menthol product).   take before meals per package instructions.   Natural remedy to decrease bloating and nausea. ? ?  Office visit with [name] in 3 months.  ? ?  Further recommendations to follow pending review of pathology report ? ?At patient request, I called Levi Aland at [phone] to reach. ?

## 2021-12-23 NOTE — Anesthesia Procedure Notes (Signed)
Date/Time: 12/23/2021 1:24 PM ?Performed by: Orlie Dakin, CRNA ?Pre-anesthesia Checklist: Patient identified, Emergency Drugs available, Suction available and Patient being monitored ?Patient Re-evaluated:Patient Re-evaluated prior to induction ?Oxygen Delivery Method: Nasal cannula ?Induction Type: IV induction ?Placement Confirmation: positive ETCO2 ? ? ? ? ?

## 2021-12-23 NOTE — Progress Notes (Signed)
Patient states "my left eye is a little itchy and scratchy", patient rates a "3", on scale zero to ten.  Left eye with clear watery fluid, minimal , sclera slight erythema. Dr. Briant Cedar notified via telephone. ?

## 2021-12-23 NOTE — Anesthesia Preprocedure Evaluation (Signed)
Anesthesia Evaluation  ?Patient identified by MRN, date of birth, ID band ?Patient awake ? ? ? ?Reviewed: ?Allergy & Precautions, H&P , NPO status , Patient's Chart, lab work & pertinent test results, reviewed documented beta blocker date and time  ? ?Airway ?Mallampati: II ? ?TM Distance: >3 FB ?Neck ROM: full ? ? ? Dental ?no notable dental hx. ? ?  ?Pulmonary ?neg pulmonary ROS,  ?  ?Pulmonary exam normal ?breath sounds clear to auscultation ? ? ? ? ? ? Cardiovascular ?Exercise Tolerance: Good ?negative cardio ROS ? ? ?Rhythm:regular Rate:Normal ? ? ?  ?Neuro/Psych ? Headaches, PSYCHIATRIC DISORDERS Anxiety Depression   ? GI/Hepatic ?Neg liver ROS, GERD  Medicated,  ?Endo/Other  ?Hypothyroidism  ? Renal/GU ?negative Renal ROS  ?negative genitourinary ?  ?Musculoskeletal ? ? Abdominal ?  ?Peds ? Hematology ?negative hematology ROS ?(+)   ?Anesthesia Other Findings ? ? Reproductive/Obstetrics ?negative OB ROS ? ?  ? ? ? ? ? ? ? ? ? ? ? ? ? ?  ?  ? ? ? ? ? ? ? ? ?Anesthesia Physical ?Anesthesia Plan ? ?ASA: 2 ? ?Anesthesia Plan: General  ? ?Post-op Pain Management:   ? ?Induction:  ? ?PONV Risk Score and Plan: Propofol infusion ? ?Airway Management Planned:  ? ?Additional Equipment:  ? ?Intra-op Plan:  ? ?Post-operative Plan:  ? ?Informed Consent: I have reviewed the patients History and Physical, chart, labs and discussed the procedure including the risks, benefits and alternatives for the proposed anesthesia with the patient or authorized representative who has indicated his/her understanding and acceptance.  ? ? ? ?Dental Advisory Given ? ?Plan Discussed with: CRNA ? ?Anesthesia Plan Comments:   ? ? ? ? ? ? ?Anesthesia Quick Evaluation ? ?

## 2021-12-23 NOTE — Transfer of Care (Signed)
Immediate Anesthesia Transfer of Care Note ? ?Patient: Linda Reid ? ?Procedure(s) Performed: ESOPHAGOGASTRODUODENOSCOPY (EGD) WITH PROPOFOL ?MALONEY DILATION ?BIOPSY ? ?Patient Location: Endoscopy Unit ? ?Anesthesia Type:General ? ?Level of Consciousness: drowsy ? ?Airway & Oxygen Therapy: Patient Spontanous Breathing ? ?Post-op Assessment: Report given to RN and Post -op Vital signs reviewed and stable ? ?Post vital signs: Reviewed and stable ? ?Last Vitals:  ?Vitals Value Taken Time  ?BP    ?Temp    ?Pulse    ?Resp    ?SpO2    ? ? ?Last Pain:  ?Vitals:  ? 12/23/21 1216  ?TempSrc: Oral  ?PainSc: 0-No pain  ?   ? ?  ? ?Complications: No notable events documented. ?

## 2021-12-23 NOTE — Progress Notes (Signed)
"  I think my eye will be ok, not watering and not as itchy or painful now" at discharge. Fiance Linda Reid advised and verbalized understanding discharge instructions. ?

## 2021-12-24 NOTE — Anesthesia Postprocedure Evaluation (Signed)
Anesthesia Post Note ? ?Patient: Linda Reid ? ?Procedure(s) Performed: ESOPHAGOGASTRODUODENOSCOPY (EGD) WITH PROPOFOL ?MALONEY DILATION ?BIOPSY ? ?Patient location during evaluation: Phase II ?Anesthesia Type: General ?Level of consciousness: awake ?Pain management: pain level controlled ?Vital Signs Assessment: post-procedure vital signs reviewed and stable ?Respiratory status: spontaneous breathing and respiratory function stable ?Cardiovascular status: blood pressure returned to baseline and stable ?Postop Assessment: no headache and no apparent nausea or vomiting ?Anesthetic complications: no ?Comments: Late entry ? ? ?No notable events documented. ? ? ?Last Vitals:  ?Vitals:  ? 12/23/21 1216 12/23/21 1334  ?BP: 103/67 (!) 90/50  ?Pulse: 73 80  ?Resp: 15 17  ?Temp: 36.8 ?C (!) 36.4 ?C  ?SpO2: 99% 100%  ?  ?Last Pain:  ?Vitals:  ? 12/23/21 1334  ?TempSrc: Oral  ?PainSc: 0-No pain  ? ? ?  ?  ?  ?  ?  ?  ? ?Louann Sjogren ? ? ? ? ?

## 2021-12-25 LAB — SURGICAL PATHOLOGY

## 2021-12-29 ENCOUNTER — Encounter: Payer: Self-pay | Admitting: Internal Medicine

## 2021-12-30 ENCOUNTER — Ambulatory Visit: Payer: Medicaid Other | Admitting: Internal Medicine

## 2021-12-31 ENCOUNTER — Encounter (HOSPITAL_COMMUNITY): Payer: Self-pay | Admitting: Internal Medicine

## 2022-02-03 ENCOUNTER — Ambulatory Visit: Payer: BC Managed Care – PPO | Admitting: Gastroenterology

## 2022-03-24 NOTE — Progress Notes (Deleted)
NEUROLOGY FOLLOW UP OFFICE NOTE  Linda Reid [id number]  Assessment/Plan:   Migraine without aura, without status migrainosus, not intractable Left hand numbness Family history of early-onset Parkinson's disease.  Does not meet clinical criteria.   1 increase topiramate to '50mg'$  at bedtime.  If no improvement in 4 weeks, we can increase dose 2  For migraine rescue, will have her try samples of both Ubrelvy and Nurtec.  She will let me know which is more effective 3  Limit use of pain relievers to no more than 2 days out of week to prevent risk of rebound or medication-overuse headache. 4  Keep headache diary 5  Wear wrist splint on left at night 6  Monitor for emergence of symptoms meeting criteria for PD 7  Follow up 6 months.     Subjective:  Linda Reid is a 36 year old female with acquired hypothyroidism, depression and fibromyalgia who follows up for migraine.  UPDATE: Last visit, increased topiramate to '50mg'$  at bedtime.  She tried both [employer] and [employer] *** Intensity:  *** Duration:  *** Frequency:  ***  Advised to wear splint on left wrist at night.  ***  Frequency of abortive medication: *** Current NSAIDS/analgesics:  ibuprofen Current triptans:  none Current ergotamine:  none Current anti-emetic:  promethazine '25mg'$  Current muscle relaxants:  baclofen '10mg'$  BID Current Antihypertensive medications:  none Current Antidepressant medications:  none Current Anticonvulsant medications:  topiramate '50mg'$  at bedtime Current anti-CGRP:  none Current Vitamins/Herbal/Supplements:  none Current Antihistamines/Decongestants:  none Other therapy:  none Hormone/birth control:  norethindrone, Euthyrox  HISTORY:  Onset:  2017, following an ESI for lumbar radiculitis which caused intracranial hypotension.  Headaches persisted despite treatment. Location:  right-sided radiating to back of head and down right side of neck Quality:  pressure, shar Intensity:   severe.   Aura:  absent Prodrome:  absent Associated symptoms:  Nausea, photophobia, phonophobia,right aural fullness/ringing, transient right hearing loss, sometimes floaters.  She denies associated unilateral numbness or weakness. Duration:  2 days.  Longest 4 days Frequency:  4 to 5 days a week Frequency of abortive medication: ibuprofen 3 to 4 times a week Triggers:  no Relieving factors:  none Activity:  aggravates   History of left upper extremity problems since 2018.  She initially had pain and numbness down the left arm and into the hand.  MRI of cervical spine and NCV-EMG at that time, personally reviewed, were unremarkable.  Since then, she reports reduced fine-finger movements in that hand.  She also notes some mild tremor.  She is concerned because she has a strong family history of early-onset Parkinson's disease (father, sister in her early 80s and first cousin in her early 20s).  Cervical X-ray on 07/21/2021 was negative.      Past NSAIDS/analgesics:  Excedrin, acetaminophen, indomethacin, ibuprofen, naproxen, tramadol Past abortive triptans:  sumatriptan tab, rizatriptan, zolmitriptan tab, frovatriptan Past abortive ergotamine:  nine Past muscle relaxants:  cyclobenzaprine, tizanidine, methocarbamol Past anti-emetic:  ondansetron Past antihypertensive medications:  none Past antidepressant medications:  duloxetine, escitalopram, fluoxetine Past anticonvulsant medications:  gabapentin Past anti-CGRP:  none Past vitamins/Herbal/Supplements:  Ca, D Past antihistamines/decongestants:  cetirizine Other past therapies:  none  PAST MEDICAL HISTORY: Past Medical History:  Diagnosis Date   Anxiety    Arthritis    lower back, right leg   Back pain    Cerebrospinal fluid leak from spinal puncture 03/03/2016   after epidural steroid injection; states has had 2 blood patch procedures  Difficulty swallowing solids    certain foods   GERD (gastroesophageal reflux disease)     Hyperthyroidism    Migraines    Nasal congestion 12/06/2016   Neck pain of over 3 months duration    posterior and right side   Neuropathy    right leg   Restless leg syndrome    Thyroid nodule 11/2016   right   Tracheal deviation    due to thyroid nodule, per pt.   Urinary frequency     MEDICATIONS: Current Outpatient Medications on File Prior to Visit  Medication Sig Dispense Refill   amitriptyline (ELAVIL) 10 MG tablet Take 1 tablet (10 mg total) by mouth at bedtime. 30 tablet 1   cetirizine (ZYRTEC) 10 MG tablet Take 10 mg by mouth daily.     levothyroxine (SYNTHROID) 100 MCG tablet Take 1 tablet (100 mcg total) by mouth daily before breakfast. (Patient taking differently: Take 112 mcg by mouth daily before breakfast.) 30 tablet 12   ondansetron (ZOFRAN-ODT) 8 MG disintegrating tablet Take 1 tablet (8 mg total) by mouth every 8 (eight) hours as needed for nausea. 30 tablet 6   pantoprazole (PROTONIX) 40 MG tablet Take 1 tablet (40 mg total) by mouth 2 (two) times daily before a meal. 180 tablet 3   topiramate (TOPAMAX) 50 MG tablet Take 1 tablet (50 mg total) by mouth at bedtime. 30 tablet 5   Vitamin D, Ergocalciferol, (DRISDOL) 1.25 MG (50000 UNIT) CAPS capsule Take 50,000 Units by mouth once a week.     Current Facility-Administered Medications on File Prior to Visit  Medication Dose Route Frequency Provider Last Rate Last Admin   triamcinolone acetonide (KENALOG) 10 MG/ML injection 10 mg  10 mg Other Once Landis Martins, DPM        ALLERGIES: No Known Allergies  FAMILY HISTORY: Family History  Problem Relation Age of Onset   Migraines Mother    Hypertension Mother    Parkinsonism Father    Parkinson's disease Father    Parkinsonism Sister    Parkinson's disease Sister    Migraines Brother    Diabetes Maternal Aunt    Hypertension Maternal Aunt    Stroke Maternal Aunt    Diabetes Maternal Uncle    Hypertension Maternal Uncle    Hypertension Paternal Aunt     Hypertension Paternal Uncle    Cancer Maternal Grandmother        breast   Diabetes Maternal Grandmother    Hypertension Maternal Grandmother    Heart disease Maternal Grandfather    Hypertension Maternal Grandfather    Hypertension Paternal Grandmother    Hypertension Paternal Grandfather    Premature birth Daughter    Asthma Daughter    Parkinsonism Cousin    Parkinson's disease Cousin    Colon cancer Neg Hx    Colon polyps Neg Hx       Objective:  *** General: No acute distress.  Patient appears ***-groomed.   Head:  Normocephalic/atraumatic Eyes:  Fundi examined but not visualized Neck: supple, no paraspinal tenderness, full range of motion Heart:  Regular rate and rhythm Lungs:  Clear to auscultation bilaterally Back: No paraspinal tenderness Neurological Exam: alert and oriented to person, place, and time.  Speech fluent and not dysarthric, language intact.  CN II-XII intact. Bulk and tone normal, muscle strength 5/5 throughout.  Sensation to light touch intact.  Deep tendon reflexes 2+ throughout, toes downgoing.  Finger to nose testing intact.  Gait normal, Romberg negative.   Quita Skye  Tomi Likens, DO  CC: ***

## 2022-03-25 ENCOUNTER — Ambulatory Visit: Payer: Medicaid Other | Admitting: Neurology

## 2022-03-25 ENCOUNTER — Encounter: Payer: Self-pay | Admitting: Neurology

## 2022-03-30 ENCOUNTER — Ambulatory Visit: Payer: Medicaid Other | Admitting: Gastroenterology

## 2022-06-15 ENCOUNTER — Ambulatory Visit (INDEPENDENT_AMBULATORY_CARE_PROVIDER_SITE_OTHER): Payer: 59 | Admitting: Obstetrics & Gynecology

## 2022-06-15 ENCOUNTER — Encounter: Payer: Self-pay | Admitting: Obstetrics & Gynecology

## 2022-06-15 VITALS — BP 115/81 | HR 86 | Ht 69.0 in | Wt 196.0 lb

## 2022-06-15 DIAGNOSIS — Z3201 Encounter for pregnancy test, result positive: Secondary | ICD-10-CM | POA: Diagnosis not present

## 2022-06-15 LAB — POCT URINE PREGNANCY: Preg Test, Ur: POSITIVE — AB

## 2022-06-15 NOTE — Progress Notes (Signed)
Patient ID: Linda Reid, female   DOB: August 15, 1986, 36 y.o.   MRN: [id number] Follow up appointment: was to get nexplanon but +UPT(faint)  Chief Complaint  Patient presents with   Contraception    Nexplanon insertion    Blood pressure 115/81, pulse 86, height '5\' 9"'$  (1.753 m), weight 196 lb (88.9 kg).   Had early pregnancy termination with medication, was 7 weeks or so,  last week of September Her HCG today is positive, could be from the terminated pregnancy, waning HCG  So she will wait 2 weeks check a test at home, if negative will reschedule the nexplanon insertion  MEDS ordered this encounter: No orders of the defined types were placed in this encounter.   Orders for this encounter: Orders Placed This Encounter  Procedures   POCT urine pregnancy    Impression + Management Plan   ICD-10-CM   1. Positive pregnancy test  Z32.01 POCT urine pregnancy   repeat in 2 weeks, then nexplanon if negative here      Follow Up: Return if symptoms worsen or fail to improve, for will follow up based on instructions.     All questions were answered.  Past Medical History:  Diagnosis Date   Anxiety    Arthritis    lower back, right leg   Back pain    Cerebrospinal fluid leak from spinal puncture 03/03/2016   after epidural steroid injection; states has had 2 blood patch procedures    Difficulty swallowing solids    certain foods   GERD (gastroesophageal reflux disease)    Hyperthyroidism    Migraines    Nasal congestion 12/06/2016   Neck pain of over 3 months duration    posterior and right side   Neuropathy    right leg   Restless leg syndrome    Thyroid nodule 11/2016   right   Tracheal deviation    due to thyroid nodule, per pt.   Urinary frequency     Past Surgical History:  Procedure Laterality Date   BIOPSY  12/23/2021   Procedure: BIOPSY;  Surgeon: Daneil Dolin, MD;  Location: [facility];  Service: Endoscopy;;   DG MYLEOGRAM LUMBAR SPINE  ([place] HX)     DILATION AND CURETTAGE OF UTERUS  06/25/2002   ESOPHAGEAL MANOMETRY N/A 05/09/2019   normal relaxation of the EG junction, no significant esophageal peristaltic abnormality detected on study   ESOPHAGOGASTRODUODENOSCOPY (EGD) WITH PROPOFOL N/A 08/28/2018   mild Schatzki ring s/p dilation, normal stomach and duodenum   ESOPHAGOGASTRODUODENOSCOPY (EGD) WITH PROPOFOL N/A 12/24/2019    normal esophagus s/p dilation, normal stomach, normal duodenal bulb and second portion of duodenum.    ESOPHAGOGASTRODUODENOSCOPY (EGD) WITH PROPOFOL N/A 12/23/2021   Procedure: ESOPHAGOGASTRODUODENOSCOPY (EGD) WITH PROPOFOL;  Surgeon: Daneil Dolin, MD;  Location: [facility];  Service: Endoscopy;  Laterality: N/A;  1:30pm   MALONEY DILATION N/A 08/28/2018   Procedure: [name] DILATION;  Surgeon: Daneil Dolin, MD;  Location: [facility];  Service: Endoscopy;  Laterality: N/A;   MALONEY DILATION N/A 12/24/2019   Procedure: [name] DILATION;  Surgeon: Daneil Dolin, MD;  Location: [facility];  Service: Endoscopy;  Laterality: N/A;   MALONEY DILATION N/A 12/23/2021   Procedure: [name] DILATION;  Surgeon: Daneil Dolin, MD;  Location: [facility];  Service: Endoscopy;  Laterality: N/A;   THYROIDECTOMY Right 12/13/2016   Procedure: RIGHT HEMI-THYROIDECTOMY;  Surgeon: Leta Baptist, MD;  Location: [place];  Service: ENT;  Laterality: Right;   THYROIDECTOMY  07/02/2020   THYROIDECTOMY N/A 07/02/2020   Procedure: COMPLETION THYROIDECTOMY;  Surgeon: Leta Baptist, MD;  Location: [facility];  Service: ENT;  Laterality: N/A;   TONSILLECTOMY      OB History     Gravida  6   Para  4   Term  1   Preterm  3   AB  2   Living  4      SAB  2   IAB  0   Ectopic  0   Multiple  0   Live Births  4           No Known Allergies  Social History   Socioeconomic History   Marital status: Legally Separated    Spouse name: Psychiatrist    Number of children: 4   Years of education:  4 Bachelor   Highest education level: Not on file  Occupational History   Occupation: Print production planner: [employer]    Comment: Left position when pregnant  Tobacco Use   Smoking status: Never   Smokeless tobacco: Never  Vaping Use   Vaping Use: Never used  Substance and Sexual Activity   Alcohol use: No   Drug use: No   Sexual activity: Yes    Birth control/protection: None  Other Topics Concern   Not on file  Social History Narrative   Patient lives with her children.    Her fiance is the father of her current pregnancy, other three children were fathered by her previous partner.    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     Caffeine use: none   Right-handed   Social Determinants of Health   Financial Resource Strain: Medium Risk (06/15/2022)   Overall Financial Resource Strain (CARDIA)    Difficulty of Paying Living Expenses: Somewhat hard  Food Insecurity: No Food Insecurity (06/15/2022)   Hunger Vital Sign    Worried About Running Out of Food in the Last Year: Never true    Ran Out of Food in the Last Year: Never true  Transportation Needs: No Transportation Needs (06/15/2022)   PRAPARE - Hydrologist (Medical): No    Lack of Transportation (Non-Medical): No  Physical Activity: Insufficiently Active (06/15/2022)   Exercise Vital Sign    Days of Exercise per Week: 1 day    Minutes of Exercise per Session: 40 min  Stress: Stress Concern Present (06/15/2022)   Loyal    Feeling of Stress : Rather much  Social Connections: Moderately Isolated (06/15/2022)   Social Connection and Isolation Panel [NHANES]    Frequency of Communication with Friends and Family: Twice a week    Frequency of Social Gatherings with Friends and Family: Once a week    Attends  Religious Services: Never    Marine scientist or Organizations: Yes    Attends Music therapist: 1 to 4 times per year    Marital Status: Separated    Family History  Problem Relation Age of Onset   Migraines Mother    Hypertension Mother    Parkinsonism Father    Parkinson's disease Father    Parkinsonism Sister    Parkinson's disease Sister    Migraines Brother    Diabetes Maternal Aunt    Hypertension  Maternal Aunt    Stroke Maternal Aunt    Diabetes Maternal Uncle    Hypertension Maternal Uncle    Hypertension Paternal Aunt    Hypertension Paternal Uncle    Cancer Maternal Grandmother        breast   Diabetes Maternal Grandmother    Hypertension Maternal Grandmother    Heart disease Maternal Grandfather    Hypertension Maternal Grandfather    Hypertension Paternal Grandmother    Hypertension Paternal Grandfather    Premature birth Daughter    Asthma Daughter    Parkinsonism Cousin    Parkinson's disease Cousin    Colon cancer Neg Hx    Colon polyps Neg Hx
